# Patient Record
Sex: Female | Born: 1950 | Race: White | Hispanic: No | Marital: Married | Smoking: Never smoker
Health system: Southern US, Community
[De-identification: ages and names within clinical notes are randomized; demographics above are authoritative.]

## PROBLEM LIST (undated history)

## (undated) DIAGNOSIS — I2510 Atherosclerotic heart disease of native coronary artery without angina pectoris: Secondary | ICD-10-CM

## (undated) DIAGNOSIS — M1990 Unspecified osteoarthritis, unspecified site: Secondary | ICD-10-CM

## (undated) DIAGNOSIS — I10 Essential (primary) hypertension: Secondary | ICD-10-CM

## (undated) DIAGNOSIS — E7800 Pure hypercholesterolemia, unspecified: Secondary | ICD-10-CM

## (undated) DIAGNOSIS — R51 Headache: Secondary | ICD-10-CM

## (undated) DIAGNOSIS — I219 Acute myocardial infarction, unspecified: Secondary | ICD-10-CM

## (undated) DIAGNOSIS — M545 Low back pain, unspecified: Secondary | ICD-10-CM

## (undated) DIAGNOSIS — K219 Gastro-esophageal reflux disease without esophagitis: Secondary | ICD-10-CM

## (undated) DIAGNOSIS — G8929 Other chronic pain: Secondary | ICD-10-CM

## (undated) DIAGNOSIS — R519 Headache, unspecified: Secondary | ICD-10-CM

## (undated) DIAGNOSIS — Z87442 Personal history of urinary calculi: Secondary | ICD-10-CM

## (undated) DIAGNOSIS — L309 Dermatitis, unspecified: Secondary | ICD-10-CM

## (undated) DIAGNOSIS — C44311 Basal cell carcinoma of skin of nose: Secondary | ICD-10-CM

## (undated) HISTORY — PX: BASAL CELL CARCINOMA EXCISION: SHX1214

## (undated) HISTORY — PX: COLONOSCOPY: SHX174

## (undated) HISTORY — PX: DILATION AND CURETTAGE OF UTERUS: SHX78

## (undated) HISTORY — PX: WISDOM TOOTH EXTRACTION: SHX21

## (undated) HISTORY — PX: VAGINAL HYSTERECTOMY: SUR661

---

## 1989-12-05 HISTORY — PX: BREAST DUCTAL SYSTEM EXCISION: SHX5242

## 1994-12-05 HISTORY — PX: CARPAL TUNNEL RELEASE: SHX101

## 2000-09-28 ENCOUNTER — Encounter: Admission: RE | Admit: 2000-09-28 | Discharge: 2000-09-28 | Payer: Self-pay | Admitting: Obstetrics and Gynecology

## 2000-09-28 ENCOUNTER — Encounter: Payer: Self-pay | Admitting: Obstetrics and Gynecology

## 2000-12-05 HISTORY — PX: CYST EXCISION: SHX5701

## 2001-10-08 ENCOUNTER — Encounter: Admission: RE | Admit: 2001-10-08 | Discharge: 2001-10-08 | Payer: Self-pay | Admitting: Obstetrics and Gynecology

## 2001-10-08 ENCOUNTER — Encounter: Payer: Self-pay | Admitting: Obstetrics and Gynecology

## 2001-12-26 ENCOUNTER — Encounter (INDEPENDENT_AMBULATORY_CARE_PROVIDER_SITE_OTHER): Payer: Self-pay | Admitting: Specialist

## 2001-12-26 ENCOUNTER — Ambulatory Visit (HOSPITAL_BASED_OUTPATIENT_CLINIC_OR_DEPARTMENT_OTHER): Admission: RE | Admit: 2001-12-26 | Discharge: 2001-12-26 | Payer: Self-pay | Admitting: Orthopedic Surgery

## 2002-07-09 ENCOUNTER — Encounter (INDEPENDENT_AMBULATORY_CARE_PROVIDER_SITE_OTHER): Payer: Self-pay | Admitting: *Deleted

## 2002-07-09 ENCOUNTER — Observation Stay (HOSPITAL_COMMUNITY): Admission: RE | Admit: 2002-07-09 | Discharge: 2002-07-10 | Payer: Self-pay | Admitting: Obstetrics and Gynecology

## 2003-04-02 ENCOUNTER — Encounter: Payer: Self-pay | Admitting: Obstetrics and Gynecology

## 2003-04-02 ENCOUNTER — Encounter: Admission: RE | Admit: 2003-04-02 | Discharge: 2003-04-02 | Payer: Self-pay | Admitting: Obstetrics and Gynecology

## 2003-10-08 ENCOUNTER — Ambulatory Visit (HOSPITAL_COMMUNITY): Admission: RE | Admit: 2003-10-08 | Discharge: 2003-10-08 | Payer: Self-pay | Admitting: Gastroenterology

## 2003-10-08 ENCOUNTER — Encounter (INDEPENDENT_AMBULATORY_CARE_PROVIDER_SITE_OTHER): Payer: Self-pay | Admitting: Specialist

## 2004-04-07 ENCOUNTER — Ambulatory Visit (HOSPITAL_COMMUNITY): Admission: RE | Admit: 2004-04-07 | Discharge: 2004-04-07 | Payer: Self-pay | Admitting: Obstetrics and Gynecology

## 2004-12-28 ENCOUNTER — Ambulatory Visit (HOSPITAL_COMMUNITY): Admission: RE | Admit: 2004-12-28 | Discharge: 2004-12-28 | Payer: Self-pay | Admitting: Gastroenterology

## 2004-12-28 ENCOUNTER — Encounter (INDEPENDENT_AMBULATORY_CARE_PROVIDER_SITE_OTHER): Payer: Self-pay | Admitting: *Deleted

## 2005-04-25 ENCOUNTER — Ambulatory Visit (HOSPITAL_COMMUNITY): Admission: RE | Admit: 2005-04-25 | Discharge: 2005-04-25 | Payer: Self-pay | Admitting: Obstetrics and Gynecology

## 2005-10-07 ENCOUNTER — Encounter: Admission: RE | Admit: 2005-10-07 | Discharge: 2005-10-07 | Payer: Self-pay | Admitting: Internal Medicine

## 2005-10-21 ENCOUNTER — Encounter: Admission: RE | Admit: 2005-10-21 | Discharge: 2005-10-21 | Payer: Self-pay | Admitting: Internal Medicine

## 2006-04-04 HISTORY — PX: CYSTOSCOPY W/ STONE MANIPULATION: SHX1427

## 2006-11-14 ENCOUNTER — Ambulatory Visit (HOSPITAL_COMMUNITY): Admission: RE | Admit: 2006-11-14 | Discharge: 2006-11-14 | Payer: Self-pay | Admitting: Obstetrics and Gynecology

## 2007-03-10 ENCOUNTER — Emergency Department (HOSPITAL_COMMUNITY): Admission: EM | Admit: 2007-03-10 | Discharge: 2007-03-10 | Payer: Self-pay | Admitting: Emergency Medicine

## 2007-04-13 ENCOUNTER — Ambulatory Visit (HOSPITAL_BASED_OUTPATIENT_CLINIC_OR_DEPARTMENT_OTHER): Admission: RE | Admit: 2007-04-13 | Discharge: 2007-04-13 | Payer: Self-pay | Admitting: Urology

## 2007-09-02 ENCOUNTER — Emergency Department (HOSPITAL_COMMUNITY): Admission: EM | Admit: 2007-09-02 | Discharge: 2007-09-02 | Payer: Self-pay | Admitting: Family Medicine

## 2008-01-09 ENCOUNTER — Ambulatory Visit (HOSPITAL_COMMUNITY): Admission: RE | Admit: 2008-01-09 | Discharge: 2008-01-09 | Payer: Self-pay | Admitting: Obstetrics and Gynecology

## 2010-12-25 ENCOUNTER — Encounter: Payer: Self-pay | Admitting: Internal Medicine

## 2011-04-22 NOTE — H&P (Signed)
NAME:  [name] [name], [name] [name]                         ACCOUNT NO.:  [id number]   MEDICAL RECORD NO.:  [id number]                   PATIENT TYPE:  INP   LOCATION:  0462                                 FACILITY:  [facility]   PHYSICIAN:  Katherine Roan, M.D.               DATE OF BIRTH:  11-Oct-1951   DATE OF ADMISSION:  07/09/2002  DATE OF DISCHARGE:  07/10/2002                                HISTORY & PHYSICAL   PREOPERATIVE DIAGNOSIS:  Continued heavy painful periods.   HISTORY OF PRESENT ILLNESS:  The patient is a 60 year old gravida 2, para 2  female who has had trouble with heavy painful periods despite oral  contraceptives and has been advised to go off her birth control pills  because of hypertensive issues.  In addition to that, she is on Darvon and  Lipitor.   PAST SURGICAL HISTORY:  1. Two normal deliveries.  2. Cyst removed from a finger.  3. Carpal tunnel.  4. Back several years ago she had a diagnostic laparoscopy.   ALLERGIES:  SULFA.   REVIEW OF SYSTEMS:  She wears glasses but notes no dizziness, no decrease in  visual or auditory acuity.  No headaches.  HEART:  She denies chest pain,  shortness of breath.  No history of mitral valve prolapse or heart murmur.  She has been followed for hypertension.  LUNGS:  No chronic cough.  No  asthma.  No hemoptysis.  GENITOURINARY:  She denies stress incontinence.  No  history of UTIs.  No frequency.  GASTROINTESTINAL:  No bowel habit change.  No anorexia.  No melena.  MUSCLES, BONES, AND JOINTS:  She was treated for  carpal tunnel on the right.  Is doing very well.   SOCIAL HISTORY:  She works for [employer].  Denies  smoking or drinking or alcohol intake.   FAMILY HISTORY:  Her mother is living and well at 63.  Her father died from  heart disease at age 91.  She has two brothers who are living and well.  Her  maternal grandfather has lung cancer, and a maternal aunt had malignancy in  the bone.   PHYSICAL  EXAMINATION:  GENERAL:  Well-developed, nourished female who is  alert, oriented to time, place and recent events.  Appears to be her stated  age of 80.  VITAL SIGNS:  Weight is 189.  Blood pressure 140/80.  HEENT:  Examination of the ears, nose, and throat unremarkable.  Oropharynx  is not injected.  NECK:  Supple.  Carotid pulses are equal, without bruits.  Thyroid is in the  midline.  LUNGS:  Clear to P&A.  BREASTS:  No masses or tenderness.  ABDOMEN:  Soft and flat.  Liver, spleen, and kidneys are not palpated.  Bowel sounds are normal.  No bruits are heard.  PELVIC:  Normal vulva and vagina.  Cervix is clean.  Uterus is anterior.  Adnexa  negative, without masses.  Pap smear are benign.  RECTOVAGINAL:  Confirms.  EXTREMITIES:  Show good range of motion.  Equal pulses and reflexes.   IMPRESSION:  Persistent menorrhagia and dysmenorrhea.   PLAN:  Vaginal hysterectomy, possible abdominal.  Risks, benefits have been  discussed with the patient at length.                                               Katherine Roan, M.D.    SDM/MEDQ  D:  07/08/2002  T:  07/12/2002  Job:  [id number]

## 2011-04-22 NOTE — Op Note (Signed)
[name] [name], [name] [name]               ACCOUNT NO.:  [id number]   MEDICAL RECORD NO.:  [id number]          PATIENT TYPE:  AMB   LOCATION:  NESC                         FACILITY:  [facility]   PHYSICIAN:  Mark C. Vernie Ammons, M.D.  DATE OF BIRTH:  [birth year]   DATE OF PROCEDURE:  04/13/2007  DATE OF DISCHARGE:                               OPERATIVE REPORT   PREOPERATIVE DIAGNOSIS:  Left ureteral calculus.   POSTOPERATIVE DIAGNOSIS:  Left ureteral calculus.   PROCEDURE:  Cystoscopy, left retrograde pyelogram with interpretation,  left ureteroscopy and stone extraction with double-J stent placement.   SURGEON:  Mark C. Vernie Ammons, M.D.   ANESTHESIA:  General.   SPECIMENS:  Stone given to the patient.   DRAINS:  4.8 French 24 cm contour stent with string affixed.   BLOOD LOSS:  None.   COMPLICATIONS:  None.   INDICATIONS:  The patient is a 60 year old white female with a left  distal ureteral stone that has failed to progress.  She is brought to  the OR for ureteroscopic extraction.  We discussed the risks,  complications and alternatives.   DESCRIPTION OF OPERATION:  After informed consent, the patient was taken  to the major OR, placed on the table and identified.  It was confirmed  the procedure was to be performed on the left side and she was then  administered general anesthesia and moved to the dorsal lithotomy  position.  Her genitalia was sterilely prepped and draped and a 22-  French cystoscope with 12 degrees lens was inserted in the bladder.  The  bladder was fully inspected and noted to be free of any tumor, stones,  or inflammatory lesions.  The left orifice was then identified.   A 6-French open ended ureteral catheter was then passed in the left  orifice and retrograde pyelogram was performed in standard fashion using  full strength contrast injected through the open ended catheter.  This  was performed under direct fluoroscopy and revealed a filling defect in  the distal  ureter consistent with a stone. Proximal to this, the ureter  appeared normal without lesion or filling defect.  The intrarenal  collecting system was also noted to be normal with no mass effect or  filling defect.   A 6-French rigid ureteroscope was then passed into the bladder under  direct visualization and into the left ureteral orifice.  The stone was  visualized, grasped with nitinol basket, and extracted without  difficulty.  The cystoscope was reinserted and a 0.038 inch floppy tip  guidewire was then passed up the left ureter under direct fluoroscopic  control into the renal pelvis and the stent was passed over the  guidewire.  The guidewire was removed with a good curl being noted in  the renal pelvis and in the bladder.  The bladder was drained, the  cystoscope removed, and the string on the distal aspect of the stent  affixed to the patient's inner thigh.  She was awakened and taken to the  recovery room in stable satisfactory condition.  She tolerated the  procedure well with no intraoperative complications.  She will be given a prescription for Vicoprofen #30 and Vesicare 10 mg  #5.  She will follow-up in my office in four days for stent removal.      Mark C. Vernie Ammons, M.D.  Electronically Signed     MCO/MEDQ  D:  04/13/2007  T:  04/13/2007  Job:  [id number]

## 2011-04-22 NOTE — Discharge Summary (Signed)
   NAMEDORCAS, MELITO                           ACCOUNT NO.:  [id number]   MEDICAL RECORD NO.:  [id number]                    PATIENT TYPE:   LOCATION:                                       FACILITY:   PHYSICIAN:  Katherine Roan, M.D.               DATE OF BIRTH:   DATE OF ADMISSION:  07/10/2002  DATE OF DISCHARGE:  07/12/2002                                 DISCHARGE SUMMARY   ADMISSION DIAGNOSIS:  Severe dysmenorrhea.   POSTOPERATIVE DIAGNOSES:  1. Severe dysmenorrhea.  2. Adenomyosis.   HISTORY OF PRESENT ILLNESS:  The patient is a 60 year old female who has  trouble with heavy painful periods.  She had been fairly well controlled on  oral contraceptives.  However, because of her blood pressure, she was  advised to have these removed.   She was also on Darvon and Lipitor.  She had a history of diagnostic  laparoscopy.   PHYSICAL EXAMINATION:  General physical examination was done on admission  and found to be within normal limits.   LABORATORY DATA:  Admission hemoglobin was 13.  Creatinine and BUN were  normal.   HOSPITAL COURSE:  The patient was admitted to the hospital and underwent an  uneventful vaginal hysterectomy.  The following day was discharged to home  and office care.  She was asked to call for fever, bleeding, or any other  problems she encountered.   CONDITION ON DISCHARGE:  Improved.                                                Katherine Roan, M.D.    SDM/MEDQ  D:  08/11/2002  T:  08/11/2002  Job:  [id number]

## 2011-04-22 NOTE — Op Note (Signed)
[name], [name]               ACCOUNT NO.:  [id number]   MEDICAL RECORD NO.:  [id number]          PATIENT TYPE:  AMB   LOCATION:  ENDO                         FACILITY:  [facility]   PHYSICIAN:  Petra Kuba, M.D.    DATE OF BIRTH:  07/24/1951   DATE OF PROCEDURE:  12/28/2004  DATE OF DISCHARGE:                                 OPERATIVE REPORT   PROCEDURE PERFORMED:  Colonoscopy with polypectomy.   ENDOSCOPIST:  Petra Kuba, M.D.   INDICATIONS FOR PROCEDURE:  Patient with history of significant polyp, due  for repeat screening.  Consent was signed after the risks, benefits, methods  and options were thoroughly discussed with the patient on multiple times in  the past in the office.   MEDICINES USED:  Demerol 100 mg, Versed 10 mg.   DESCRIPTION OF PROCEDURE:  Rectal inspection was pertinent for external  hemorrhoids, small.  Digital exam was negative.  A video pediatric  adjustable colonoscope was inserted and with minimal difficulty due to some  looping with abdominal pressure, was able to be advanced around the colon to  the cecum.  On insertion, no obvious abnormalities were seen.  The cecum was  identified by the appendiceal orifice and the ileocecal valve.  The prep was  adequate.  There was some liquid stool that required washing and suctioning.  On slow withdrawal through the colon, the cecum, ascending, transverse and  descending were normal.  As the scope was withdrawn around the left side of  the colon, in the distal sigmoid and rectum, three tiny probable  hyperplastic appearing polyps were seen and were all hot biopsied times one  and put in the first container.  In the distal sigmoid there was some  erythema, probably her previous polypectomy site and a few cold biopsies of  that were obtained and put in a second container.  Once back in the rectum,  anorectal pull-through and retroflexion confirmed some small hemorrhoids.  Scope was straightened and readvanced a  short ways up the left side of the  colon, air was suctioned, scope removed.  The patient tolerated the  procedure well.  There was no immediate obvious complication.   ENDOSCOPIC DIAGNOSIS:  1.  Internal and external small hemorrhoids.  2.  Three tiny rectal and distal sigmoid polyps hot biopsied.  3.  Distal sigmoid erythema, probably her old polypectomy site, status post      cold biopsy.  4.  Otherwise within normal limits to the cecum.   PLAN:  Await pathology to determine future colonic screening.  Otherwise  happy to see back p.r.n. and return care to Dr. Felipa Eth for the customary  health care maintenance to include yearly rectals and guaiacs.      MEM/MEDQ  D:  12/28/2004  T:  12/28/2004  Job:  [id number]   cc:   Larina Earthly, M.D.  [address]  [place]  [place] [address]  Fax: [phone]

## 2011-04-22 NOTE — Op Note (Signed)
NAME:  [name] [name], [name] [name]                         ACCOUNT NO.:  [id number]   MEDICAL RECORD NO.:  [id number]                   PATIENT TYPE:  AMB   LOCATION:  ENDO                                 FACILITY:  [facility]   PHYSICIAN:  Petra Kuba, M.D.                 DATE OF BIRTH:  05/27/1951   DATE OF PROCEDURE:  10/08/2003  DATE OF DISCHARGE:                                 OPERATIVE REPORT   PROCEDURE:  Colonoscopy with polypectomy.   INDICATIONS FOR PROCEDURE:  Guaiac positivity, family history of colon  polyp. Consent was signed after risks, benefits, methods, and options were  thoroughly discussed in the office.   MEDICINES USED:  Demerol 100, Versed 10.   DESCRIPTION OF PROCEDURE:  Rectal inspection was pertinent for small  external hemorrhoids. Digital exam was negative. The pediatric video  adjustable colonoscope was inserted. There was a little blood in the distal  colon and in the mid sigmoid a large pedunculated polyp was seen. We were  able to advance the scope around this and with abdominal pressure advance to  the cecum. No blood was seen above the polyp. The cecum was identified by  the appendiceal orifice and the ileocecal valve. In fact, the scope was  inserted a short ways into the terminal ileum which was normal. Photo  documentation was obtained.  No blood was seen in the TI, the scope was  slowly withdrawn. The prep was adequate. There was some liquid stool that  required washing and suctioning. The cecum, ascending and transverse were  all normal. In the mid descending, a tiny polyp was seen and was hot  biopsied x1. The scope was then withdrawn back to the mid sigmoid where the  large pedunculated polyp was seen, snared, electrocautery applied and the  polyp was removed. We went ahead and resnared the polyp and it was withdrawn  through the rectum. The scope was reinserted back up the sigmoid into the  polypectomy site and possibly there was some polypoid tissue  left on the  stalk and the stalk and residual polyp was resnared, electrocautery applied  and this part of the stalk and polyp was removed, suctioned onto the head of  the scope and this was recovered as well.  The scope was reinserted. Again  there was a little erythema on the stalk, I do not think this was residual  polyp but we went ahead and took two hot biopsies of this area and put it in  a separate container to make sure there was no residual polypoid tissue. The  scope was then slowly withdrawn. No additional findings were seen as we  slowly withdrew back to the rectum. Anal rectal pull through and  retroflexion confirmed some small hemorrhoids. The scope was reinserted pass  the polypectomy site a short ways up the left side of the colon, air was  suctioned, scope removed. The patient  tolerated the procedure well. There  was no obvious or immediate complications.   ENDOSCOPIC DIAGNOSIS:  1. Internal and external hemorrhoids.  2. Large pedunculated hemorrhagic polyp status post two snares and hot     biopsy of the stalk to rule out any residual polyp.  3. Tiny descending polyp hot biopsied.  4. Otherwise within normal limits to the terminal ileum.    PLAN:  Await pathology to determine future colonic screening. Will go ahead  and repeat guaiacs in one month and decide further workup and plans at that  juncture and see her back probably in two months after guaiacs to recheck  symptoms and make sure no further workup plans are needed.                                               Petra Kuba, M.D.    MEM/MEDQ  D:  10/08/2003  T:  10/08/2003  Job:  [id number]   cc:   S. Kyra Manges, M.D.  [phone]  [place]  [place] [address]  Fax: [phone]

## 2011-04-22 NOTE — Op Note (Signed)
   TNAMESHER, SHAMPINE                        ACCOUNT NO.:  [id number]   MEDICAL RECORD NO.:  [id number]                   PATIENT TYPE:  INP   LOCATION:  NA                                   FACILITY:  [facility]   PHYSICIAN:  Katherine Roan, M.D.               DATE OF BIRTH:  1951/07/16   DATE OF PROCEDURE:  07/09/2002  DATE OF DISCHARGE:                                 OPERATIVE REPORT   OPERATION PERFORMED:  Vaginal hysterectomy.  Pelvic exam under anesthesia.   PREOPERATIVE DIAGNOSIS:  Persistent menorrhagia and dysmenorrhea.   POSTOPERATIVE DIAGNOSIS:  Persistent menorrhagia and dysmenorrhea.   DESCRIPTION OF PROCEDURE:  The patient was placed in lithotomy position with  candy-cane stirrups.  Examination under anesthesia revealed an anterior  normal-size uterus.  There were no pelvic masses.  The patient was then  prepped and draped in the usual fashion, cervix grasped with a tenaculum.  It was quite well-supported.  Cul-de-sac posteriorly was entered with sharp  dissection.  The uterosacral and cardinals were then skeletonized and  ligated with 0 chromic.  The anterior vagina was then incised, and the  uterine vessels were clamped.  Following this, the vesicouterine peritoneum  was identified and entered and the upper broad ligament, including the round  ligament on each side, were clamped and ligated with 0 chromic suture.  Hemostasis was secure.  The specimen was retroverted, and the uteroovarian  anastomosis and tube were clamped and ligated with 0 chromic tie and suture.  There was a bleeder in the left side of the broad ligament which was clamped  with a Heaney clamp and ligated.  Hemostasis was secure from that  standpoint.  We then plicated the uterosacral ligaments in the midline for  vault support and closed the vagina with a continuation of the peritoneal  suture.  Peritoneal suture was closed with 2-0 PDS pursestring type.  Following this, the vagina was opposed as  well.  The posterior vagina and  uterosacral ligaments all were closed in the vertical plane with figure-of-  eight sutures of 0 Vicryl.  Hemostasis was quite secure.  Bladder was  drained of about 150 cc of slightly concentrated but clear urine.  No  unusual blood loss occurred at the time of the procedure.  Estimated blood  loss was about 100-200 cc.  The patient tolerated the procedure well.                                               Katherine Roan, M.D.    SDM/MEDQ  D:  07/09/2002  T:  07/12/2002  Job:  [id number]

## 2011-04-22 NOTE — Op Note (Signed)
[place]  Patient:    [name] [name], [name] [name] Visit Number: [id number] MRN: [id number]          Service Type: DSU Location: [facility] Attending Physician:  Ronne Binning Dictated by:   Nicki Reaper, M.D. Proc. Date: 12/26/01 Admit Date:  12/26/2001                             Operative Report  PREOPERATIVE DIAGNOSIS:  Mucoid cyst, left index finger.  POSTOPERATIVE DIAGNOSIS:  Mucoid cyst, left index finger.  OPERATION:  Excision mucoid cyst, left index finger; debridement distal interphalangeal joint.  SURGEON:   Nicki Reaper, M.D.  ANESTHESIA:  IV regional forearm-based.  DATE OF OPERATION:  December 26, 2001  ANESTHESIOLOGIST:  Maren Beach, M.D.  HISTORY:  The patient is a 60 year old female with a history of a mass on the dorsal aspect of her index finger with degenerative changes in the distal interphalangeal joint.  PROCEDURE:  The patient was brought to the operating room where a forearm-based IV regional anesthetic was carried out without difficulty.  She was prepped and draped using Betadine scrub and solution with the left arm free.  A curvilinear incision was made over the mass, carried down through subcutaneous tissue, bleeders were electrocauterized.  The cyst was immediately encountered; this was multiloculated.  With blunt and sharp dissection this was dissected free.  An exostosis was present on the proximal portion of the distal phalanx and a small one on the distal portion of the middle phalanx.  This was removed with the rongeur.  A cyst was present within the bone of the distal phalanx and this was removed.  The ulnar side was then inspected.  No significant exostosis were present.  The wound was irrigated. The skin was closed with interrupted 5-0 nylon sutures.  A sterile compressive dressing and splint were applied to the finger.  The patient tolerated the procedure well and was taken to the recovery room for  observation in satisfactory condition.  DISPOSITION:  She is discharged home to return to [facility] [place] in one week on Vicodin and Keflex. Dictated by:   Nicki Reaper, M.D. Attending Physician:  Ronne Binning DD:  12/26/01 TD:  12/26/01 Job: [id number]

## 2012-01-18 ENCOUNTER — Other Ambulatory Visit: Payer: Self-pay | Admitting: Obstetrics & Gynecology

## 2012-01-18 DIAGNOSIS — R928 Other abnormal and inconclusive findings on diagnostic imaging of breast: Secondary | ICD-10-CM

## 2012-01-26 ENCOUNTER — Ambulatory Visit
Admission: RE | Admit: 2012-01-26 | Discharge: 2012-01-26 | Disposition: A | Discharge status: Home / Self Care | Payer: BC Managed Care – PPO | Source: Ambulatory Visit | Attending: Obstetrics & Gynecology | Admitting: Obstetrics & Gynecology

## 2012-01-26 DIAGNOSIS — R928 Other abnormal and inconclusive findings on diagnostic imaging of breast: Secondary | ICD-10-CM

## 2013-02-08 ENCOUNTER — Emergency Department (INDEPENDENT_AMBULATORY_CARE_PROVIDER_SITE_OTHER): Payer: BC Managed Care – PPO

## 2013-02-08 ENCOUNTER — Emergency Department (HOSPITAL_COMMUNITY)
Admission: EM | Admit: 2013-02-08 | Discharge: 2013-02-08 | Disposition: A | Discharge status: Home / Self Care | Payer: BC Managed Care – PPO | Source: Home / Self Care

## 2013-02-08 ENCOUNTER — Encounter (HOSPITAL_COMMUNITY): Payer: Self-pay

## 2013-02-08 DIAGNOSIS — J069 Acute upper respiratory infection, unspecified: Secondary | ICD-10-CM

## 2013-02-08 HISTORY — DX: Essential (primary) hypertension: I10

## 2013-02-08 HISTORY — DX: Pure hypercholesterolemia, unspecified: E78.00

## 2013-02-08 MED ORDER — AZITHROMYCIN 250 MG PO TABS: 250.0000 mg | ORAL_TABLET | Freq: Every day | ORAL | Status: DC

## 2013-02-08 NOTE — ED Provider Notes (Signed)
History     CSN: [id number]  Arrival date & time 02/08/13  1141   First MD Initiated Contact with Patient 02/08/13 1225      Chief Complaint  Patient presents with  . Nasal Congestion    (Consider location/radiation/quality/duration/timing/severity/associated sxs/prior treatment) HPI Comments: -year-old female is accompanied by her husband with complaints of copious PND, sinus congestion and tightness in her upper chest. She thought she may have felt a fever last p.m. but not taken. She is equivocal when responding to whether she has shortness of breath or not. She denies earache or sore throat. For allergies she takes Palgia (OTC antihistamine) and Astelin nasal spray twice a day. She is complaining of fatigue and malaise and feeling poorly in general.   Past Medical History  Diagnosis Date  . High cholesterol   . Hypertension   . Arthritis     Past Surgical History  Procedure Laterality Date  . Abdominal hysterectomy      History reviewed. No pertinent family history.  History  Substance Use Topics  . Smoking status: Not on file  . Smokeless tobacco: Not on file  . Alcohol Use: Not on file    OB History   Grav Para Term Preterm Abortions TAB SAB Ect Mult Living                  Review of Systems  Constitutional: Positive for activity change and fatigue. Negative for fever, chills and appetite change.  HENT: Positive for congestion, rhinorrhea, voice change and postnasal drip. Negative for ear pain, sore throat, facial swelling, neck pain and neck stiffness.   Eyes: Negative.   Respiratory: Negative.   Cardiovascular: Negative.   Gastrointestinal: Negative.   Genitourinary: Negative.   Skin: Negative for pallor and rash.  Neurological: Negative.     Allergies  Altace; Benicar; and Sulfa antibiotics  Home Medications   Current Outpatient Rx  Name  Route  Sig  Dispense  Refill  . aspirin 81 MG tablet   Oral   Take 81 mg by mouth daily.         Marland Kitchen  azelastine (ASTELIN) 137 MCG/SPRAY nasal spray   Nasal   Place 1 spray into the nose 2 (two) times daily. Use in each nostril as directed         . estradiol (ESTRACE) 2 MG tablet   Oral   Take 2 mg by mouth daily.         . fenofibrate 160 MG tablet   Oral   Take 160 mg by mouth daily.         . fish oil-omega-3 fatty acids 1000 MG capsule   Oral   Take 2 g by mouth daily.         Marland Kitchen glucosamine-chondroitin 500-400 MG tablet   Oral   Take 1 tablet by mouth 3 (three) times daily.         . meloxicam (MOBIC) 15 MG tablet   Oral   Take 15 mg by mouth daily.         Marland Kitchen omeprazole (PRILOSEC) 20 MG capsule   Oral   Take 20 mg by mouth daily.         . rosuvastatin (CRESTOR) 20 MG tablet   Oral   Take 20 mg by mouth daily.         . valsartan (DIOVAN) 160 MG tablet   Oral   Take 160 mg by mouth daily.         Marland Kitchen  azithromycin (ZITHROMAX) 250 MG tablet   Oral   Take 1 tablet (250 mg total) by mouth daily. 2 tabs po on day one, then one tablet po once daily on days 2-5.   6 tablet   0     BP 156/81  Pulse 74  Temp(Src) 99.4 F (37.4 C) (Oral)  Resp 18  SpO2 93%  Physical Exam  Nursing note and vitals reviewed. Constitutional: She is oriented to person, place, and time. She appears well-developed and well-nourished. No distress.  HENT:  Right Ear: External ear normal.  Left Ear: External ear normal.  Mouth/Throat: No oropharyngeal exudate.  OP with minor erythema and copious PND.  Eyes: Conjunctivae and EOM are normal.  Neck: Normal range of motion. Neck supple.  Cardiovascular: Normal rate, regular rhythm and normal heart sounds.   Pulmonary/Chest: Effort normal and breath sounds normal. No respiratory distress. She has no wheezes. She has no rales.  Musculoskeletal: Normal range of motion. She exhibits no edema.  Lymphadenopathy:    She has no cervical adenopathy.  Neurological: She is alert and oriented to person, place, and time.  Skin: Skin  is warm and dry. No rash noted.  Psychiatric: She has a normal mood and affect.    ED Course  Procedures (including critical care time)  Labs Reviewed - No data to display Dg Chest 2 View  02/08/2013  *RADIOLOGY REPORT*  Clinical Data: Cough, dyspnea, low oxygen saturation, history hypertension  CHEST - 2 VIEW  Comparison: None  Findings: Borderline enlargement of cardiac silhouette. Mediastinal contours and pulmonary vascularity normal. Lungs clear. No pleural effusion or pneumothorax. Minimal peribronchial thickening centrally. Bones unremarkable.  IMPRESSION: Borderline enlargement of cardiac silhouette. Minimal bronchitic changes.   Original Report Authenticated By: Ulyses Southward, M.D.      1. URI (upper respiratory infection)   2. PND (post-nasal drip)   3. Bronchitis       MDM  CXR  She was advised to take Chlor-Trimeton for drainage and use nasal saline. She states that neither one of them has ever helped her before and declines to use them. She st most other antihistamines do not help Mucinex or Robitussin, Sudafed PE 10mg  prn congestion. .  Z pack Follow with your doctor next week if needed          Hayden Rasmussen, NP 02/08/13 1353

## 2013-02-08 NOTE — ED Notes (Signed)
Husband ill w URI last week, now better; pt now experiencing same syx for past few days

## 2013-02-13 NOTE — ED Provider Notes (Signed)
Medical screening examination/treatment/procedure(s) were performed by resident physician or non-physician practitioner and as supervising physician I was immediately available for consultation/collaboration.   KINDL,JAMES DOUGLAS MD.   James D Kindl, MD 02/13/13 2027 

## 2014-03-04 ENCOUNTER — Other Ambulatory Visit: Payer: Self-pay | Admitting: Urology

## 2014-03-05 ENCOUNTER — Encounter (HOSPITAL_COMMUNITY): Payer: Self-pay | Admitting: *Deleted

## 2014-03-05 HISTORY — PX: EXTRACORPOREAL SHOCK WAVE LITHOTRIPSY: SHX1557

## 2014-03-05 NOTE — Progress Notes (Signed)
Spoke to patient via phone,history obtained,updated.  Bring blue folder,insurance cards,picture ID,designated driver Reinforced no aspirin(instructions to hold aspirin per your doctor), ibuprofen products 72 hours prior to procedure.  No vitamins or herbal medicines 7 days prior to procedure.   Follow laxative instructions provided by urologist (office) and in blue folder.  Wear easy on/off clothing and no jewelry except wedding rings and ear rings. Leave all other valuables at home. Verbalizes understanding of instructions  No alcohol 24 hours prior to procedure.call md office if you have a cold,sorethroat or fever. Shower or bathe before your treatment. You may have clear liquids up to 4 hours prior to treatment,NO SOLIDS 6 hours prior to treatment.. Pt verbalized understanding

## 2014-03-07 ENCOUNTER — Encounter (HOSPITAL_COMMUNITY): Payer: Self-pay | Admitting: Pharmacy Technician

## 2014-03-10 ENCOUNTER — Ambulatory Visit (HOSPITAL_COMMUNITY): Payer: BC Managed Care – PPO

## 2014-03-10 ENCOUNTER — Encounter (HOSPITAL_COMMUNITY): Payer: Self-pay | Admitting: *Deleted

## 2014-03-10 ENCOUNTER — Encounter (HOSPITAL_COMMUNITY)
Admission: RE | Disposition: A | Discharge status: Home / Self Care | Payer: Self-pay | Source: Ambulatory Visit | Attending: Urology

## 2014-03-10 ENCOUNTER — Ambulatory Visit (HOSPITAL_COMMUNITY)
Admission: RE | Admit: 2014-03-10 | Discharge: 2014-03-10 | Disposition: A | Discharge status: Home / Self Care | Payer: BC Managed Care – PPO | Source: Ambulatory Visit | Attending: Urology | Admitting: Urology

## 2014-03-10 DIAGNOSIS — Z7982 Long term (current) use of aspirin: Secondary | ICD-10-CM | POA: Insufficient documentation

## 2014-03-10 DIAGNOSIS — Z85828 Personal history of other malignant neoplasm of skin: Secondary | ICD-10-CM | POA: Insufficient documentation

## 2014-03-10 DIAGNOSIS — N1330 Unspecified hydronephrosis: Secondary | ICD-10-CM | POA: Insufficient documentation

## 2014-03-10 DIAGNOSIS — N201 Calculus of ureter: Secondary | ICD-10-CM | POA: Insufficient documentation

## 2014-03-10 DIAGNOSIS — E7800 Pure hypercholesterolemia, unspecified: Secondary | ICD-10-CM | POA: Insufficient documentation

## 2014-03-10 DIAGNOSIS — R310 Gross hematuria: Secondary | ICD-10-CM | POA: Insufficient documentation

## 2014-03-10 DIAGNOSIS — Z79899 Other long term (current) drug therapy: Secondary | ICD-10-CM | POA: Insufficient documentation

## 2014-03-10 DIAGNOSIS — N200 Calculus of kidney: Secondary | ICD-10-CM | POA: Insufficient documentation

## 2014-03-10 DIAGNOSIS — I10 Essential (primary) hypertension: Secondary | ICD-10-CM | POA: Insufficient documentation

## 2014-03-10 DIAGNOSIS — Z90710 Acquired absence of both cervix and uterus: Secondary | ICD-10-CM | POA: Insufficient documentation

## 2014-03-10 SURGERY — LITHOTRIPSY, ESWL
Anesthesia: LOCAL | Laterality: Left

## 2014-03-10 MED ORDER — DIAZEPAM 5 MG PO TABS
10.0000 mg | ORAL_TABLET | ORAL | Status: AC
  Administered 2014-03-10: 10 mg via ORAL
  Filled 2014-03-10: qty 2

## 2014-03-10 MED ORDER — CIPROFLOXACIN HCL 500 MG PO TABS
500.0000 mg | ORAL_TABLET | ORAL | Status: AC
Start: 2014-03-10 — End: 2014-03-10
  Administered 2014-03-10: 500 mg via ORAL
  Filled 2014-03-10: qty 1

## 2014-03-10 MED ORDER — SODIUM CHLORIDE 0.9 % IV SOLN
INTRAVENOUS | Status: DC
  Administered 2014-03-10: 15:00:00 via INTRAVENOUS

## 2014-03-10 MED ORDER — OXYCODONE HCL 10 MG PO TABS: 10.0000 mg | ORAL_TABLET | ORAL | Status: DC | PRN

## 2014-03-10 MED ORDER — DIPHENHYDRAMINE HCL 25 MG PO CAPS
25.0000 mg | ORAL_CAPSULE | ORAL | Status: AC
  Administered 2014-03-10: 25 mg via ORAL
  Filled 2014-03-10: qty 1

## 2014-03-10 MED ORDER — TAMSULOSIN HCL 0.4 MG PO CAPS: 0.4000 mg | ORAL_CAPSULE | ORAL | Status: DC

## 2014-03-10 NOTE — H&P (Signed)
[name] [name] is a 63 year old female who returns for completion of her gross hematuria evaluation.   History of Present Illness Left nephrolithiasis: She was initially diagnosed with 2 nonobstructing left lower pole calculi in 1/06. She passed a small stone spontaneously in 4/08.   Left ureteroscopy and stone extraction 5/08.  Stone analysis: Calcium oxalate    Gross hematuria: She reports that for 4-5 months she has intermittent discomfort in the area of the urethra. This has been associated with some discomfort in the lower abdomen more to the right side than the left. It is not associated with any flank pain. She has however noted intermittent gross hematuria. When she was in to see her primary care physician blood was noted in her urine and was placed on an antibiotic. This resulted in resolution of her urethral discomfort but she continued to have intermittent gross hematuria as recently as last week.     Interval history: She is doing well with no new complaints and specifically denies any further gross hematuria. She is not having any flank pain.   Past Medical History Problems  1. History of Arthritis (V13.4) 2. History of hypercholesterolemia (V12.29) 3. History of hypertension (V12.59) 4. History of Skin Cancer (V10.83)  Surgical History Problems  1. History of Hysterectomy 2. History of Neuroplasty Decompression Median Nerve At Carpal Tunnel 3. History of Wrist Carpectomy  Current Meds 1. Aspirin 81 MG Oral Tablet;  Therapy: (Recorded:16Mar2015) to Recorded 2. Azelastine HCl SOLN;  Therapy: (Recorded:16Mar2015) to Recorded 3. Carbinoxamine Maleate TABS;  Therapy: (Recorded:16Mar2015) to Recorded 4. Crestor 20 MG Oral Tablet;  Therapy: (Recorded:16Mar2015) to Recorded 5. Cymbalta 30 MG Oral Capsule Delayed Release Particles;  Therapy: (Recorded:16Mar2015) to Recorded 6. Diovan 160 MG CAPS;  Therapy: (Recorded:16Mar2015) to Recorded 7. Estradiol 2 MG Oral  Tablet;  Therapy: (Recorded:16Mar2015) to Recorded 8. Fenofibrate 160 MG Oral Tablet;  Therapy: (Recorded:16Mar2015) to Recorded 9. Fish Oil 1000 MG Oral Capsule;  Therapy: (Recorded:16Mar2015) to Recorded 10. Meloxicam 15 MG Oral Tablet;   Therapy: (Recorded:16Mar2015) to Recorded 11. Multi For Her CAPS;   Therapy: (Recorded:16Mar2015) to Recorded 12. Omeprazole 20 MG Oral Capsule Delayed Release;   Therapy: (Recorded:16Mar2015) to Recorded 13. TraMADol HCl TABS;   Therapy: (Recorded:16Mar2015) to Recorded  Allergies Medication  1. Altace CAPS 2. Benicar TABS 3. Sulfa Drugs  Family History Problems  1. Family history of Deceased : Father 2. Family history of cardiac disorder (V17.49) : Father 3. Family history of Heart Disease : Father  Social History Problems  1. Denied: History of Alcohol use 2. Denied: History of Caffeine use 3. Marital History - Currently Married 4. Never smoker 5. Occupation:   Statistician 6. Two children   Review of Systems Genitourinary, constitutional, skin, eye, otolaryngeal, hematologic/lymphatic, cardiovascular, pulmonary, endocrine, musculoskeletal, gastrointestinal, neurological and psychiatric system(s) were reviewed and pertinent findings if present are noted.  Constitutional: feeling tired (fatigue).  ENT: sinus problems.  Musculoskeletal: back pain and joint pain.    Vitals Vital Signs surgery1 BSA Calculated: 1.91 Blood Pressure: 159 / 78 Heart Rate: 65  Physical Exam Constitutional: Well nourished and well developed . No acute distress.  ENT:. The ears and nose are normal in appearance.  Neck: The appearance of the neck is normal and no neck mass is present.  Pulmonary: No respiratory distress and normal respiratory rhythm and effort.  Cardiovascular: Heart rate and rhythm are normal . No peripheral edema.  Abdomen: The abdomen is soft and nontender. No masses are palpated. No CVA  tenderness. No hernias are palpable. No  hepatosplenomegaly noted.  Lymphatics: The femoral and inguinal nodes are not enlarged or tender.  Skin: Normal skin turgor, no visible rash and no visible skin lesions.  Neuro/Psych:. Mood and affect are appropriate.   AU CT-HEMATURIA PROTOCOL [id number] 12:00AM Kathie Rhodes  Test Name Result Flag Reference AU CT-HEMATURIA PROTOCOL (Report)   ** RADIOLOGY REPORT BY [facility] **   CLINICAL DATA: Gross hematuria. History of renal stones.  EXAM: CT ABDOMEN AND PELVIS WITHOUT AND WITH CONTRAST  TECHNIQUE: Multidetector CT imaging of the abdomen and pelvis was performed without contrast material in one or both body regions, followed by contrast material(s) and further sections in one or both body regions.  CONTRAST: 125 cc Isovue 300.  COMPARISON: CT UROGRAM/STONE PROTOCOL W/O dated 04/03/2007  FINDINGS: Lung bases show no acute findings. Heart size within normal limits. No pericardial or pleural effusion.  Liver, gallbladder and adrenal glands are unremarkable. Mild right hydronephrosis secondary to a 5 mm right ureteral pelvic junction stone. Stones are seen in the left kidney. Low-attenuation lesions measure up to 9 mm in the right kidney and are most likely cysts. Spleen, pancreas, stomach and bowel are unremarkable. No pathologically enlarged lymph nodes. No free fluid. Hysterectomy. Ovaries are visualized. Tiny left inguinal hernia contains fat. Laxity or herniation of the ventral abdominal wall, at the level of the umbilicus, containing fat. No worrisome lytic or sclerotic lesions. Minimal grade 1 anterolisthesis of L4 on L5.  IMPRESSION: 1. Mild right hydronephrosis secondary to a 5 mm right ureteral pelvic junction stone. 2. Left renal stones.   Electronically Signed  By: Lorin Picket M.D.  On: 02/26/2014 10:25  BUN & CREATININE [id number] 04:05PM Kathie Rhodes SPECIMEN TYPE: BLOOD  Test Name Result Flag Reference CREATININE 0.98  mg/dL  0.50-1.40   Procedure  Procedure: Cystoscopy  Chaperone Present: .  Indication: Hematuria.  Informed Consent: Risks, benefits, and potential adverse events were discussed and informed consent was obtained from the patient.  Prep: The patient was prepped with hibiclens.  Procedure Note:  Urethral meatus:. No abnormalities.  Anterior urethra: No abnormalities.  Bladder: Visulization was clear. The ureteral orifices were in the normal anatomic position bilaterally and had clear efflux of urine. A systematic survey of the bladder demonstrated no bladder tumors or stones. The mucosa was smooth without abnormalities. The patient tolerated the procedure well.  Complications: None.    Assessment  I went over the results of her evaluation which has revealed normal renal function with a creatinine of 0.98. Her CT scan has revealed left renal calculi as well as a stone in the left renal pelvis near the UPJ and a proximal ureteral stone on the right hand side. The stones have Hounsfield units of ~600. Neither stone is causing obstruction. She is not having any pain in the flank region.  Cystoscopically I found no abnormality in the bladder. It appears her gross dysuria is secondary to her left renal pelvic stone.    We discussed the management of urinary stones. These options include observation, ureteroscopy, shockwave lithotripsy, and PCNL. We discussed which options are relevant to these particular stones. We discussed the natural history of stones as well as the complications of untreated stones and the impact on quality of life without treatment as well as with each of the above listed treatments. We also discussed the efficacy of each treatment in its ability to clear the stone burden. With any of these management options I discussed the signs  and symptoms of infection and the need for emergent treatment should these be experienced. For each option we discussed the ability of each procedure  to clear the patient of their stone burden.    For observation I described the risks which include but are not limited to silent renal damage, life-threatening infection, need for emergent surgery, failure to pass stone, and pain.    For ureteroscopy I described the risks which include heart attack, stroke, pulmonary embolus, death, bleeding, infection, damage to contiguous structures, positioning injury, ureteral stricture, ureteral avulsion, ureteral injury, need for ureteral stent, inability to perform ureteroscopy, need for an interval procedure, inability to clear stone burden, stent discomfort and pain.    For shockwave lithotripsy I described the risks which include arrhythmia, kidney contusion, kidney hemorrhage, need for transfusion, long-term risk of diabetes or hypertension, back discomfort, flank ecchymosis, flank abrasion, inability to break up stone, inability to pass stone fragments, Steinstrasse, infection associated with obstructing stones, need for different surgical procedure, need for repeat shockwave lithotripsy, and death.    I think she would be best served with a lithotripsy of her left renal stone first and I discussed that with her today. I will have her stop her aspirin. Once that stone is treated if she does not pass a right ureteral stone that will need to be treated with either lithotripsy or ureteroscopy.   Plan  1. She is going to stop her 81 mg aspirin.  2. She is scheduled for lithotripsy of her left renal pelvic calculus.

## 2014-03-10 NOTE — Discharge Instructions (Signed)
Lithotripsy, Care After °Refer to this sheet in the next few weeks. These instructions provide you with information on caring for yourself after your procedure. Your health care provider may also give you more specific instructions. Your treatment has been planned according to current medical practices, but problems sometimes occur. Call your health care provider if you have any problems or questions after your procedure. °WHAT TO EXPECT AFTER THE PROCEDURE  °· Your urine may have a red tinge for a few days after treatment. Blood loss is usually minimal. °· You may have soreness in the back or flank area. This usually goes away after a few days. The procedure can cause blotches or bruises on the back where the pressure wave enters the skin. These marks usually cause only minimal discomfort and should disappear in a short time. °· Stone fragments should begin to pass within 24 hours of treatment. However, a delayed passage is not unusual. °· You may have pain, discomfort, and feel sick to your stomach (nauseated) when the crushed fragments of stone are passed down the tube from the kidney to the bladder. Stone fragments can pass soon after the procedure and may last for up to 4 8 weeks. °· A small number of patients may have severe pain when stone fragments are not able to pass, which leads to an obstruction. °· If your stone is greater than 1 inch (2.5 cm) in diameter or if you have multiple stones that have a combined diameter greater than 1 inch (2.5 cm), you may require more than one treatment. °· If you had a stent placed prior to your procedure, you may experience some discomfort, especially during urination. You may experience the pain or discomfort in your flank or back, or you may experience a sharp pain or discomfort at the base of your penis or in your lower abdomen. The discomfort usually lasts only a few minutes after urinating. °HOME CARE INSTRUCTIONS  °· Rest at home until you feel your energy  improving. °· Only take over-the-counter or prescription medicines for pain, discomfort, or fever as directed by your health care provider. Depending on the type of lithotripsy, you may need to take antibiotics and anti-inflammatory medicines for a few days. °· Drink enough water and fluids to keep your urine clear or pale yellow. This helps "flush" your kidneys. It helps pass any remaining pieces of stone and prevents stones from coming back. °· Most people can resume daily activities within 1 2 days after standard lithotripsy. It can take longer to recover from laser and percutaneous lithotripsy. °· If the stones are in your urinary system, you may be asked to strain your urine at home to look for stones. Any stones that are found can be sent to a medical lab for examination. °· Visit your health care provider for a follow-up appointment in a few weeks. Your doctor may remove your stent if you have one. Your health care provider will also check to see whether stone particles still remain. °SEEK MEDICAL CARE IF:  °· Your pain is not relieved by medicine. °· You have a lasting nauseous feeling. °· You feel there is too much blood in the urine. °· You develop persistent problems with frequent or painful urination that does not at least partially improve after 2 days following the procedure. °· You have a congested cough. °· You feel lightheaded. °· You develop a rash or any other signs that might suggest an allergic problem. °· You develop any reaction or side   effects to your medicine(s). °SEEK IMMEDIATE MEDICAL CARE IF:  °· You experience severe back or flank pain or both. °· You see nothing but blood when you urinate. °· You cannot pass any urine at all. °· You have a fever or shaking chills. °· You develop shortness of breath, difficulty breathing, or chest pain. °· You develop vomiting that will not stop after 6 8 hours. °· You have a fainting episode. °Document Released: 12/11/2007 Document Revised: 09/11/2013  Document Reviewed: 06/06/2013 °[employer]® Patient Information ©2014 [employer]. ° °

## 2014-03-10 NOTE — Op Note (Signed)
See [facility] OP note scanned into chart. 

## 2014-08-12 ENCOUNTER — Other Ambulatory Visit: Payer: Self-pay | Admitting: Obstetrics & Gynecology

## 2014-08-13 LAB — CYTOLOGY - PAP

## 2014-11-04 ENCOUNTER — Other Ambulatory Visit: Payer: Self-pay | Admitting: Dermatology

## 2017-02-23 ENCOUNTER — Encounter (HOSPITAL_COMMUNITY): Payer: Self-pay

## 2017-02-23 ENCOUNTER — Inpatient Hospital Stay (HOSPITAL_COMMUNITY)
Admission: EM | Admit: 2017-02-23 | Discharge: 2017-02-25 | DRG: 247 | Disposition: A | Discharge status: Home / Self Care | Payer: BLUE CROSS/BLUE SHIELD | Attending: Cardiovascular Disease | Admitting: Cardiovascular Disease

## 2017-02-23 ENCOUNTER — Emergency Department (HOSPITAL_COMMUNITY): Payer: BLUE CROSS/BLUE SHIELD

## 2017-02-23 DIAGNOSIS — Z791 Long term (current) use of non-steroidal anti-inflammatories (NSAID): Secondary | ICD-10-CM

## 2017-02-23 DIAGNOSIS — I219 Acute myocardial infarction, unspecified: Secondary | ICD-10-CM

## 2017-02-23 DIAGNOSIS — E782 Mixed hyperlipidemia: Secondary | ICD-10-CM | POA: Diagnosis present

## 2017-02-23 DIAGNOSIS — I214 Non-ST elevation (NSTEMI) myocardial infarction: Secondary | ICD-10-CM | POA: Diagnosis present

## 2017-02-23 DIAGNOSIS — M1990 Unspecified osteoarthritis, unspecified site: Secondary | ICD-10-CM | POA: Diagnosis present

## 2017-02-23 DIAGNOSIS — Z79899 Other long term (current) drug therapy: Secondary | ICD-10-CM | POA: Diagnosis not present

## 2017-02-23 DIAGNOSIS — Z79890 Hormone replacement therapy (postmenopausal): Secondary | ICD-10-CM

## 2017-02-23 DIAGNOSIS — I10 Essential (primary) hypertension: Secondary | ICD-10-CM | POA: Diagnosis present

## 2017-02-23 DIAGNOSIS — Z7982 Long term (current) use of aspirin: Secondary | ICD-10-CM

## 2017-02-23 DIAGNOSIS — I2510 Atherosclerotic heart disease of native coronary artery without angina pectoris: Secondary | ICD-10-CM | POA: Diagnosis present

## 2017-02-23 DIAGNOSIS — Z955 Presence of coronary angioplasty implant and graft: Secondary | ICD-10-CM

## 2017-02-23 DIAGNOSIS — R079 Chest pain, unspecified: Secondary | ICD-10-CM | POA: Diagnosis present

## 2017-02-23 DIAGNOSIS — Z8249 Family history of ischemic heart disease and other diseases of the circulatory system: Secondary | ICD-10-CM | POA: Diagnosis not present

## 2017-02-23 DIAGNOSIS — R319 Hematuria, unspecified: Secondary | ICD-10-CM

## 2017-02-23 HISTORY — DX: Headache, unspecified: R51.9

## 2017-02-23 HISTORY — DX: Personal history of urinary calculi: Z87.442

## 2017-02-23 HISTORY — DX: Headache: R51

## 2017-02-23 HISTORY — DX: Low back pain: M54.5

## 2017-02-23 HISTORY — DX: Unspecified osteoarthritis, unspecified site: M19.90

## 2017-02-23 HISTORY — DX: Other chronic pain: G89.29

## 2017-02-23 HISTORY — DX: Acute myocardial infarction, unspecified: I21.9

## 2017-02-23 HISTORY — DX: Atherosclerotic heart disease of native coronary artery without angina pectoris: I25.10

## 2017-02-23 HISTORY — DX: Basal cell carcinoma of skin of nose: C44.311

## 2017-02-23 HISTORY — DX: Low back pain, unspecified: M54.50

## 2017-02-23 LAB — URINALYSIS, ROUTINE W REFLEX MICROSCOPIC
BILIRUBIN URINE: NEGATIVE
Glucose, UA: NEGATIVE mg/dL
Ketones, ur: NEGATIVE mg/dL
LEUKOCYTES UA: NEGATIVE
Nitrite: NEGATIVE
PH: 6 (ref 5.0–8.0)
Protein, ur: 30 mg/dL — AB
Specific Gravity, Urine: 1.018 (ref 1.005–1.030)

## 2017-02-23 LAB — CBC
HCT: 41 % (ref 36.0–46.0)
Hemoglobin: 13 g/dL (ref 12.0–15.0)
MCH: 29.5 pg (ref 26.0–34.0)
MCHC: 31.7 g/dL (ref 30.0–36.0)
MCV: 93.2 fL (ref 78.0–100.0)
PLATELETS: 224 10*3/uL (ref 150–400)
RBC: 4.4 MIL/uL (ref 3.87–5.11)
RDW: 13.5 % (ref 11.5–15.5)
WBC: 5.1 10*3/uL (ref 4.0–10.5)

## 2017-02-23 LAB — COMPREHENSIVE METABOLIC PANEL
ALT: 28 U/L (ref 14–54)
ANION GAP: 10 (ref 5–15)
AST: 38 U/L (ref 15–41)
Albumin: 3.7 g/dL (ref 3.5–5.0)
Alkaline Phosphatase: 44 U/L (ref 38–126)
BUN: 18 mg/dL (ref 6–20)
CALCIUM: 9.3 mg/dL (ref 8.9–10.3)
CO2: 26 mmol/L (ref 22–32)
CREATININE: 1.04 mg/dL — AB (ref 0.44–1.00)
Chloride: 104 mmol/L (ref 101–111)
GFR calc Af Amer: >60 mL/min (ref >60)
GFR calc non Af Amer: 56 mL/min — ABNORMAL LOW (ref >60)
Glucose, Bld: 101 mg/dL — ABNORMAL HIGH (ref 65–99)
Potassium: 3.4 mmol/L — ABNORMAL LOW (ref 3.5–5.1)
Sodium: 140 mmol/L (ref 135–145)
TOTAL PROTEIN: 6.4 g/dL — AB (ref 6.5–8.1)
Total Bilirubin: 0.6 mg/dL (ref 0.3–1.2)

## 2017-02-23 LAB — TROPONIN I
TROPONIN I: 1.01 ng/mL — AB (ref <0.03)
Troponin I: 0.14 ng/mL (ref <0.03)

## 2017-02-23 LAB — PROTIME-INR
INR: 1.05
Prothrombin Time: 13.7 seconds (ref 11.4–15.2)

## 2017-02-23 MED ORDER — MORPHINE SULFATE (PF) 4 MG/ML IV SOLN
4.0000 mg | Freq: Once | INTRAVENOUS | Status: AC
  Administered 2017-02-23: 4 mg via INTRAVENOUS
  Filled 2017-02-23: qty 1

## 2017-02-23 MED ORDER — POTASSIUM CHLORIDE CRYS ER 20 MEQ PO TBCR
40.0000 meq | EXTENDED_RELEASE_TABLET | Freq: Once | ORAL | Status: AC
  Administered 2017-02-23: 40 meq via ORAL
  Filled 2017-02-23: qty 2

## 2017-02-23 MED ORDER — PANTOPRAZOLE SODIUM 40 MG PO TBEC
40.0000 mg | DELAYED_RELEASE_TABLET | Freq: Every day | ORAL | Status: DC
  Administered 2017-02-25: 10:00:00 40 mg via ORAL
  Filled 2017-02-23: qty 1

## 2017-02-23 MED ORDER — FENOFIBRATE 160 MG PO TABS
160.0000 mg | ORAL_TABLET | Freq: Every day | ORAL | Status: DC
  Administered 2017-02-25: 10:00:00 160 mg via ORAL
  Filled 2017-02-23: qty 1

## 2017-02-23 MED ORDER — NITROGLYCERIN 2 % TD OINT
1.0000 [in_us] | TOPICAL_OINTMENT | Freq: Once | TRANSDERMAL | Status: AC
  Administered 2017-02-23: 1 [in_us] via TOPICAL
  Filled 2017-02-23: qty 1

## 2017-02-23 MED ORDER — SODIUM CHLORIDE 0.9% FLUSH: 3.0000 mL | INTRAVENOUS | Status: DC | PRN

## 2017-02-23 MED ORDER — ACETAMINOPHEN 325 MG PO TABS: 650.0000 mg | ORAL_TABLET | ORAL | Status: DC | PRN

## 2017-02-23 MED ORDER — SODIUM CHLORIDE 0.9 % WEIGHT BASED INFUSION
3.0000 mL/kg/h | INTRAVENOUS | Status: DC
Start: 2017-02-24 — End: 2017-02-24

## 2017-02-23 MED ORDER — SODIUM CHLORIDE 0.9% FLUSH: 3.0000 mL | Freq: Two times a day (BID) | INTRAVENOUS | Status: DC

## 2017-02-23 MED ORDER — SODIUM CHLORIDE 0.9 % WEIGHT BASED INFUSION: 1.0000 mL/kg/h | INTRAVENOUS | Status: DC

## 2017-02-23 MED ORDER — DULOXETINE HCL 30 MG PO CPEP
30.0000 mg | ORAL_CAPSULE | Freq: Every day | ORAL | Status: DC
  Administered 2017-02-25: 10:00:00 30 mg via ORAL
  Filled 2017-02-23: qty 1

## 2017-02-23 MED ORDER — NITROGLYCERIN 0.4 MG SL SUBL: 0.4000 mg | SUBLINGUAL_TABLET | SUBLINGUAL | Status: DC | PRN

## 2017-02-23 MED ORDER — ONDANSETRON HCL 4 MG/2ML IJ SOLN: 4.0000 mg | Freq: Four times a day (QID) | INTRAMUSCULAR | Status: DC | PRN

## 2017-02-23 MED ORDER — ASPIRIN 81 MG PO CHEW
81.0000 mg | CHEWABLE_TABLET | ORAL | Status: AC
  Administered 2017-02-24: 81 mg via ORAL
  Filled 2017-02-23: qty 1

## 2017-02-23 MED ORDER — HEPARIN (PORCINE) IN NACL 100-0.45 UNIT/ML-% IJ SOLN
1100.0000 [IU]/h | INTRAMUSCULAR | Status: DC
  Administered 2017-02-23: 900 [IU]/h via INTRAVENOUS
  Filled 2017-02-23: qty 250

## 2017-02-23 MED ORDER — ROSUVASTATIN CALCIUM 20 MG PO TABS
20.0000 mg | ORAL_TABLET | Freq: Every morning | ORAL | Status: DC
  Administered 2017-02-25: 10:00:00 20 mg via ORAL
  Filled 2017-02-23: qty 1

## 2017-02-23 MED ORDER — CARVEDILOL 3.125 MG PO TABS
3.1250 mg | ORAL_TABLET | Freq: Two times a day (BID) | ORAL | Status: DC
  Administered 2017-02-24 – 2017-02-25 (×2): 3.125 mg via ORAL
  Filled 2017-02-23 (×2): qty 1

## 2017-02-23 MED ORDER — SODIUM CHLORIDE 0.9 % IV SOLN: 250.0000 mL | INTRAVENOUS | Status: DC | PRN

## 2017-02-23 MED ORDER — HEPARIN BOLUS VIA INFUSION
4000.0000 [IU] | Freq: Once | INTRAVENOUS | Status: AC
  Administered 2017-02-23: 4000 [IU] via INTRAVENOUS
  Filled 2017-02-23: qty 4000

## 2017-02-23 MED ORDER — ASPIRIN EC 81 MG PO TBEC
81.0000 mg | DELAYED_RELEASE_TABLET | Freq: Every day | ORAL | Status: DC
  Administered 2017-02-25: 10:00:00 81 mg via ORAL
  Filled 2017-02-23: qty 1

## 2017-02-23 NOTE — Progress Notes (Signed)
Chief Complaint:  Unstable angina  HPI:  This is a 66 y.o. female with a past medical history significant for hypertension and hypercholesterolemia and a strong family history of premature coronary artery disease and death from cardiac illness presenting with roughly 48 hours of stuttering chest pain. The symptoms have occurred off and on both at rest and with activity, but are clearly worsened by even light physical activity. She has pressure that radiates across the entire anterior chest. Nitroglycerin provided prompt relief. At this point she only has barely noticeable chest heaviness 1/10.   There was accompanying dyspnea that was clearly worse with physical activity and worse when the chest tightness was more intense. She denies palpitations, dizziness, syncope, focal neurological events, leg pain or tenderness or swelling, cough or hemoptysis, pleurisy, headache, new focal neurological complaints.  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 The symptoms have never occurred in the past.  Her past medical history significant for kidney stones that have required lithotripsy. She has undergone several minor surgical procedures as well as a total abdominal hysterectomy. She takes estrogen supplements. She also takes daily nonsteroidal anti-inflammatory drugs for a variety of arthritis type aches and pains.   She already takes a statin and daily aspirin and also takes fenofibrate.  PMHx:  Past Medical History:  Diagnosis Date  . Arthritis   . High cholesterol   . Hypertension   . Kidney stones     Past Surgical History:  Procedure Laterality Date  . ABDOMINAL HYSTERECTOMY    . arthritic cyst removed Left 2002   index finger  . CARPAL TUNNEL RELEASE  1996  . CYSTOSCOPY Left 04/2006   kidney stone on the left obtained  . inflammed milk duct Right 1991   removal    FAMHx:  Her father had sudden death related to coronary disease in his late  84s.  SOCHx:   reports that she has never smoked. She has never used smokeless tobacco. She reports that she does not drink alcohol. Her drug history is not on file.  ALLERGIES:  Allergies  Allergen Reactions  . Altace [Ramipril] Shortness Of Breath and Nausea Only    Chest pains, indigestion  . Benicar [Olmesartan] Shortness Of Breath and Nausea Only    Chest pains, indigestion  . Sulfa Antibiotics Rash    ROS: Pertinent items noted in HPI and remainder of comprehensive ROS otherwise negative.  HOME MEDS: No current facility-administered medications on file prior to encounter.    Current Outpatient Prescriptions on File Prior to Encounter  Medication Sig Dispense Refill  . aspirin 81 MG tablet Take 81 mg by mouth daily.    Marland Kitchen azelastine (ASTELIN) 137 MCG/SPRAY nasal spray Place 1 spray into the nose daily. Use in each nostril as directed     . DULoxetine (CYMBALTA) 30 MG capsule Take 30 mg by mouth daily.     Marland Kitchen estradiol (ESTRACE) 2 MG tablet Take 2 mg by mouth every morning.     . fenofibrate 160 MG tablet Take 160 mg by mouth daily.    . fish oil-omega-3 fatty acids 1000 MG capsule Take 1 g by mouth daily.     . meloxicam (MOBIC) 15 MG tablet Take 15 mg by mouth daily.    . Multiple Vitamin (MULTIVITAMIN WITH MINERALS) TABS tablet Take 1 tablet by mouth daily.    Marland Kitchen omeprazole (PRILOSEC) 20 MG capsule Take 20 mg by mouth daily.    Marland Kitchen  rosuvastatin (CRESTOR) 20 MG tablet Take 20 mg by mouth every morning.     . traMADol (ULTRAM) 50 MG tablet Take 50 mg by mouth every 12 (twelve) hours as needed for moderate pain.       LABS/IMAGING: Results for orders placed or performed during the hospital encounter of 02/23/17 (from the past 48 hour(s))  Urinalysis, Routine w reflex microscopic     Status: Abnormal   Collection Time: 02/23/17  5:40 PM  Result Value Ref Range   Color, Urine YELLOW YELLOW   APPearance HAZY (A) CLEAR   Specific Gravity, Urine 1.018 1.005 - 1.030   pH 6.0 5.0  - 8.0   Glucose, UA NEGATIVE NEGATIVE mg/dL   Hgb urine dipstick MODERATE (A) NEGATIVE   Bilirubin Urine NEGATIVE NEGATIVE   Ketones, ur NEGATIVE NEGATIVE mg/dL   Protein, ur 30 (A) NEGATIVE mg/dL   Nitrite NEGATIVE NEGATIVE   Leukocytes, UA NEGATIVE NEGATIVE   RBC / HPF TOO NUMEROUS TO COUNT 0 - 5 RBC/hpf   WBC, UA 0-5 0 - 5 WBC/hpf   Bacteria, UA RARE (A) NONE SEEN   Squamous Epithelial / LPF 0-5 (A) NONE SEEN   Mucous PRESENT   Comprehensive metabolic panel     Status: Abnormal   Collection Time: 02/23/17  6:00 PM  Result Value Ref Range   Sodium 140 135 - 145 mmol/L   Potassium 3.4 (L) 3.5 - 5.1 mmol/L   Chloride 104 101 - 111 mmol/L   CO2 26 22 - 32 mmol/L   Glucose, Bld 101 (H) 65 - 99 mg/dL   BUN 18 6 - 20 mg/dL   Creatinine, Ser 2.25 (H) 0.44 - 1.00 mg/dL   Calcium 9.3 8.9 - 33.4 mg/dL   Total Protein 6.4 (L) 6.5 - 8.1 g/dL   Albumin 3.7 3.5 - 5.0 g/dL   AST 38 15 - 41 U/L   ALT 28 14 - 54 U/L   Alkaline Phosphatase 44 38 - 126 U/L   Total Bilirubin 0.6 0.3 - 1.2 mg/dL   GFR calc non Af Amer 56 (L) >60 mL/min   GFR calc Af Amer >60 >60 mL/min    Comment: (NOTE) The eGFR has been calculated using the CKD EPI equation. This calculation has not been validated in all clinical situations. eGFR's persistently <60 mL/min signify possible Chronic Kidney Disease.    Anion gap 10 5 - 15  CBC     Status: None   Collection Time: 02/23/17  6:00 PM  Result Value Ref Range   WBC 5.1 4.0 - 10.5 K/uL   RBC 4.40 3.87 - 5.11 MIL/uL   Hemoglobin 13.0 12.0 - 15.0 g/dL   HCT 24.0 82.0 - 10.0 %   MCV 93.2 78.0 - 100.0 fL   MCH 29.5 26.0 - 34.0 pg   MCHC 31.7 30.0 - 36.0 g/dL   RDW 46.2 98.1 - 29.8 %   Platelets 224 150 - 400 K/uL  Troponin I  (0, 3, 6)     Status: Abnormal   Collection Time: 02/23/17  6:00 PM  Result Value Ref Range   Troponin I 0.14 (HH) <0.03 ng/mL    Comment: CRITICAL RESULT CALLED TO, READ BACK BY AND VERIFIED WITHEarmon Phoenix 1930 02/23/2017 WBOND     Dg Chest 2 View  Result Date: 02/23/2017 CLINICAL DATA:  Chest pain intermittently beginning yesterday. EXAM: CHEST  2 VIEW COMPARISON:  02/08/2013 FINDINGS: Lungs are adequately inflated without focal airspace consolidation or effusion. Cardiomediastinal silhouette  is within normal. There are mild degenerate changes of the spine. IMPRESSION: No active cardiopulmonary disease. Electronically Signed   By: Marin Olp M.D.   On: 02/23/2017 18:33    ECG Normal sinus rhythm. The tracing is of very poor technical quality, nevertheless there is evidence of ST segment depression and T-wave inversion in leads V3-V6 as well as the inferior leads.  VITALS: Blood pressure 125/79, pulse 65, temperature 97.9 F (36.6 C), temperature source Oral, resp. rate 13, height '5\' 4"'$  (1.626 m), weight 86.2 kg (190 lb), SpO2 100 %.  EXAM:  General: Alert, oriented x3, no distress Head: no evidence of trauma, PERRL, EOMI, no exophtalmos or lid lag, no myxedema, no xanthelasma; normal ears, nose and oropharynx Neck: Normal jugular venous pulsations and no hepatojugular reflux; brisk carotid pulses without delay and no carotid bruits Chest: clear to auscultation, no signs of consolidation by percussion or palpation, normal fremitus, symmetrical and full respiratory excursions Cardiovascular: normal position and quality of the apical impulse, regular rhythm, normal first heart sound and normal second heart sounds, no rubs or gallops, no murmur Abdomen: no tenderness or distention, no masses by palpation, no abnormal pulsatility or arterial bruits, normal bowel sounds, no hepatosplenomegaly Extremities: no clubbing, cyanosis or edema; 2+ radial, ulnar and brachial pulses bilaterally; 2+ right femoral, posterior tibial and dorsalis pedis pulses; 2+ left femoral, posterior tibial and dorsalis pedis pulses; no subclavian or femoral bruits Neurological: grossly nonfocal   IMPRESSION: Unstable angina/small non-ST  segment elevation myocardial infarction in a relatively young, but postmenopausal woman with hypertension and hyperlipidemia. High-risk features are present with ST segment depression and mildly abnormal cardiac enzymes.  She appears to have severe mixed hyperlipidemia, but a lipid profile is currently not available for review. Plan to continue highly active statin. She will probably have to discontinue her NSAIDs and preferably will wean off all estrogen supplements.  PLAN: Plan coronary angiography in a.m., emergency after and revascularization if symptoms worsen overnight and frank ST segment elevation develops. This procedure has been fully reviewed with the patient and written informed consent has been obtained. Start beta blockers. Hold ARB and hydrochlorothiazide. Replete potassium. Recheck enzymes every 6 hours. Repeat an ECG especially since the initial tracing is of poor technical quality. Echocardiogram tomorrow. Review lipid profile. Target LDL-C<70.  Mrs. Cressy daughter-in-law Alyse Low works on [place]. in [facility], [facility], [facility].  Sanda Klein, MD, [facility] [phone] office [phone] pager  02/23/2017, 8:07 PM

## 2017-02-23 NOTE — ED Triage Notes (Signed)
Pt presents to the ed from home with ems for chest pain that started yesterday on and off and has been consistent for the last hour, she describes it as a pressure that goes across her chest. Denies any other symptoms with it.  She received 324 of aspirin and 2 nitro en route with ems with some relief

## 2017-02-23 NOTE — ED Provider Notes (Signed)
[facility] DEPT Provider Note   CSN: [id number] Arrival date & time: 02/23/17  1738     History   Chief Complaint Chief Complaint  Patient presents with  . Chest Pain    HPI [name] [name] is a 66 y.o. female.  Pt presents to the ED today with CP.  She said that it started yesterday and has been on and off.  For the past hour, it has been constant.  The pt called EMS who gave him ASA and nitro.  2 nitro brought her pain down to a 1.  No prior cardiac hx, but multiple risk factors.  She has never had a stress test.      Past Medical History:  Diagnosis Date  . Arthritis   . High cholesterol   . Hypertension   . Kidney stones     There are no active problems to display for this patient.   Past Surgical History:  Procedure Laterality Date  . ABDOMINAL HYSTERECTOMY    . arthritic cyst removed Left 2002   index finger  . CARPAL TUNNEL RELEASE  1996  . CYSTOSCOPY Left 04/2006   kidney stone on the left obtained  . inflammed milk duct Right 1991   removal    OB History    No data available       Home Medications    Prior to Admission medications   Medication Sig Start Date End Date Taking? Authorizing Provider  aspirin 81 MG tablet Take 81 mg by mouth daily.   Yes Historical Provider, MD  azelastine (ASTELIN) 137 MCG/SPRAY nasal spray Place 1 spray into the nose daily. Use in each nostril as directed    Yes Historical Provider, MD  DULoxetine (CYMBALTA) 30 MG capsule Take 30 mg by mouth daily.    Yes Historical Provider, MD  estradiol (ESTRACE) 2 MG tablet Take 2 mg by mouth every morning.    Yes Historical Provider, MD  fenofibrate 160 MG tablet Take 160 mg by mouth daily.   Yes Historical Provider, MD  fish oil-omega-3 fatty acids 1000 MG capsule Take 1 g by mouth daily.    Yes Historical Provider, MD  Glucosamine-Chondroit-Vit C-Mn (GLUCOSAMINE 1500 COMPLEX) CAPS Take 1 capsule by mouth daily.   Yes Historical Provider, MD    irbesartan-hydrochlorothiazide (AVALIDE) 300-12.5 MG tablet Take 1 tablet by mouth daily.   Yes Historical Provider, MD  levocetirizine (XYZAL) 5 MG tablet Take 5 mg by mouth every evening.   Yes Historical Provider, MD  meloxicam (MOBIC) 15 MG tablet Take 15 mg by mouth daily.   Yes Historical Provider, MD  Multiple Vitamin (MULTIVITAMIN WITH MINERALS) TABS tablet Take 1 tablet by mouth daily.   Yes Historical Provider, MD  omeprazole (PRILOSEC) 20 MG capsule Take 20 mg by mouth daily.   Yes Historical Provider, MD  rosuvastatin (CRESTOR) 20 MG tablet Take 20 mg by mouth every morning.    Yes Historical Provider, MD  traMADol (ULTRAM) 50 MG tablet Take 50 mg by mouth every 12 (twelve) hours as needed for moderate pain.    Yes Historical Provider, MD    Family History No family history on file.  Social History Social History  Substance Use Topics  . Smoking status: Never Smoker  . Smokeless tobacco: Never Used  . Alcohol use No     Allergies   Altace [ramipril]; Benicar [olmesartan]; and Sulfa antibiotics   Review of Systems Review of Systems  Cardiovascular: Positive for chest pain.  All other systems  reviewed and are negative.    Physical Exam Updated Vital Signs BP 125/79   Pulse 65   Temp 97.9 F (36.6 C) (Oral)   Resp 13   Ht 5\' 4"  (1.626 m)   Wt 190 lb (86.2 kg)   SpO2 100%   BMI 32.61 kg/m   Physical Exam  Constitutional: She is oriented to person, place, and time. She appears well-developed and well-nourished.  HENT:  Head: Normocephalic and atraumatic.  Right Ear: External ear normal.  Left Ear: External ear normal.  Nose: Nose normal.  Mouth/Throat: Oropharynx is clear and moist.  Eyes: Conjunctivae and EOM are normal. Pupils are equal, round, and reactive to light.  Neck: Normal range of motion. Neck supple.  Cardiovascular: Normal rate, regular rhythm, normal heart sounds and intact distal pulses.   Pulmonary/Chest: Effort normal and breath sounds  normal.  Abdominal: Soft. Bowel sounds are normal.  Musculoskeletal: Normal range of motion.  Neurological: She is alert and oriented to person, place, and time.  Skin: Skin is warm and dry. Capillary refill takes less than 2 seconds.  Psychiatric: She has a normal mood and affect. Her behavior is normal. Judgment and thought content normal.  Nursing note and vitals reviewed.    ED Treatments / Results  Labs (all labs ordered are listed, but only abnormal results are displayed) Labs Reviewed  COMPREHENSIVE METABOLIC PANEL - Abnormal; Notable for the following:       Result Value   Potassium 3.4 (*)    Glucose, Bld 101 (*)    Creatinine, Ser 1.04 (*)    Total Protein 6.4 (*)    GFR calc non Af Amer 56 (*)    All other components within normal limits  TROPONIN I - Abnormal; Notable for the following:    Troponin I 0.14 (*)    All other components within normal limits  URINALYSIS, ROUTINE W REFLEX MICROSCOPIC - Abnormal; Notable for the following:    APPearance HAZY (*)    Hgb urine dipstick MODERATE (*)    Protein, ur 30 (*)    Bacteria, UA RARE (*)    Squamous Epithelial / LPF 0-5 (*)    All other components within normal limits  CBC  TROPONIN I  TROPONIN I    EKG  EKG Interpretation  Date/Time:  Thursday February 23 2017 17:40:02 EDT Ventricular Rate:  66 PR Interval:    QRS Duration: 96 QT Interval:  431 QTC Calculation: 449 R Axis:   -38 Text Interpretation:  Junctional rhythm Left axis deviation Probable anterior infarct, age indeterminate Abnormal T, consider ischemia, inferior leads No old tracing to compare Confirmed by [facility] MD, Kadden Osterhout [phone]) on 02/23/2017 5:48:57 PM       Radiology Dg Chest 2 View  Result Date: 02/23/2017 CLINICAL DATA:  Chest pain intermittently beginning yesterday. EXAM: CHEST  2 VIEW COMPARISON:  02/08/2013 FINDINGS: Lungs are adequately inflated without focal airspace consolidation or effusion. Cardiomediastinal silhouette is within  normal. There are mild degenerate changes of the spine. IMPRESSION: No active cardiopulmonary disease. Electronically Signed   By: Marin Olp M.D.   On: 02/23/2017 18:33    Procedures Procedures (including critical care time)  Medications Ordered in ED Medications  nitroGLYCERIN (NITROGLYN) 2 % ointment 1 inch (1 inch Topical Given 02/23/17 1832)     Initial Impression / Assessment and Plan / ED Course  I have reviewed the triage vital signs and the nursing notes.  Pertinent labs & imaging results that were available during  my care of the patient were reviewed by me and considered in my medical decision making (see chart for details).    Pt d/w Dr. Sallyanne Kuster who will admit.  He plans for a cath in the morning.    Final Clinical Impressions(s) / ED Diagnoses   Final diagnoses:  NSTEMI (non-ST elevated myocardial infarction) ([place])  Hematuria, unspecified type    New Prescriptions New Prescriptions   No medications on file     Isla Pence, MD 02/23/17 2032

## 2017-02-23 NOTE — Progress Notes (Signed)
ANTICOAGULATION CONSULT NOTE - Initial Consult  Pharmacy Consult for Heparin  Indication: chest pain/ACS  Allergies  Allergen Reactions  . Altace [Ramipril] Shortness Of Breath and Nausea Only    Chest pains, indigestion  . Benicar [Olmesartan] Shortness Of Breath and Nausea Only    Chest pains, indigestion  . Sulfa Antibiotics Rash    Patient Measurements: Height: 5\' 4"  (162.6 cm) Weight: 190 lb (86.2 kg) IBW/kg (Calculated) : 54.7 Heparin Dosing Weight: 74 kg  Vital Signs: Temp: 97.9 F (36.6 C) (03/22 1741) Temp Source: Oral (03/22 1741) BP: 125/79 (03/22 1845) Pulse Rate: 65 (03/22 1845)  Labs:  Recent Labs  02/23/17 1800  HGB 13.0  HCT 41.0  PLT 224  CREATININE 1.04*  TROPONINI 0.14*    Estimated Creatinine Clearance: 58.1 mL/min (A) (by C-G formula based on SCr of 1.04 mg/dL (H)).   Medical History: Past Medical History:  Diagnosis Date  . Arthritis   . High cholesterol   . Hypertension   . Kidney stones     Medications:   (Not in a hospital admission) Scheduled:  Infusions:  PRN:  Anti-infectives    None      Assessment: 66 year old female admitted 3/22 with unstable angina/NSTEMI. No anticoagulation PTA, initiating heparin inpatient.   Baseline CBC is within normal limits and no notes of bleeding.   Goal of Therapy:  Heparin level 0.3-0.7 units/ml Monitor platelets by anticoagulation protocol: Yes   Plan:  Give 4000 units bolus x 1 Start heparin infusion at 900 units/hr units/hr Check anti-Xa level in 6 hours and daily while on heparin Continue to monitor H&H and platelets  Coronary angiography in AM per cards note  Demetrius Charity, PharmD Acute Care Pharmacy Resident  Pager: [phone] 02/23/2017

## 2017-02-24 ENCOUNTER — Encounter (HOSPITAL_COMMUNITY)
Admission: EM | Disposition: A | Discharge status: Home / Self Care | Payer: Self-pay | Source: Home / Self Care | Attending: Cardiovascular Disease

## 2017-02-24 ENCOUNTER — Encounter (HOSPITAL_COMMUNITY): Payer: Self-pay | Admitting: General Practice

## 2017-02-24 ENCOUNTER — Inpatient Hospital Stay (HOSPITAL_COMMUNITY): Payer: BLUE CROSS/BLUE SHIELD

## 2017-02-24 DIAGNOSIS — I2510 Atherosclerotic heart disease of native coronary artery without angina pectoris: Secondary | ICD-10-CM

## 2017-02-24 HISTORY — PX: CORONARY ANGIOPLASTY WITH STENT PLACEMENT: SHX49

## 2017-02-24 HISTORY — PX: LEFT HEART CATH AND CORONARY ANGIOGRAPHY: CATH118249

## 2017-02-24 HISTORY — PX: CORONARY STENT INTERVENTION: CATH118234

## 2017-02-24 LAB — HIV ANTIBODY (ROUTINE TESTING W REFLEX): HIV Screen 4th Generation wRfx: NONREACTIVE

## 2017-02-24 LAB — LIPID PANEL
CHOL/HDL RATIO: 4.8 ratio
Cholesterol: 172 mg/dL (ref 0–200)
HDL: 36 mg/dL — ABNORMAL LOW (ref >40)
LDL CALC: 66 mg/dL (ref 0–99)
Triglycerides: 350 mg/dL — ABNORMAL HIGH (ref <150)
VLDL: 70 mg/dL — AB (ref 0–40)

## 2017-02-24 LAB — BASIC METABOLIC PANEL
ANION GAP: 10 (ref 5–15)
BUN: 18 mg/dL (ref 6–20)
CHLORIDE: 103 mmol/L (ref 101–111)
CO2: 26 mmol/L (ref 22–32)
Calcium: 8.8 mg/dL — ABNORMAL LOW (ref 8.9–10.3)
Creatinine, Ser: 1.02 mg/dL — ABNORMAL HIGH (ref 0.44–1.00)
GFR calc Af Amer: >60 mL/min (ref >60)
GFR calc non Af Amer: 57 mL/min — ABNORMAL LOW (ref >60)
GLUCOSE: 94 mg/dL (ref 65–99)
POTASSIUM: 3.6 mmol/L (ref 3.5–5.1)
Sodium: 139 mmol/L (ref 135–145)

## 2017-02-24 LAB — HEPARIN LEVEL (UNFRACTIONATED): Heparin Unfractionated: 0.17 IU/mL — ABNORMAL LOW (ref 0.30–0.70)

## 2017-02-24 LAB — TROPONIN I
TROPONIN I: 32.5 ng/mL — AB (ref <0.03)
Troponin I: 5.08 ng/mL (ref <0.03)
Troponin I: 42.29 ng/mL (ref <0.03)

## 2017-02-24 LAB — POCT ACTIVATED CLOTTING TIME: Activated Clotting Time: 461 seconds

## 2017-02-24 SURGERY — LEFT HEART CATH AND CORONARY ANGIOGRAPHY
Anesthesia: LOCAL

## 2017-02-24 MED ORDER — IOPAMIDOL (ISOVUE-370) INJECTION 76%
INTRAVENOUS | Status: DC | PRN
  Administered 2017-02-24: 285 mL via INTRA_ARTERIAL

## 2017-02-24 MED ORDER — IOPAMIDOL (ISOVUE-370) INJECTION 76%
INTRAVENOUS | Status: AC
  Filled 2017-02-24: qty 100

## 2017-02-24 MED ORDER — VERAPAMIL HCL 2.5 MG/ML IV SOLN
INTRAVENOUS | Status: AC
  Filled 2017-02-24: qty 2

## 2017-02-24 MED ORDER — NITROGLYCERIN 1 MG/10 ML FOR IR/CATH LAB
INTRA_ARTERIAL | Status: AC
  Filled 2017-02-24: qty 10

## 2017-02-24 MED ORDER — HEPARIN (PORCINE) IN NACL 2-0.9 UNIT/ML-% IJ SOLN
INTRAMUSCULAR | Status: AC
  Filled 2017-02-24: qty 1000

## 2017-02-24 MED ORDER — HEPARIN (PORCINE) IN NACL 2-0.9 UNIT/ML-% IJ SOLN
INTRAMUSCULAR | Status: DC | PRN
  Administered 2017-02-24: 1000 mL

## 2017-02-24 MED ORDER — NITROGLYCERIN IN D5W 200-5 MCG/ML-% IV SOLN
INTRAVENOUS | Status: AC
  Filled 2017-02-24: qty 250

## 2017-02-24 MED ORDER — HEPARIN SODIUM (PORCINE) 1000 UNIT/ML IJ SOLN
INTRAMUSCULAR | Status: AC
  Filled 2017-02-24: qty 1

## 2017-02-24 MED ORDER — SODIUM CHLORIDE 0.9 % IV SOLN: 250.0000 mL | INTRAVENOUS | Status: DC | PRN

## 2017-02-24 MED ORDER — MIDAZOLAM HCL 2 MG/2ML IJ SOLN
INTRAMUSCULAR | Status: AC
  Filled 2017-02-24: qty 2

## 2017-02-24 MED ORDER — TICAGRELOR 90 MG PO TABS
90.0000 mg | ORAL_TABLET | Freq: Two times a day (BID) | ORAL | Status: DC
  Administered 2017-02-24 – 2017-02-25 (×2): 90 mg via ORAL
  Filled 2017-02-24 (×2): qty 1

## 2017-02-24 MED ORDER — BIVALIRUDIN BOLUS VIA INFUSION - CUPID
INTRAVENOUS | Status: DC | PRN
  Administered 2017-02-24: 66.45 mg via INTRAVENOUS

## 2017-02-24 MED ORDER — ACETAMINOPHEN 325 MG PO TABS: 650.0000 mg | ORAL_TABLET | ORAL | Status: DC | PRN

## 2017-02-24 MED ORDER — BIVALIRUDIN 250 MG IV SOLR
INTRAVENOUS | Status: AC
  Filled 2017-02-24: qty 250

## 2017-02-24 MED ORDER — LIDOCAINE HCL (PF) 1 % IJ SOLN
INTRAMUSCULAR | Status: DC | PRN
  Administered 2017-02-24: 2 mL

## 2017-02-24 MED ORDER — IOPAMIDOL (ISOVUE-370) INJECTION 76%
INTRAVENOUS | Status: AC
  Filled 2017-02-24: qty 50

## 2017-02-24 MED ORDER — MIDAZOLAM HCL 2 MG/2ML IJ SOLN
INTRAMUSCULAR | Status: DC | PRN
  Administered 2017-02-24: 1 mg via INTRAVENOUS
  Administered 2017-02-24: 2 mg via INTRAVENOUS
  Administered 2017-02-24: 1 mg via INTRAVENOUS

## 2017-02-24 MED ORDER — SODIUM CHLORIDE 0.9% FLUSH: 3.0000 mL | INTRAVENOUS | Status: DC | PRN

## 2017-02-24 MED ORDER — NITROGLYCERIN 1 MG/10 ML FOR IR/CATH LAB
INTRA_ARTERIAL | Status: DC | PRN
  Administered 2017-02-24 (×3): 200 ug via INTRACORONARY

## 2017-02-24 MED ORDER — TICAGRELOR 90 MG PO TABS
ORAL_TABLET | ORAL | Status: DC | PRN
  Administered 2017-02-24: 180 mg via ORAL

## 2017-02-24 MED ORDER — HEPARIN BOLUS VIA INFUSION
2000.0000 [IU] | Freq: Once | INTRAVENOUS | Status: AC
  Administered 2017-02-24: 2000 [IU] via INTRAVENOUS
  Filled 2017-02-24: qty 2000

## 2017-02-24 MED ORDER — HEPARIN (PORCINE) IN NACL 2-0.9 UNIT/ML-% IJ SOLN
INTRAMUSCULAR | Status: DC | PRN
  Administered 2017-02-24: 8 mL via INTRA_ARTERIAL

## 2017-02-24 MED ORDER — HEPARIN SODIUM (PORCINE) 1000 UNIT/ML IJ SOLN
INTRAMUSCULAR | Status: DC | PRN
  Administered 2017-02-24: 4500 [IU] via INTRAVENOUS

## 2017-02-24 MED ORDER — ONDANSETRON HCL 4 MG/2ML IJ SOLN: 4.0000 mg | Freq: Four times a day (QID) | INTRAMUSCULAR | Status: DC | PRN

## 2017-02-24 MED ORDER — HYDRALAZINE HCL 20 MG/ML IJ SOLN: 5.0000 mg | INTRAMUSCULAR | Status: AC | PRN

## 2017-02-24 MED ORDER — FENTANYL CITRATE (PF) 100 MCG/2ML IJ SOLN
INTRAMUSCULAR | Status: DC | PRN
  Administered 2017-02-24 (×3): 25 ug via INTRAVENOUS

## 2017-02-24 MED ORDER — TICAGRELOR 90 MG PO TABS
ORAL_TABLET | ORAL | Status: AC
  Filled 2017-02-24: qty 2

## 2017-02-24 MED ORDER — DIAZEPAM 5 MG PO TABS: 5.0000 mg | ORAL_TABLET | Freq: Four times a day (QID) | ORAL | Status: DC | PRN

## 2017-02-24 MED ORDER — NITROGLYCERIN IN D5W 200-5 MCG/ML-% IV SOLN
2.0000 ug/min | INTRAVENOUS | Status: DC
  Administered 2017-02-24: 10 ug/min via INTRAVENOUS

## 2017-02-24 MED ORDER — LIDOCAINE HCL (PF) 1 % IJ SOLN
INTRAMUSCULAR | Status: AC
  Filled 2017-02-24: qty 30

## 2017-02-24 MED ORDER — LABETALOL HCL 5 MG/ML IV SOLN: 10.0000 mg | INTRAVENOUS | Status: AC | PRN

## 2017-02-24 MED ORDER — FENTANYL CITRATE (PF) 100 MCG/2ML IJ SOLN
INTRAMUSCULAR | Status: AC
  Filled 2017-02-24: qty 2

## 2017-02-24 MED ORDER — SODIUM CHLORIDE 0.9% FLUSH
3.0000 mL | Freq: Two times a day (BID) | INTRAVENOUS | Status: DC
  Administered 2017-02-24: 3 mL via INTRAVENOUS

## 2017-02-24 MED ORDER — SODIUM CHLORIDE 0.9 % IV SOLN
INTRAVENOUS | Status: DC | PRN
  Administered 2017-02-24: 1.75 mg/kg/h via INTRAVENOUS

## 2017-02-24 MED ORDER — ASPIRIN 81 MG PO CHEW: 81.0000 mg | CHEWABLE_TABLET | Freq: Every day | ORAL | Status: DC

## 2017-02-24 MED ORDER — ANGIOPLASTY BOOK
Freq: Once | Status: AC
  Administered 2017-02-24: 16:00:00
  Filled 2017-02-24: qty 1

## 2017-02-24 MED ORDER — HEART ATTACK BOUNCING BOOK
Freq: Once | Status: AC
  Administered 2017-02-24: 16:00:00
  Filled 2017-02-24: qty 1

## 2017-02-24 MED ORDER — SODIUM CHLORIDE 0.9 % IV SOLN
INTRAVENOUS | Status: DC
  Administered 2017-02-24: 16:00:00 via INTRAVENOUS

## 2017-02-24 SURGICAL SUPPLY — 20 items
BALLN EUPHORA RX 2.0X12 (BALLOONS) ×2
BALLN ~~LOC~~ EUPHORA RX 2.75X15 (BALLOONS) ×2
BALLOON EUPHORA RX 2.0X12 (BALLOONS) IMPLANT
BALLOON ~~LOC~~ EUPHORA RX 2.75X15 (BALLOONS) IMPLANT
CATH INFINITI 5FR ANG PIGTAIL (CATHETERS) ×1 IMPLANT
CATH OPTITORQUE TIG 4.0 5F (CATHETERS) ×1 IMPLANT
COVER PRB 48X5XTLSCP FOLD TPE (BAG) IMPLANT
COVER PROBE 5X48 (BAG) ×2
DEVICE RAD COMP TR BAND LRG (VASCULAR PRODUCTS) ×1 IMPLANT
GLIDESHEATH SLEND SS 6F .021 (SHEATH) ×1 IMPLANT
GUIDEWIRE INQWIRE 1.5J.035X260 (WIRE) IMPLANT
INQWIRE 1.5J .035X260CM (WIRE) ×2
KIT ENCORE 26 ADVANTAGE (KITS) ×1 IMPLANT
KIT HEART LEFT (KITS) ×2 IMPLANT
PACK CARDIAC CATHETERIZATION (CUSTOM PROCEDURE TRAY) ×2 IMPLANT
STENT RESOLUTE ONYX 2.5X26 (Permanent Stent) ×1 IMPLANT
SYR MEDRAD MARK V 150ML (SYRINGE) ×2 IMPLANT
TRANSDUCER W/STOPCOCK (MISCELLANEOUS) ×2 IMPLANT
TUBING CIL FLEX 10 FLL-RA (TUBING) ×2 IMPLANT
WIRE COUGAR XT STRL 190CM (WIRE) ×1 IMPLANT

## 2017-02-24 NOTE — Progress Notes (Signed)
CRITICAL VALUE ALERT  Critical value received:  Troponin 42.29   Date of notification:  02/24/17   Time of notification:  1388  Critical value read back:Yes.    Nurse who received alert:  N.Latrise Bowland  MD notified (1st page):  Rosaria Ferries PA  Time of first page:  1627  MD notified (2nd page):  Time of second page:  Responding MD:  Rosaria Ferries PA   Time MD responded:  [phone]

## 2017-02-24 NOTE — Progress Notes (Signed)
CRITICAL VALUE ALERT  Critical value received:  Troponin 1.01  Date of notification:  02/23/17  Time of notification:  2200  Critical value read back:Yes.    Nurse who received alert:  Garey Ham, RN  MD notified (1st page):  Dr. Raiford Simmonds  Time of first page:  2215  MD notified (2nd page):  Time of second page:  Responding MD:  Dr. Raiford Simmonds  Time MD responded:  2230

## 2017-02-24 NOTE — Care Management Note (Signed)
Case Management Note  Patient Details  Name: [name] [name] MRN: [id number] Date of Birth: [birth year]  Subjective/Objective:    From home with spouse, pta indep, s/p coronary stent intervention, will be on brilinta, her pharmacy [facility] [address] has this in stock. NCM gave patient the 30 day savings card.                  Action/Plan:   Expected Discharge Date:                  Expected Discharge Plan:  Home/Self Care  In-House Referral:     Discharge planning Services  CM Consult  Post Acute Care Choice:    Choice offered to:     DME Arranged:    DME Agency:     HH Arranged:    HH Agency:     Status of Service:  Completed, signed off  If discussed at [employer] of Stay Meetings, dates discussed:    Additional Comments:  Zenon Mayo, RN 02/24/2017, 3:03 PM

## 2017-02-24 NOTE — Progress Notes (Signed)
RN called because troponin elevated to 42.29.  Pt with no chest pain. ECG reviewed. ST/T wave changes c/w post-MI changes but will get MD to review.  Continue current care. Call for symptoms.  Lenoard Aden 02/24/2017 5:28 PM Beeper [phone]

## 2017-02-24 NOTE — Interval H&P Note (Signed)
Cath Lab Visit (complete for each Cath Lab visit)  Clinical Evaluation Leading to the Procedure:   ACS: Yes.    Non-ACS:    Anginal Classification: CCS IV  Anti-ischemic medical therapy: Maximal Therapy (2 or more classes of medications)  Non-Invasive Test Results: No non-invasive testing performed  Prior CABG: No previous CABG      History and Physical Interval Note:  02/24/2017 9:12 AM  [name] [name]  has presented today for surgery, with the diagnosis of NSTEMI  The various methods of treatment have been discussed with the patient and family. After consideration of risks, benefits and other options for treatment, the patient has consented to  Procedure(s): Left Heart Cath and Coronary Angiography (N/A) as a surgical intervention .  The patient's history has been reviewed, patient examined, no change in status, stable for surgery.  I have reviewed the patient's chart and labs.  Questions were answered to the patient's satisfaction.     Shelva Majestic

## 2017-02-24 NOTE — Progress Notes (Signed)
Upon assessment this am, pt having chest pressure but no pain. I notified Dr. Stanford Breed and orders for IV nitro Gtt received and I hung it. Pt bumped up on cath lab schedule and I transported pt to cath lab on monitor. No ectopy and chest pressure "decreased" per pt at arrival to cath lab. Family at bedside

## 2017-02-24 NOTE — Progress Notes (Signed)
ANTICOAGULATION CONSULT NOTE - Follow Up Consult  Pharmacy Consult for heparin Indication: NSTEMI/USAP  Labs:  Recent Labs  02/23/17 1800 02/23/17 2106 02/24/17 0324  HGB 13.0  --   --   HCT 41.0  --   --   PLT 224  --   --   LABPROT  --  13.7  --   INR  --  1.05  --   HEPARINUNFRC  --   --  0.17*  CREATININE 1.04*  --   --   TROPONINI 0.14* 1.01*  --     Assessment: 66yo female subtherapeutic on heparin with initial dosing for NSTEMI.  Goal of Therapy:  Heparin level 0.3-0.7 units/ml   Plan:  Will rebolus with heparin 2000 units and increase gtt by 3 units/kg/hr to 1100 units/hr and check level in 6hr.  [name] [name], PharmD, BCPS  02/24/2017,4:21 AM

## 2017-02-24 NOTE — H&P (Signed)
Unstable angina  HPI:  This is a 66 y.o. female with a past medical history significant for hypertension and hypercholesterolemia and a strong family history of premature coronary artery disease and death from cardiac illness presenting with roughly 48 hours of stuttering chest pain. The symptoms have occurred off and on both at rest and with activity, but are clearly worsened by even light physical activity. She has pressure that radiates across the entire anterior chest. Nitroglycerin provided prompt relief. At this point she only has barely noticeable chest heaviness 1/10.   There was accompanying dyspnea that was clearly worse with physical activity and worse when the chest tightness was more intense. She denies palpitations, dizziness, syncope, focal neurological events, leg pain or tenderness or swelling, cough or hemoptysis, pleurisy, headache, new focal neurological complaints.  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 The symptoms have never occurred in the past.  Her past medical history significant for kidney stones that have required lithotripsy. She has undergone several minor surgical procedures as well as a total abdominal hysterectomy. She takes estrogen supplements. She also takes daily nonsteroidal anti-inflammatory drugs for a variety of arthritis type aches and pains.   She already takes a statin and daily aspirin and also takes fenofibrate.  PMHx:      Past Medical History:  Diagnosis Date  . Arthritis   . High cholesterol   . Hypertension   . Kidney stones          Past Surgical History:  Procedure Laterality Date  . ABDOMINAL HYSTERECTOMY    . arthritic cyst removed Left 2002   index finger  . CARPAL TUNNEL RELEASE  1996  . CYSTOSCOPY Left 04/2006   kidney stone on the left obtained  . inflammed milk duct Right 1991   removal    FAMHx:  Her father had sudden death related to coronary disease in  his late 76s.  SOCHx:   reports that she has never smoked. She has never used smokeless tobacco. She reports that she does not drink alcohol. Her drug history is not on file.  ALLERGIES:       Allergies  Allergen Reactions  . Altace [Ramipril] Shortness Of Breath and Nausea Only    Chest pains, indigestion  . Benicar [Olmesartan] Shortness Of Breath and Nausea Only    Chest pains, indigestion  . Sulfa Antibiotics Rash    ROS: Pertinent items noted in HPI and remainder of comprehensive ROS otherwise negative.  HOME MEDS: No current facility-administered medications on file prior to encounter.          Current Outpatient Prescriptions on File Prior to Encounter  Medication Sig Dispense Refill  . aspirin 81 MG tablet Take 81 mg by mouth daily.    Marland Kitchen azelastine (ASTELIN) 137 MCG/SPRAY nasal spray Place 1 spray into the nose daily. Use in each nostril as directed     . DULoxetine (CYMBALTA) 30 MG capsule Take 30 mg by mouth daily.     Marland Kitchen estradiol (ESTRACE) 2 MG tablet Take 2 mg by mouth every morning.     . fenofibrate 160 MG tablet Take 160 mg by mouth daily.    . fish oil-omega-3 fatty acids 1000 MG capsule Take 1 g by mouth daily.     . meloxicam (MOBIC) 15 MG tablet Take 15 mg by mouth daily.    . Multiple Vitamin (MULTIVITAMIN WITH MINERALS) TABS tablet Take 1 tablet by mouth daily.    Marland Kitchen  omeprazole (PRILOSEC) 20 MG capsule Take 20 mg by mouth daily.    . rosuvastatin (CRESTOR) 20 MG tablet Take 20 mg by mouth every morning.     . traMADol (ULTRAM) 50 MG tablet Take 50 mg by mouth every 12 (twelve) hours as needed for moderate pain.       LABS/IMAGING: Lab Results Last 48 Hours        Results for orders placed or performed during the hospital encounter of 02/23/17 (from the past 48 hour(s))  Urinalysis, Routine w reflex microscopic     Status: Abnormal   Collection Time: 02/23/17  5:40 PM  Result Value Ref Range   Color, Urine YELLOW  YELLOW   APPearance HAZY (A) CLEAR   Specific Gravity, Urine 1.018 1.005 - 1.030   pH 6.0 5.0 - 8.0   Glucose, UA NEGATIVE NEGATIVE mg/dL   Hgb urine dipstick MODERATE (A) NEGATIVE   Bilirubin Urine NEGATIVE NEGATIVE   Ketones, ur NEGATIVE NEGATIVE mg/dL   Protein, ur 30 (A) NEGATIVE mg/dL   Nitrite NEGATIVE NEGATIVE   Leukocytes, UA NEGATIVE NEGATIVE   RBC / HPF TOO NUMEROUS TO COUNT 0 - 5 RBC/hpf   WBC, UA 0-5 0 - 5 WBC/hpf   Bacteria, UA RARE (A) NONE SEEN   Squamous Epithelial / LPF 0-5 (A) NONE SEEN   Mucous PRESENT   Comprehensive metabolic panel     Status: Abnormal   Collection Time: 02/23/17  6:00 PM  Result Value Ref Range   Sodium 140 135 - 145 mmol/L   Potassium 3.4 (L) 3.5 - 5.1 mmol/L   Chloride 104 101 - 111 mmol/L   CO2 26 22 - 32 mmol/L   Glucose, Bld 101 (H) 65 - 99 mg/dL   BUN 18 6 - 20 mg/dL   Creatinine, Ser 1.04 (H) 0.44 - 1.00 mg/dL   Calcium 9.3 8.9 - 10.3 mg/dL   Total Protein 6.4 (L) 6.5 - 8.1 g/dL   Albumin 3.7 3.5 - 5.0 g/dL   AST 38 15 - 41 U/L   ALT 28 14 - 54 U/L   Alkaline Phosphatase 44 38 - 126 U/L   Total Bilirubin 0.6 0.3 - 1.2 mg/dL   GFR calc non Af Amer 56 (L) >60 mL/min   GFR calc Af Amer >60 >60 mL/min    Comment: (NOTE) The eGFR has been calculated using the CKD EPI equation. This calculation has not been validated in all clinical situations. eGFR's persistently <60 mL/min signify possible Chronic Kidney Disease.    Anion gap 10 5 - 15  CBC     Status: None   Collection Time: 02/23/17  6:00 PM  Result Value Ref Range   WBC 5.1 4.0 - 10.5 K/uL   RBC 4.40 3.87 - 5.11 MIL/uL   Hemoglobin 13.0 12.0 - 15.0 g/dL   HCT 41.0 36.0 - 46.0 %   MCV 93.2 78.0 - 100.0 fL   MCH 29.5 26.0 - 34.0 pg   MCHC 31.7 30.0 - 36.0 g/dL   RDW 13.5 11.5 - 15.5 %   Platelets 224 150 - 400 K/uL  Troponin I  (0, 3, 6)     Status: Abnormal   Collection Time: 02/23/17  6:00 PM  Result Value Ref Range    Troponin I 0.14 (HH) <0.03 ng/mL    Comment: CRITICAL RESULT CALLED TO, READ BACK BY AND VERIFIED WITH: C NUCKLES,RN 1930 02/23/2017 WBOND       Imaging Results (Last 48 hours)  Dg  Chest 2 View  Result Date: 02/23/2017 CLINICAL DATA:  Chest pain intermittently beginning yesterday. EXAM: CHEST  2 VIEW COMPARISON:  02/08/2013 FINDINGS: Lungs are adequately inflated without focal airspace consolidation or effusion. Cardiomediastinal silhouette is within normal. There are mild degenerate changes of the spine. IMPRESSION: No active cardiopulmonary disease. Electronically Signed   By: Marin Olp M.D.   On: 02/23/2017 18:33     ECG Normal sinus rhythm. The tracing is of very poor technical quality, nevertheless there is evidence of ST segment depression and T-wave inversion in leads V3-V6 as well as the inferior leads.  VITALS: Blood pressure 125/79, pulse 65, temperature 97.9 F (36.6 C), temperature source Oral, resp. rate 13, height '5\' 4"'$  (1.626 m), weight 86.2 kg (190 lb), SpO2 100 %.  EXAM:  General: Alert, oriented x3, no distress Head: no evidence of trauma, PERRL, EOMI, no exophtalmos or lid lag, no myxedema, no xanthelasma; normal ears, nose and oropharynx Neck: Normal jugular venous pulsations and no hepatojugular reflux; brisk carotid pulses without delay and no carotid bruits Chest: clear to auscultation, no signs of consolidation by percussion or palpation, normal fremitus, symmetrical and full respiratory excursions Cardiovascular: normal position and quality of the apical impulse, regular rhythm, normal first heart sound and normal second heart sounds, no rubs or gallops, no murmur Abdomen: no tenderness or distention, no masses by palpation, no abnormal pulsatility or arterial bruits, normal bowel sounds, no hepatosplenomegaly Extremities: no clubbing, cyanosis or edema; 2+ radial, ulnar and brachial pulses bilaterally; 2+ right femoral, posterior tibial and dorsalis  pedis pulses; 2+ left femoral, posterior tibial and dorsalis pedis pulses; no subclavian or femoral bruits Neurological: grossly nonfocal   IMPRESSION: Unstable angina/small non-ST segment elevation myocardial infarction in a relatively young, but postmenopausal woman with hypertension and hyperlipidemia. High-risk features are present with ST segment depression and mildly abnormal cardiac enzymes.  She appears to have severe mixed hyperlipidemia, but a lipid profile is currently not available for review. Plan to continue highly active statin. She will probably have to discontinue her NSAIDs and preferably will wean off all estrogen supplements.  PLAN: Plan coronary angiography in a.m., emergency after and revascularization if symptoms worsen overnight and frank ST segment elevation develops. This procedure has been fully reviewed with the patient and written informed consent has been obtained. Start beta blockers. Hold ARB and hydrochlorothiazide. Replete potassium. Recheck enzymes every 6 hours. Repeat an ECG especially since the initial tracing is of poor technical quality. Echocardiogram tomorrow. Review lipid profile. Target LDL-C<70.  Mrs. [name] [name] daughter-in-law Alyse Low works on [place]. in [facility].  Sanda Klein, MD, [facility] [phone] office [phone] pager  02/23/2017, 8:07 PM

## 2017-02-24 NOTE — Progress Notes (Signed)
Progress Note  Patient Name: [name] [name] Date of Encounter: 02/24/2017  Primary Cardiologist: Dr Sallyanne Kuster  Subjective   Still with mild chest pressure; no dyspnea  Inpatient Medications    Scheduled Meds: . aspirin EC  81 mg Oral Daily  . carvedilol  3.125 mg Oral BID WC  . DULoxetine  30 mg Oral Daily  . fenofibrate  160 mg Oral Daily  . pantoprazole  40 mg Oral Daily  . rosuvastatin  20 mg Oral q morning - 10a  . sodium chloride flush  3 mL Intravenous Q12H   Continuous Infusions: . sodium chloride    . heparin 1,100 Units/hr (02/24/17 0501)   PRN Meds: sodium chloride, acetaminophen, nitroGLYCERIN, ondansetron (ZOFRAN) IV, sodium chloride flush   Vital Signs    Vitals:   02/23/17 2223 02/23/17 2252 02/23/17 2318 02/24/17 0506  BP: (!) 117/58 100/89 101/88 124/70  Pulse: 74 63 68 74  Resp: 15 17 18 20   Temp: 98.3 F (36.8 C)   97.8 F (36.6 C)  TempSrc: Oral   Oral  SpO2: 98% 98% 100% 97%  Weight:    195 lb 4.8 oz (88.6 kg)  Height:        Intake/Output Summary (Last 24 hours) at 02/24/17 0749 Last data filed at 02/23/17 2359  Gross per 24 hour  Intake           264.45 ml  Output              200 ml  Net            64.45 ml   Filed Weights   02/23/17 1742 02/24/17 0506  Weight: 190 lb (86.2 kg) 195 lb 4.8 oz (88.6 kg)    Telemetry    Sinus- Personally Reviewed   Physical Exam   GEN: No acute distress.   Neck: Supple Cardiac: RRR, no murmurs, rubs, or gallops.  Respiratory: CTA GI: Soft, nontender, non-distended  MS: No edema Neuro:  Nonfocal  Psych: Normal affect   Labs    Chemistry Recent Labs Lab 02/23/17 1800 02/24/17 0324  NA 140 139  K 3.4* 3.6  CL 104 103  CO2 26 26  GLUCOSE 101* 94  BUN 18 18  CREATININE 1.04* 1.02*  CALCIUM 9.3 8.8*  PROT 6.4*  --   ALBUMIN 3.7  --   AST 38  --   ALT 28  --   ALKPHOS 44  --   BILITOT 0.6  --   GFRNONAA 56* 57*  GFRAA >60 >60  ANIONGAP 10 10     Hematology Recent  Labs Lab 02/23/17 1800  WBC 5.1  RBC 4.40  HGB 13.0  HCT 41.0  MCV 93.2  MCH 29.5  MCHC 31.7  RDW 13.5  PLT 224    Cardiac Enzymes Recent Labs Lab 02/23/17 1800 02/23/17 2106 02/24/17 0324  TROPONINI 0.14* 1.01* 5.08*    Radiology    Dg Chest 2 View  Result Date: 02/23/2017 CLINICAL DATA:  Chest pain intermittently beginning yesterday. EXAM: CHEST  2 VIEW COMPARISON:  02/08/2013 FINDINGS: Lungs are adequately inflated without focal airspace consolidation or effusion. Cardiomediastinal silhouette is within normal. There are mild degenerate changes of the spine. IMPRESSION: No active cardiopulmonary disease. Electronically Signed   By: Marin Olp M.D.   On: 02/23/2017 18:33    Patient Profile     66 y.o. female admitted with NSTEMI  Assessment & Plan    1 non-ST elevation myocardial infarction-patient continues with  mild chest pressure as morning. Her enzymes are positive. We will plan to proceed with cardiac catheterization now. The risks and benefits including myocardial infarction, CVA and death discussed and she agrees to proceed. Continue aspirin, beta blocker, heparin and statin. Await echocardiogram.  2 hyperlipidemia-continue statin. She may benefit from lipid clinic as an outpatient.  3 hypertension-blood pressure controlled. Continue present medications.  Signed, Kirk Ruths, MD  02/24/2017, 7:49 AM

## 2017-02-24 NOTE — Progress Notes (Signed)
Right TR Band removed without any difficulty and clean dry drsg applied Pt's site was level 0 on arrival to unit and remains level 0 after band removed. CSM wnl.  No hematoma or bleeding. Palpable 2t radial and ulnar pulses. Pt instructed on post tr band instructions and verbalized understanding of such. Pt tol well.

## 2017-02-25 ENCOUNTER — Inpatient Hospital Stay (HOSPITAL_COMMUNITY): Payer: BLUE CROSS/BLUE SHIELD

## 2017-02-25 DIAGNOSIS — Z8249 Family history of ischemic heart disease and other diseases of the circulatory system: Secondary | ICD-10-CM

## 2017-02-25 LAB — CBC
HCT: 40.4 % (ref 36.0–46.0)
Hemoglobin: 12.8 g/dL (ref 12.0–15.0)
MCH: 29.6 pg (ref 26.0–34.0)
MCHC: 31.7 g/dL (ref 30.0–36.0)
MCV: 93.5 fL (ref 78.0–100.0)
PLATELETS: 204 10*3/uL (ref 150–400)
RBC: 4.32 MIL/uL (ref 3.87–5.11)
RDW: 13.6 % (ref 11.5–15.5)
WBC: 5.9 10*3/uL (ref 4.0–10.5)

## 2017-02-25 LAB — BASIC METABOLIC PANEL
ANION GAP: 6 (ref 5–15)
BUN: 12 mg/dL (ref 6–20)
CALCIUM: 9 mg/dL (ref 8.9–10.3)
CO2: 23 mmol/L (ref 22–32)
Chloride: 109 mmol/L (ref 101–111)
Creatinine, Ser: 0.84 mg/dL (ref 0.44–1.00)
GFR calc Af Amer: >60 mL/min (ref >60)
Glucose, Bld: 108 mg/dL — ABNORMAL HIGH (ref 65–99)
POTASSIUM: 3.8 mmol/L (ref 3.5–5.1)
Sodium: 138 mmol/L (ref 135–145)

## 2017-02-25 LAB — TROPONIN I
TROPONIN I: 17.97 ng/mL — AB (ref <0.03)
TROPONIN I: 17.65 ng/mL — AB (ref <0.03)

## 2017-02-25 LAB — HEMOGLOBIN A1C
Hgb A1c MFr Bld: 5.5 % (ref 4.8–5.6)
Mean Plasma Glucose: 111 mg/dL

## 2017-02-25 MED ORDER — PANTOPRAZOLE SODIUM 40 MG PO TBEC: 40.0000 mg | DELAYED_RELEASE_TABLET | Freq: Every day | ORAL | 6 refills | Status: DC

## 2017-02-25 MED ORDER — NITROGLYCERIN 0.4 MG SL SUBL: 0.4000 mg | SUBLINGUAL_TABLET | SUBLINGUAL | 12 refills | Status: DC | PRN

## 2017-02-25 MED ORDER — TICAGRELOR 90 MG PO TABS: 90.0000 mg | ORAL_TABLET | Freq: Two times a day (BID) | ORAL | 11 refills | Status: DC

## 2017-02-25 NOTE — Progress Notes (Signed)
CARDIAC REHAB PHASE I   PRE:  Rate/Rhythm: 78 SR    BP: sitting 132/60    SaO2:   MODE:  Ambulation: 400 ft   POST:  Rate/Rhythm: 95 SR    BP: sitting 152/66     SaO2:   Pt SOB with walking and talking at 200 ft. Had her rest. Again SOB on return trip. Still able to talk but somewhat breathless. Resolved with rest. To recliner. Ed completed with pt and husband. Voiced understanding. Understands importance of Brilinta. Will refer to [place], ACSM 02/25/2017 8:44 AM

## 2017-02-25 NOTE — Discharge Summary (Addendum)
Discharge Summary    Patient ID: GAE BIHL,  MRN: [id number], DOB/AGE: 64-Mar-1952 64 y.o.  Admit date: 02/23/2017 Discharge date: 02/25/2017  Primary Care Provider: Prince Solian R Primary Cardiologist: Dr. Sallyanne Kuster  Discharge Diagnoses    Active Problems:   NSTEMI (non-ST elevated myocardial infarction) ([place])  Allergies Allergies  Allergen Reactions  . Altace [Ramipril] Shortness Of Breath and Nausea Only    Chest pains, indigestion  . Benicar [Olmesartan] Shortness Of Breath and Nausea Only    Chest pains, indigestion  . Sulfa Antibiotics Rash   Diagnostic Studies/Procedures    Cardiac catheterization 02/24/2017  Ramus lesion, 60 %stenosed.  A STENT RESOLUTE ONYX 2.5X26 drug eluting stent was successfully placed.  Ost Ramus lesion, 100 %stenosed.  Post intervention, there is a 0% residual stenosis.   Acute coronary syndrome/NSTEMI secondary to total very proximal occlusion of the ramus intermediate vessel.  Normal LAD, left circumflex, and large dominant RCA.  Mild LV dysfunction with an EF of 50% and mild mid anterolateral hypocontractility.  Successful  percutaneous coronary intervention to the ramus intermediate vessel with ultimate insertion of a 2.526 mm Resolute Onyx DES stent postdilated to 2.71 mm with the 100% occlusion being reduced to 0% and resumption of brisk TIMI-3 flow.  RECOMMENDATION: The patient should continue on DAPT a minimum of 1 year.  Post MI ACE-I/ARB,  blocker, and high potency statin therapy will be initiated.  Diagnostic Diagram       Post-Intervention Diagram        _____________   History of Present Illness     This is a 66 y.o.femalewith a past medical history significant forhypertension and hypercholesterolemia and a strong family history of premature coronary artery disease and death from cardiac illness presenting with roughly 48 hours of stuttering chest pain. The symptoms occurred off and on both at  rest and with activity, but clearly worsened by even light physical activity. She was admitted on 02/23/17 for ACS.   Hospital Course     Consultants: None  Patient ruled in for non-STEMI with max troponin 42.29. EKG had ST segment depression and T-wave inversion in leads V3-V6 as well as inferior leads. She was taken to the cath lab on 3/23 and found to have 100% stenosed Ramus. Successful  percutaneous coronary intervention to the ramus intermediate vessel was accomplished with ultimate insertion of a 2.526 mm Resolute Onyx DES stent.  Plan is to discontinue NSAIDs and preferably wean off estrogen supplements. She needs DAPT therapy with aspirin and Brilinta for 1 year, beta blocker, ACE-I/ARB and high potency statin. Target LDL <70.  Lipid panel (02/24/17) showed LDL 66, triglycerides 350 _____________ Physical Exam  Constitutional: She is oriented to person, place, and time. She appears well-developed and well-nourished.  HENT:  Head: Normocephalic and atraumatic.  Eyes: No scleral icterus.  Neck: Normal range of motion. No JVD present.  Cardiovascular: Normal rate, regular rhythm and normal heart sounds.   Pulmonary/Chest: Effort normal and breath sounds normal. She has no wheezes. She has no rales.  Abdominal: Soft. Bowel sounds are normal.  Musculoskeletal: Normal range of motion. She exhibits no edema.  Neurological: She is alert and oriented to person, place, and time.  Skin: Skin is warm and dry.  Psychiatric: She has a normal mood and affect.   Right wrist without hematoma.  Discharge Vitals Blood pressure 132/60, pulse 70, temperature 98 F (36.7 C), temperature source Oral, resp. rate 18, height 5\' 4"  (1.626 m), weight 198 lb 13.7  oz (90.2 kg), SpO2 97 %.  Filed Weights   02/23/17 1742 02/24/17 0506 02/25/17 0235  Weight: 190 lb (86.2 kg) 195 lb 4.8 oz (88.6 kg) 198 lb 13.7 oz (90.2 kg)    Labs & Radiologic Studies    CBC  Recent Labs  02/23/17 1800  02/25/17 0725  WBC 5.1 5.9  HGB 13.0 12.8  HCT 41.0 40.4  MCV 93.2 93.5  PLT 224 245   Basic Metabolic Panel  Recent Labs  02/24/17 0324 02/25/17 0725  NA 139 138  K 3.6 3.8  CL 103 109  CO2 26 23  GLUCOSE 94 108*  BUN 18 12  CREATININE 1.02* 0.84  CALCIUM 8.8* 9.0   Liver Function Tests  Recent Labs  02/23/17 1800  AST 38  ALT 28  ALKPHOS 44  BILITOT 0.6  PROT 6.4*  ALBUMIN 3.7   No results for input(s): LIPASE, AMYLASE in the last 72 hours. Cardiac Enzymes  Recent Labs  02/24/17 1936 02/25/17 0219 02/25/17 0725  TROPONINI 32.50* 17.97* 17.65*   BNP Invalid input(s): POCBNP D-Dimer No results for input(s): DDIMER in the last 72 hours. Hemoglobin A1C  Recent Labs  02/24/17 0325  HGBA1C 5.5   Fasting Lipid Panel  Recent Labs  02/24/17 0324  CHOL 172  HDL 36*  LDLCALC 66  TRIG 350*  CHOLHDL 4.8   Thyroid Function Tests No results for input(s): TSH, T4TOTAL, T3FREE, THYROIDAB in the last 72 hours.  Invalid input(s): FREET3 _____________  Dg Chest 2 View  Result Date: 02/23/2017 CLINICAL DATA:  Chest pain intermittently beginning yesterday. EXAM: CHEST  2 VIEW COMPARISON:  02/08/2013 FINDINGS: Lungs are adequately inflated without focal airspace consolidation or effusion. Cardiomediastinal silhouette is within normal. There are mild degenerate changes of the spine. IMPRESSION: No active cardiopulmonary disease. Electronically Signed   By: Marin Olp M.D.   On: 02/23/2017 18:33   Disposition   Pt is being discharged home today in good condition.  Follow-up Plans & Appointments    Follow-up Information    Sanda Klein, MD Follow up.   Specialty:  Cardiology Why:  [facility] office will call you to arrange hospital cardiology follow up.  Contact information: [address] [place] [address] [phone]          Discharge Instructions    Amb Referral to Cardiac Rehabilitation    Complete by:  As  directed    Diagnosis:   Coronary Stents NSTEMI PTCA     Diet - low sodium heart healthy    Complete by:  As directed    Discharge instructions    Complete by:  As directed    NO HEAVY LIFTING (>10lbs) X 2 WEEKS. NO SEXUAL ACTIVITY X 2 WEEKS. NO DRIVING X 1 WEEK. NO SOAKING BATHS, HOT TUBS, POOLS, ETC., X 7 DAYS.  Some studies suggest Prilosec/Omeprazole interacts with Plavix. We changed your Prilosec/Omeprazole to Protonix for less chance of interaction.   Increase activity slowly    Complete by:  As directed       Discharge Medications   Current Discharge Medication List    START taking these medications   Details  nitroGLYCERIN (NITROSTAT) 0.4 MG SL tablet Place 1 tablet (0.4 mg total) under the tongue every 5 (five) minutes x 3 doses as needed for chest pain. Qty: 25 tablet, Refills: 12    pantoprazole (PROTONIX) 40 MG tablet Take 1 tablet (40 mg total) by mouth daily. Qty: 30 tablet, Refills: 6  ticagrelor (BRILINTA) 90 MG TABS tablet Take 1 tablet (90 mg total) by mouth 2 (two) times daily. Qty: 180 tablet, Refills: 11      CONTINUE these medications which have NOT CHANGED   Details  aspirin 81 MG tablet Take 81 mg by mouth daily.    azelastine (ASTELIN) 137 MCG/SPRAY nasal spray Place 1 spray into the nose daily. Use in each nostril as directed     DULoxetine (CYMBALTA) 30 MG capsule Take 30 mg by mouth daily.     estradiol (ESTRACE) 2 MG tablet Take 2 mg by mouth every morning.     fenofibrate 160 MG tablet Take 160 mg by mouth daily.    fish oil-omega-3 fatty acids 1000 MG capsule Take 1 g by mouth daily.     Glucosamine-Chondroit-Vit C-Mn (GLUCOSAMINE 1500 COMPLEX) CAPS Take 1 capsule by mouth daily.    irbesartan-hydrochlorothiazide (AVALIDE) 300-12.5 MG tablet Take 1 tablet by mouth daily.    levocetirizine (XYZAL) 5 MG tablet Take 5 mg by mouth every evening.    Multiple Vitamin (MULTIVITAMIN WITH MINERALS) TABS tablet Take 1 tablet by mouth  daily.    rosuvastatin (CRESTOR) 20 MG tablet Take 20 mg by mouth every morning.     traMADol (ULTRAM) 50 MG tablet Take 50 mg by mouth every 12 (twelve) hours as needed for moderate pain.       STOP taking these medications     meloxicam (MOBIC) 15 MG tablet      omeprazole (PRILOSEC) 20 MG capsule          Aspirin prescribed at discharge?  Yes High Intensity Statin Prescribed? (Lipitor 40-80mg  or Crestor 20-40mg ): Yes Beta Blocker Prescribed? Yes For EF <40%, was ACEI/ARB Prescribed? Yes ADP Receptor Inhibitor Prescribed? (i.e. Plavix etc.-Includes Medically Managed Patients): Yes For EF <40%, Aldosterone Inhibitor Prescribed? No: N/A Was EF assessed during THIS hospitalization? No: Will assess as outpatient Was Cardiac Rehab II ordered? (Included Medically managed Patients): Yes   Outstanding Labs/Studies   Patient needs echocardiogram  Duration of Discharge Encounter   Greater than 30 minutes including physician time.  Signed, Ena Dawley NP 02/25/2017, 10:05 AM   The patient was seen, examined and discussed with Daune Perch, NP-C and I agree with the above.   66 y.o.femalewith a past medical history significant forhypertension and hypercholesterolemia and a strong family history of premature CAD and SCD admitted with NSTEMI, max troponin 42-->17, cath showed total very proximal occlusion of the ramus intermediate vessel. Normal LAD, left circumflex, and large dominant RCA. Mild LV dysfunction with an EF of 50% and mild mid anterolateral hypocontractility. S/P Successful  PCI/ RI vessel with ultimate insertion of a 2.526 mm Resolute Onyx DES stent postdilated to 2.71 mm with the 100% occlusion being reduced to 0% and resumption of brisk TIMI-3 flow. Continue DAPT a minimum of 1 year, on ASA and Brilinta, ARB, not on BB as she was bradycardic, we will recheck at the clinic follow up. On rosuvastatin, fenofibrate and fish oil. Radial insertion site looks good  with good distal pulse, she is chest pain free while walking, just feels tired. Discharge home today, follow up with Dr Sallyanne Kuster within 1 week.  Ena Dawley, MD 02/25/2017

## 2017-02-25 NOTE — Progress Notes (Signed)
Pt and husband given all discharge instructions and questions were answered and pt verbalized understanding. Pt is aware of prescriptions that were called in to her pharmacy at [facility] on  [address]  and has hard prescription for brilinta. Pt was discharged home via wheelchair with husband and was chest pain free at that time. All belongings with pt.

## 2017-02-27 ENCOUNTER — Telehealth (HOSPITAL_COMMUNITY): Payer: Self-pay | Admitting: Internal Medicine

## 2017-02-27 ENCOUNTER — Encounter (HOSPITAL_COMMUNITY): Payer: Self-pay | Admitting: Cardiovascular Disease

## 2017-02-27 NOTE — Telephone Encounter (Signed)
Verified BCBS insurance benefits through [employer]. No copay, Coinsurance 30%, Deductible $800, pt has met $4.70, responsibility $795.30. Out of Pocket $2450.00, pt has met $226.52, pt's responsibility [phone]. Reference [id number].Marland Kitchen... KJ

## 2017-02-28 ENCOUNTER — Telehealth: Payer: Self-pay | Admitting: Cardiovascular Disease

## 2017-02-28 NOTE — Telephone Encounter (Signed)
New message      Pt was discharged from hospital last sat.  She is calling to find out when she can drive.  She has an appt scheduled end of April but could not wait until then to ask.  Also, are there any discharge instructions for her?  Please call

## 2017-02-28 NOTE — Telephone Encounter (Signed)
Patient contacted regarding discharge from [facility] on 02/23/2017 - 02/25/2017 (2 days).  Patient understands to follow up with provider [name] Deforest, PA-C on 44-18 at 0830am at [facility] office. Patient understands discharge instructions? YES Patient understands medications and regiment? YES Patient understands to bring all medications to this visit? YES, or to bring up-to-date medication list to review at appt.  Pt states that she was told to not drive until her scheduled appt, but appt is not until 03-31-17. Pt verified with pt that she had R radial stick for L heart cath and stent placement (as per TK procedure note-verified) ok to drive to appt scheduled 03-08-17@830  am.

## 2017-03-08 ENCOUNTER — Encounter: Payer: Self-pay | Admitting: Physician Assistant

## 2017-03-08 ENCOUNTER — Ambulatory Visit (INDEPENDENT_AMBULATORY_CARE_PROVIDER_SITE_OTHER): Payer: BLUE CROSS/BLUE SHIELD | Admitting: Physician Assistant

## 2017-03-08 VITALS — BP 116/70 | HR 79 | Ht 64.0 in | Wt 194.0 lb

## 2017-03-08 DIAGNOSIS — I10 Essential (primary) hypertension: Secondary | ICD-10-CM

## 2017-03-08 DIAGNOSIS — I2510 Atherosclerotic heart disease of native coronary artery without angina pectoris: Secondary | ICD-10-CM | POA: Diagnosis not present

## 2017-03-08 DIAGNOSIS — I214 Non-ST elevation (NSTEMI) myocardial infarction: Secondary | ICD-10-CM | POA: Diagnosis not present

## 2017-03-08 DIAGNOSIS — E781 Pure hyperglyceridemia: Secondary | ICD-10-CM

## 2017-03-08 MED ORDER — METOPROLOL TARTRATE 25 MG PO TABS: 12.5000 mg | ORAL_TABLET | Freq: Two times a day (BID) | ORAL | 3 refills | Status: DC

## 2017-03-08 MED ORDER — IRBESARTAN-HYDROCHLOROTHIAZIDE 150-12.5 MG PO TABS: 1.0000 | ORAL_TABLET | Freq: Every day | ORAL | 3 refills | Status: DC

## 2017-03-08 NOTE — Patient Instructions (Signed)
Medication Instructions:  START METOPROLOL TARTRATE 12.5 MG TWICE DAILY DECREASE IRBESARTAN-HCTZ 150-12.5MG  DAILY  If you need a refill on your cardiac medications before your next appointment, please call your pharmacy.  Special Instructions: REFER TO LIPID CLINIC    Thank you for choosing [facility] [place] [facility]!!    HAO MENG, PA-C [place], LPN

## 2017-03-08 NOTE — Progress Notes (Signed)
Thanks, Hao MCr 
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
## 2017-03-10 ENCOUNTER — Telehealth: Payer: Self-pay | Admitting: Cardiovascular Disease

## 2017-03-10 NOTE — Telephone Encounter (Signed)
NOTES FAXED TO NL °

## 2017-03-14 ENCOUNTER — Encounter: Payer: Self-pay | Admitting: Pharmacist Clinician (PhC)/ Clinical Pharmacy Specialist

## 2017-03-14 ENCOUNTER — Ambulatory Visit (INDEPENDENT_AMBULATORY_CARE_PROVIDER_SITE_OTHER): Payer: BLUE CROSS/BLUE SHIELD | Admitting: Pharmacist Clinician (PhC)/ Clinical Pharmacy Specialist

## 2017-03-14 ENCOUNTER — Telehealth: Payer: Self-pay | Admitting: Cardiovascular Disease

## 2017-03-14 DIAGNOSIS — E782 Mixed hyperlipidemia: Secondary | ICD-10-CM | POA: Diagnosis not present

## 2017-03-14 MED ORDER — ICOSAPENT ETHYL 1 G PO CAPS: 2.0000 g | ORAL_CAPSULE | Freq: Two times a day (BID) | ORAL | 6 refills | Status: DC

## 2017-03-14 NOTE — Telephone Encounter (Signed)
Received records from [facility] for appointment on 03/30/17 with Dr Sallyanne Kuster.  Records put with Dr Croitoru's schedule for 03/30/17. lp
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
## 2017-03-14 NOTE — Patient Instructions (Addendum)
Start Vascepa (fish oil) 2 capsules twice daily Repeat labs in 10 weeks   Food Choices to Lower Your Triglycerides Triglycerides are a type of fat in your blood. High levels of triglycerides can increase the risk of heart disease and stroke. If your triglyceride levels are high, the foods you eat and your eating habits are very important. Choosing the right foods can help lower your triglycerides. What general guidelines do I need to follow?  Lose weight if you are overweight.  Limit or avoid alcohol.  Fill one half of your plate with vegetables and green salads.  Limit fruit to two servings a day. Choose fruit instead of juice.  Make one fourth of your plate whole grains. Look for the word "whole" as the first word in the ingredient list.  Fill one fourth of your plate with lean protein foods.  Enjoy fatty fish (such as salmon, mackerel, sardines, and tuna) three times a week.  Choose healthy fats.  Limit foods high in starch and sugar.  Eat more home-cooked food and less restaurant, buffet, and fast food.  Limit fried foods.  Cook foods using methods other than frying.  Limit saturated fats.  Check ingredient lists to avoid foods with partially hydrogenated oils (trans fats) in them. What foods can I eat? Grains  Whole grains, such as whole wheat or whole grain breads, crackers, cereals, and pasta. Unsweetened oatmeal, bulgur, barley, quinoa, or brown rice. Corn or whole wheat flour tortillas. Vegetables  Fresh or frozen vegetables (raw, steamed, roasted, or grilled). Green salads. Fruits  All fresh, canned (in natural juice), or frozen fruits. Meat and Other Protein Products  Ground beef (85% or leaner), grass-fed beef, or beef trimmed of fat. Skinless chicken or [place]. Ground chicken or [place]. Pork trimmed of fat. All fish and seafood. Eggs. Dried beans, peas, or lentils. Unsalted nuts or seeds. Unsalted canned or dry beans. Dairy  Low-fat dairy products, such as  skim or 1% milk, 2% or reduced-fat cheeses, low-fat ricotta or cottage cheese, or plain low-fat yogurt. Fats and Oils  Tub margarines without trans fats. Light or reduced-fat mayonnaise and salad dressings. Avocado. Safflower, olive, or canola oils. Natural peanut or almond butter. The items listed above may not be a complete list of recommended foods or beverages. Contact your dietitian for more options.  What foods are not recommended? Grains  White bread. White pasta. White rice. Cornbread. Bagels, pastries, and croissants. Crackers that contain trans fat. Vegetables  White potatoes. Corn. Creamed or fried vegetables. Vegetables in a cheese sauce. Fruits  Dried fruits. Canned fruit in light or heavy syrup. Fruit juice. Meat and Other Protein Products  Fatty cuts of meat. Ribs, chicken wings, bacon, sausage, bologna, salami, chitterlings, fatback, hot dogs, bratwurst, and packaged luncheon meats. Dairy  Whole or 2% milk, cream, half-and-half, and cream cheese. Whole-fat or sweetened yogurt. Full-fat cheeses. Nondairy creamers and whipped toppings. Processed cheese, cheese spreads, or cheese curds. Sweets and Desserts  Corn syrup, sugars, honey, and molasses. Candy. Jam and jelly. Syrup. Sweetened cereals. Cookies, pies, cakes, donuts, muffins, and ice cream. Fats and Oils  Butter, stick margarine, lard, shortening, ghee, or bacon fat. Coconut, palm kernel, or palm oils. Beverages  Alcohol. Sweetened drinks (such as sodas, lemonade, and fruit drinks or punches). The items listed above may not be a complete list of foods and beverages to avoid. Contact your dietitian for more information.  This information is not intended to replace advice given to you by your health care  provider. Make sure you discuss any questions you have with your health care provider. Document Released: 09/08/2004 Document Revised: 04/28/2016 Document Reviewed: 09/25/2013 Elsevier Interactive Patient Education  2017  [employer].

## 2017-03-14 NOTE — Assessment & Plan Note (Signed)
Patient with hyperlipidemia, LDL at goal, but recent NSTEMI.  Of note patient was on daily meloxicam at time of event.  Has since been discontinued. Will start by switching her OTC fish oil supplement to Vascepa 2 gm bid.  Will repeat labs in 10-12 weeks, then we can consider adding ezetimibe 10 mg daily to further lower LDL.  LDL was 66 at time of event, on rosuvastatin 20 mg daily.  Because she has had problems with myalgias on other statin drugs, I would hesitate to increase this to 40 mg.  She was given information on healthy eating tips for lowering triglycerides.  We could consider one of the research trials for elevated triglycerides, unfortunately one is for patients with DM (she is not), the other for triglycerides > 500.

## 2017-03-15 ENCOUNTER — Telehealth (HOSPITAL_COMMUNITY): Payer: Self-pay | Admitting: Internal Medicine

## 2017-03-15 NOTE — Telephone Encounter (Signed)
I called and spoke with patient about scheduling and participating in the Cardiac Rehab program. Patient was offered an appointment and declined services. Patient stated she is going to decline scheduling and participating Cardiac Rehab. Referral canceled.

## 2017-03-21 ENCOUNTER — Telehealth: Payer: Self-pay | Admitting: Pharmacist Clinician (PhC)/ Clinical Pharmacy Specialist

## 2017-03-21 MED ORDER — OMEGA-3-ACID ETHYL ESTERS 1 G PO CAPS: 2.0000 g | ORAL_CAPSULE | Freq: Two times a day (BID) | ORAL | 6 refills | Status: DC

## 2017-03-21 NOTE — Telephone Encounter (Signed)
Vascepa not covered by patient insurance.  Will try for lovaza 2 gm bid. Patient to call if she has problems getting medication

## 2017-03-22 ENCOUNTER — Telehealth: Payer: Self-pay | Admitting: Physician Assistant

## 2017-03-22 NOTE — Telephone Encounter (Signed)
Called to followup on this patient, I started her on metoprolol after the hospitalization for NSTEMI. She was not started on it in the hospital because her HR was slow. Her HR was 79 when she saw me. Since starting BB, she is feeling fine, no dizziness. Will continue on current medication.  Hilbert Corrigan PA Pager: [phone]

## 2017-03-27 ENCOUNTER — Other Ambulatory Visit: Payer: Self-pay | Admitting: Physician Assistant

## 2017-03-30 ENCOUNTER — Ambulatory Visit (INDEPENDENT_AMBULATORY_CARE_PROVIDER_SITE_OTHER): Payer: BLUE CROSS/BLUE SHIELD | Admitting: Cardiovascular Disease

## 2017-03-30 VITALS — BP 136/72 | HR 55 | Ht 64.0 in | Wt 196.0 lb

## 2017-03-30 DIAGNOSIS — I519 Heart disease, unspecified: Secondary | ICD-10-CM

## 2017-03-30 DIAGNOSIS — N200 Calculus of kidney: Secondary | ICD-10-CM | POA: Diagnosis not present

## 2017-03-30 DIAGNOSIS — E785 Hyperlipidemia, unspecified: Secondary | ICD-10-CM | POA: Diagnosis not present

## 2017-03-30 DIAGNOSIS — Z79899 Other long term (current) drug therapy: Secondary | ICD-10-CM | POA: Diagnosis not present

## 2017-03-30 DIAGNOSIS — I214 Non-ST elevation (NSTEMI) myocardial infarction: Secondary | ICD-10-CM

## 2017-03-30 DIAGNOSIS — I10 Essential (primary) hypertension: Secondary | ICD-10-CM | POA: Diagnosis not present

## 2017-03-30 DIAGNOSIS — E669 Obesity, unspecified: Secondary | ICD-10-CM

## 2017-03-30 MED ORDER — EZETIMIBE 10 MG PO TABS: 10.0000 mg | ORAL_TABLET | Freq: Every day | ORAL | 3 refills | Status: DC
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
## 2017-03-30 NOTE — Patient Instructions (Signed)
Medication Instructions: Dr Sallyanne Kuster has recommended making the following medication changes: 1. START Zetia 10 mg - take 1 tablet by mouth daily  Labwork: Your physician recommends that you return for lab work in 3 months - FASTING.  Testing/Procedures: 1. Echocardiogram - Your physician has requested that you have an echocardiogram. Echocardiography is a painless test that uses sound waves to create images of your heart. It provides your doctor with information about the size and shape of your heart and how well your heart's chambers and valves are working. This procedure takes approximately one hour. There are no restrictions for this procedure. This will be performed at our [facility] location - [address].  Follow-up: Dr Sallyanne Kuster recommends that you schedule a follow-up appointment in SEPTEMBER 2018.  If you need a refill on your cardiac medications before your next appointment, please call your pharmacy.

## 2017-03-31 ENCOUNTER — Encounter: Payer: Self-pay | Admitting: Cardiovascular Disease

## 2017-03-31 DIAGNOSIS — E669 Obesity, unspecified: Secondary | ICD-10-CM | POA: Insufficient documentation

## 2017-03-31 DIAGNOSIS — I519 Heart disease, unspecified: Secondary | ICD-10-CM | POA: Insufficient documentation

## 2017-03-31 DIAGNOSIS — N200 Calculus of kidney: Secondary | ICD-10-CM | POA: Insufficient documentation

## 2017-03-31 DIAGNOSIS — I10 Essential (primary) hypertension: Secondary | ICD-10-CM | POA: Insufficient documentation

## 2017-04-17 ENCOUNTER — Other Ambulatory Visit: Payer: Self-pay

## 2017-04-17 ENCOUNTER — Ambulatory Visit (HOSPITAL_COMMUNITY): Payer: BLUE CROSS/BLUE SHIELD | Attending: Cardiology

## 2017-04-17 DIAGNOSIS — I361 Nonrheumatic tricuspid (valve) insufficiency: Secondary | ICD-10-CM | POA: Diagnosis not present

## 2017-04-17 DIAGNOSIS — I340 Nonrheumatic mitral (valve) insufficiency: Secondary | ICD-10-CM | POA: Insufficient documentation

## 2017-04-17 DIAGNOSIS — I214 Non-ST elevation (NSTEMI) myocardial infarction: Secondary | ICD-10-CM

## 2017-04-17 DIAGNOSIS — I351 Nonrheumatic aortic (valve) insufficiency: Secondary | ICD-10-CM | POA: Diagnosis not present

## 2017-04-21 ENCOUNTER — Telehealth: Payer: Self-pay

## 2017-04-21 NOTE — Telephone Encounter (Signed)
lmtcb

## 2017-04-21 NOTE — Telephone Encounter (Signed)
-----   Message from Sanda Klein, MD sent at 04/17/2017  3:42 PM EDT ----- Normal echo. The areas of weak contraction after her heart attack have resolved completely.

## 2017-04-25 ENCOUNTER — Other Ambulatory Visit: Payer: Self-pay | Admitting: Physician Assistant

## 2017-04-25 NOTE — Telephone Encounter (Signed)
Rx request sent to pharmacy.  

## 2017-05-15 NOTE — Telephone Encounter (Signed)
F/u message  Pt returning RN call. Please call back to discuss echo results.

## 2017-05-16 NOTE — Telephone Encounter (Signed)
Left message of results for pt  

## 2017-06-29 ENCOUNTER — Other Ambulatory Visit: Payer: Self-pay | Admitting: Physician Assistant

## 2017-06-29 NOTE — Telephone Encounter (Signed)
Please review for refill, Thanks !  

## 2017-08-16 ENCOUNTER — Other Ambulatory Visit: Payer: Self-pay | Admitting: Physician Assistant

## 2017-08-21 ENCOUNTER — Other Ambulatory Visit: Payer: Self-pay | Admitting: Cardiovascular Disease

## 2017-09-27 ENCOUNTER — Other Ambulatory Visit: Payer: Self-pay | Admitting: Urology

## 2017-09-27 ENCOUNTER — Telehealth: Payer: Self-pay

## 2017-09-27 NOTE — Telephone Encounter (Signed)
   [facility] Pre-operative Risk Assessment    Request for surgical clearance:  1. What type of surgery is being performed? Extracorporeal shockwave lithotrispy, left   2. When is this surgery scheduled? 10/05/17  3. Are there any medications that need to be held prior to surgery and how long? Brilinta 90 mg BID and ASA 81 mg QD for 5 days prior    4. Practice name and name of physician performing surgery? [facility], Dr Kathie Rhodes   5. What is your office phone and fax number? Phone: [phone], Fax: [phone]   6. Anesthesia type (None, local, MAC, general) ? Not listed   [name], [name] 09/27/2017, 5:18 PM  _________________________________________________________________

## 2017-09-29 NOTE — Telephone Encounter (Signed)
Patient had NSTEMI in 02/2017 and underwent DES to ramus intermedius. He will need at least 12 month of dual antiplatelet given the new fresh stent. He would be high risk for acute instent restenosis of come of both aspirin and brilinta. If patient absolutely have to have surgery, it can only be cleared by the primary cardiologist. Will forward to Dr. Sallyanne Kuster for review.

## 2017-09-29 NOTE — Telephone Encounter (Signed)
Follow up     [name] [name] from [facility] calling for follow up

## 2017-10-02 ENCOUNTER — Encounter (HOSPITAL_COMMUNITY): Payer: Self-pay | Admitting: *Deleted

## 2017-10-02 NOTE — Telephone Encounter (Signed)
Faxed to Cori's attention at [facility], Dr Elta Guadeloupe Ottelin's office.

## 2017-10-02 NOTE — Telephone Encounter (Signed)
[name] [name] from [facility] is calling to follow up on this. She is asking for a call back today.

## 2017-10-02 NOTE — Telephone Encounter (Signed)
After  6 months from stent placement it is OK to temporarily stop Brilinta. Stop 5 days before the lithotripsy and start back right after the procedure if there is no hematuria. MCr

## 2017-10-05 ENCOUNTER — Ambulatory Visit (HOSPITAL_COMMUNITY): Payer: BLUE CROSS/BLUE SHIELD

## 2017-10-05 ENCOUNTER — Ambulatory Visit (HOSPITAL_COMMUNITY)
Admission: RE | Admit: 2017-10-05 | Discharge: 2017-10-05 | Disposition: A | Discharge status: Home / Self Care | Payer: BLUE CROSS/BLUE SHIELD | Source: Ambulatory Visit | Attending: Urology | Admitting: Urology

## 2017-10-05 ENCOUNTER — Encounter (HOSPITAL_COMMUNITY)
Admission: RE | Disposition: A | Discharge status: Home / Self Care | Payer: Self-pay | Source: Ambulatory Visit | Attending: Urology

## 2017-10-05 ENCOUNTER — Encounter (HOSPITAL_COMMUNITY): Payer: Self-pay | Admitting: General Practice

## 2017-10-05 DIAGNOSIS — Z79899 Other long term (current) drug therapy: Secondary | ICD-10-CM | POA: Diagnosis not present

## 2017-10-05 DIAGNOSIS — E7800 Pure hypercholesterolemia, unspecified: Secondary | ICD-10-CM | POA: Diagnosis not present

## 2017-10-05 DIAGNOSIS — Z7982 Long term (current) use of aspirin: Secondary | ICD-10-CM | POA: Insufficient documentation

## 2017-10-05 DIAGNOSIS — N202 Calculus of kidney with calculus of ureter: Secondary | ICD-10-CM | POA: Diagnosis not present

## 2017-10-05 DIAGNOSIS — I10 Essential (primary) hypertension: Secondary | ICD-10-CM | POA: Insufficient documentation

## 2017-10-05 DIAGNOSIS — N200 Calculus of kidney: Secondary | ICD-10-CM

## 2017-10-05 HISTORY — PX: EXTRACORPOREAL SHOCK WAVE LITHOTRIPSY: SHX1557

## 2017-10-05 SURGERY — LITHOTRIPSY, ESWL
Anesthesia: LOCAL | Laterality: Left

## 2017-10-05 MED ORDER — DIPHENHYDRAMINE HCL 25 MG PO CAPS
25.0000 mg | ORAL_CAPSULE | ORAL | Status: AC
  Administered 2017-10-05: 25 mg via ORAL
  Filled 2017-10-05: qty 1

## 2017-10-05 MED ORDER — CIPROFLOXACIN HCL 500 MG PO TABS
500.0000 mg | ORAL_TABLET | ORAL | Status: AC
  Administered 2017-10-05: 500 mg via ORAL
  Filled 2017-10-05: qty 1

## 2017-10-05 MED ORDER — OXYCODONE HCL 10 MG PO TABS: 10.0000 mg | ORAL_TABLET | ORAL | 0 refills | Status: DC | PRN

## 2017-10-05 MED ORDER — SODIUM CHLORIDE 0.9 % IV SOLN
INTRAVENOUS | Status: DC
  Administered 2017-10-05: 08:00:00 via INTRAVENOUS

## 2017-10-05 MED ORDER — TAMSULOSIN HCL 0.4 MG PO CAPS: 0.4000 mg | ORAL_CAPSULE | ORAL | 0 refills | Status: DC

## 2017-10-05 MED ORDER — DIAZEPAM 5 MG PO TABS
10.0000 mg | ORAL_TABLET | ORAL | Status: AC
  Administered 2017-10-05: 10 mg via ORAL
  Filled 2017-10-05: qty 2

## 2017-10-05 NOTE — H&P (Signed)
HPI: [name] [name] is a 66 year-old female with a left UPJ stone.  The problem is on both sides. Her symptoms include flank pain. Patient denies having back pain, groin pain, nausea, vomiting, fever, chills, and blood in urine. This is not her first kidney stone. She is not currently having flank pain, back pain, groin pain, nausea, vomiting, fever or chills.   She has had ESWL and Ureteroscopy for treatment of her stones in the past. This condition would be considered of mild to moderate severity with no modifying factors or associated signs or symptoms other than as noted above.   03/21/17: She's been doing well since her last. She has not developed any flank pain, hematuria or passed any stones. She is asymptomatic today. She has no voiding symptoms. She reports that she has been placed on antihypertensive that contains hydrochlorothiazide and has been taking that for the past 6 months.   09/25/17: She tells me that she continues to have intermittent pain. It's more on the left but occasionally will have some pain on the right as well. She has not had any hematuria. She is currently on magnesium supplementation and also is taking HCTZ. She also has increased her fluid intake.     ALLERGIES: Altace CAPS Benicar TABS Sulfa Drugs tamsulosin    MEDICATIONS: Metoprolol Succinate  Aspirin Ec 81 mg tablet, delayed release Oral  Azelastine Hcl 205.5 mcg (0.15 %) aerosol, spray with pump Nasal  Brilinta  Crestor 20 mg tablet Oral  Cymbalta 30 mg capsule,delayed release Oral  Estradiol 2 mg tablet Oral  Fenofibrate 160 mg tablet Oral  Fish Oil 1000 MG Oral Capsule Oral  Irbesartan  Multi For Her 18 mg iron-600 mcg-40 mcg capsule Oral  Pantoprazole Sodium  Tramadol Hcl 50 mg tablet Oral  Xyzal     GU PSH: ESWL - 2015 Hysterectomy Unilat SO - 2007      PSH Notes: Lithotripsy, Wrist Carpectomy, Decompression Of Median Nerve At Carpal Tunnel, Hysterectomy   NON-GU PSH: Carpal Tunnel  Surgery.. - 2007 Remove Wrist Bone - 2015    GU PMH: Renal calculus (Worsening), Bilateral, She has bilateral renal calculi. The 2 stones in the lower pole of her left kidney are unchanged but she has developed a new 13 mm stone within the left kidney and a slightly smaller stone in the right kidney. They appear to be calcium phosphate. She has increased her fluid intake. Over the past 6 months she is also begun HCTZ. Since she is asymptomatic we are going to follow these but if she has increased in size or become symptomatic she would likely respond to ESWL. I'm going to have her back in 6 months for a KUB to assure stability. - 03/21/2017, Kidney stone on left side, - 2016      PMH Notes: Left nephrolithiasis: She was initially diagnosed with 2 nonobstructing left lower pole calculi in 1/06. She passed a small stone spontaneously in 4/08.  Left ureteroscopy and stone extraction 5/08.  CT scan 3/15 - 3 left renal stones (5, 6, and 92mm in size) and a 51mm right UPJ stone  Left UPJ ESWL 4/15  Stone analysis: Calcium oxalate  Metabolic stone evaluation: Her serum studies revealed no abnormality. Her 24-hour urine showed that her primary risk factor was low urine output of 560 cc/24 hours. She did also have a low urinary magnesium level as well.  Treatment: Increased fluid intake and magnesium supplementation.   Gross hematuria: CT/cysto 3/15 - neg. except for  the renal calculi.     NON-GU PMH: Encounter for general adult medical examination without abnormal findings, Encounter for preventive health examination - 2016 Personal history of other diseases of the circulatory system, History of hypertension - 2014 Personal history of other endocrine, nutritional and metabolic disease, History of hypercholesterolemia - 2014    FAMILY HISTORY: cardiac disorder - Runs In Family Deceased - Runs In Family Heart Disease - Father   SOCIAL HISTORY: Marital Status: Married Ethnicity: Not Hispanic Or Latino;  Race: White Current Smoking Status: Patient has never smoked.   Tobacco Use Assessment Completed: Used Tobacco in last 30 days? Social Drinker.  Drinks 1 caffeinated drink per day.     Notes: Never smoker, Caffeine use, Alcohol use, Two children, Marital History - Currently Married, Occupation:   REVIEW OF SYSTEMS:    GU Review Female:   Patient reports hard to postpone urination. Patient denies get up at night to urinate, trouble starting your stream, being pregnant, frequent urination, burning /pain with urination, stream starts and stops, have to strain to urinate, and leakage of urine.  Gastrointestinal (Upper):   Patient denies nausea, vomiting, and indigestion/ heartburn.  Gastrointestinal (Lower):   Patient denies diarrhea and constipation.  Constitutional:   Patient denies fever, night sweats, weight loss, and fatigue.  Skin:   Patient denies skin rash/ lesion and itching.  Eyes:   Patient denies blurred vision and double vision.  Ears/ Nose/ Throat:   Patient denies sore throat and sinus problems.  Hematologic/Lymphatic:   Patient reports easy bruising. Patient denies swollen glands.  Cardiovascular:   Patient denies leg swelling and chest pains.  Respiratory:   Patient denies cough and shortness of breath.  Endocrine:   Patient denies excessive thirst.  Musculoskeletal:   Patient reports joint pain. Patient denies back pain.  Neurological:   Patient denies headaches and dizziness.  Psychologic:   Patient denies depression and anxiety.   VITAL SIGNS:     Weight 190 lb / 86.18 kg  Height 64 in / 162.56 cm  BP 149/82 mmHg  Pulse 65 /min  BMI 32.6 kg/m   MULTI-SYSTEM PHYSICAL EXAMINATION:    Constitutional: Well-nourished. No physical deformities. Normally developed. Good grooming.  Neck: Neck symmetrical, not swollen. Normal tracheal position.  Respiratory: No labored breathing, no use of accessory muscles.   Cardiovascular: Normal temperature, normal extremity pulses, no  swelling, no varicosities.  Lymphatic: No enlargement of neck, axillae, groin.  Skin: No paleness, no jaundice, no cyanosis. No lesion, no ulcer, no rash.  Neurologic / Psychiatric: Oriented to time, oriented to place, oriented to person. No depression, no anxiety, no agitation.  Gastrointestinal: No mass, no tenderness, no rigidity, non obese abdomen.  Eyes: Normal conjunctivae. Normal eyelids.  Ears, Nose, Mouth, and Throat: Left ear no scars, no lesions, no masses. Right ear no scars, no lesions, no masses. Nose no scars, no lesions, no masses. Normal hearing. Normal lips.  Musculoskeletal: Normal gait and station of head and neck.    PAST DATA REVIEWED:  Source Of History:  Patient  Lab Test Review:   BUN/Creatinine, Calcium  Records Review:   Previous Patient Records, POC Tool  X-Ray Review: IVP: Reviewed Films. Previous KUB images were reviewed and compared with today's study.   Notes:                     She had a normal creatinine of 0.84 in 3/18 with a normal serum calcium at that time at 9.0.  PROCEDURES:         KUB - [id number]  A single view of the abdomen is obtained.               Urinalysis w/Scope Dipstick Dipstick Cont'd Micro  Color: Yellow Bilirubin: Neg WBC/hpf: 0 - 5/hpf  Appearance: Cloudy Ketones: Neg RBC/hpf: 40 - 60/hpf  Specific Gravity: 1.020 Blood: 3+ Bacteria: Rare (0-9/hpf)  pH: 7.0 Protein: Neg Cystals: NS (Not Seen)  Glucose: Neg Urobilinogen: 0.2 Casts: NS (Not Seen)    Nitrites: Neg Trichomonas: Not Present    Leukocyte Esterase: Trace Mucous: Not Present      Epithelial Cells: 0 - 5/hpf      Yeast: NS (Not Seen)      Sperm: Not Present    ASSESSMENT/PLAN:      ICD-10 Details  1 GU:   Renal calculus - N20.0 Bilateral, Worsening - She has a stone in the left kidney that is in the renal pelvis currently and actually at this time appears to be at the ureteropelvic junction. Because of that and the fact that she continues to have intermittent flank  pain I have recommended we treat this and she has undergone lithotripsy successfully in the past and would like to proceed in that fashion for her left sided stone. The stone in her right kidney has increased in size indicating metabolic stone activity and so in addition to treating her left-sided stone on going to perform another 24-hour urine but she is already on HCTZ and magnesium supplementation with increased fluid intake.              Notes:   We discussed the management of urinary stones. These options include observation, ureteroscopy, shockwave lithotripsy, and PCNL. We discussed which options are relevant to these particular stones. We discussed the natural history of stones as well as the complications of untreated stones and the impact on quality of life without treatment as well as with each of the above listed treatments. We also discussed the efficacy of each treatment in its ability to clear the stone burden. With any of these management options I discussed the signs and symptoms of infection and the need for emergent treatment should these be experienced. For each option we discussed the ability of each procedure to clear the patient of their stone burden.   For observation I described the risks which include but are not limited to silent renal damage, life-threatening infection, need for emergent surgery, failure to pass stone, and pain.   For ureteroscopy I described the risks which include heart attack, stroke, pulmonary embolus, death, bleeding, infection, damage to contiguous structures, positioning injury, ureteral stricture, ureteral avulsion, ureteral injury, need for ureteral stent, inability to perform ureteroscopy, need for an interval procedure, inability to clear stone burden, stent discomfort and pain.   For shockwave lithotripsy I described the risks which include arrhythmia, kidney contusion, kidney hemorrhage, need for transfusion, long-term risk of diabetes or  hypertension, back discomfort, flank ecchymosis, flank abrasion, inability to break up stone, inability to pass stone fragments, Steinstrasse, infection associated with obstructing stones, need for different surgical procedure and possible need for repeat shockwave lithotripsy.

## 2017-10-05 NOTE — Op Note (Signed)
See [facility] OP note scanned into chart. Also because of the size, density, location and other factors that cannot be anticipated I feel this will likely be a staged procedure. This fact supersedes any indication in the scanned [facility] operative note to the contrary.  

## 2017-10-05 NOTE — Discharge Instructions (Signed)
Lithotripsy, Care After °This sheet gives you information about how to care for yourself after your procedure. Your health care provider may also give you more specific instructions. If you have problems or questions, contact your health care provider. °What can I expect after the procedure? °After the procedure, it is common to have: °· Some blood in your urine. This should only last for a few days. °· Soreness in your back, sides, or upper abdomen for a few days. °· Blotches or bruises on your back where the pressure wave entered the skin. °· Pain, discomfort, or nausea when pieces (fragments) of the kidney stone move through the tube that carries urine from the kidney to the bladder (ureter). Stone fragments may pass soon after the procedure, but they may continue to pass for up to 4-8 weeks. °? If you have severe pain or nausea, contact your health care provider. This may be caused by a large stone that was not broken up, and this may mean that you need more treatment. °· Some pain or discomfort during urination. °· Some pain or discomfort in the lower abdomen or (in men) at the base of the penis. ° °Follow these instructions at home: °Medicines °· Take over-the-counter and prescription medicines only as told by your health care provider. °· If you were prescribed an antibiotic medicine, take it as told by your health care provider. Do not stop taking the antibiotic even if you start to feel better. °· Do not drive for 24 hours if you were given a medicine to help you relax (sedative). °· Do not drive or use heavy machinery while taking prescription pain medicine. °Eating and drinking °· Drink enough water and fluids to keep your urine clear or pale yellow. This helps any remaining pieces of the stone to pass. It can also help prevent new stones from forming. °· Eat plenty of fresh fruits and vegetables. °· Follow instructions from your health care provider about eating and drinking restrictions. You may be  instructed: °? To reduce how much salt (sodium) you eat or drink. Check ingredients and nutrition facts on packaged foods and beverages. °? To reduce how much meat you eat. °· Eat the recommended amount of calcium for your age and gender. Ask your health care provider how much calcium you should have. °General instructions °· Get plenty of rest. °· Most people can resume normal activities 1-2 days after the procedure. Ask your health care provider what activities are safe for you. °· If directed, strain all urine through the strainer that was provided by your health care provider. °? Keep all fragments for your health care provider to see. Any stones that are found may be sent to a medical lab for examination. The stone may be as small as a grain of salt. °· Keep all follow-up visits as told by your health care provider. This is important. °Contact a health care provider if: °· You have pain that is severe or does not get better with medicine. °· You have nausea that is severe or does not go away. °· You have blood in your urine longer than your health care provider told you to expect. °· You have more blood in your urine. °· You have pain during urination that does not go away. °· You urinate more frequently than usual and this does not go away. °· You develop a rash or any other possible signs of an allergic reaction. °Get help right away if: °· You have severe pain in   your back, sides, or upper abdomen. °· You have severe pain while urinating. °· Your urine is very dark red. °· You have blood in your stool (feces). °· You cannot pass any urine at all. °· You feel a strong urge to urinate after emptying your bladder. °· You have a fever or chills. °· You develop shortness of breath, difficulty breathing, or chest pain. °· You have severe nausea that leads to persistent vomiting. °· You faint. °Summary °· After this procedure, it is common to have some pain, discomfort, or nausea when pieces (fragments) of the  kidney stone move through the tube that carries urine from the kidney to the bladder (ureter). If this pain or nausea is severe, however, you should contact your health care provider. °· Most people can resume normal activities 1-2 days after the procedure. Ask your health care provider what activities are safe for you. °· Drink enough water and fluids to keep your urine clear or pale yellow. This helps any remaining pieces of the stone to pass, and it can help prevent new stones from forming. °· If directed, strain your urine and keep all fragments for your health care provider to see. Fragments or stones may be as small as a grain of salt. °· Get help right away if you have severe pain in your back, sides, or upper abdomen or have severe pain while urinating. °This information is not intended to replace advice given to you by your health care provider. Make sure you discuss any questions you have with your health care provider. °Document Released: 12/11/2007 Document Revised: 10/12/2016 Document Reviewed: 10/12/2016 °Elsevier Interactive Patient Education © 2017 [employer] ° °

## 2017-10-06 ENCOUNTER — Encounter (HOSPITAL_COMMUNITY): Payer: Self-pay | Admitting: Urology

## 2017-10-10 ENCOUNTER — Other Ambulatory Visit: Payer: Self-pay | Admitting: *Deleted

## 2017-10-10 MED ORDER — PANTOPRAZOLE SODIUM 40 MG PO TBEC: 40.0000 mg | DELAYED_RELEASE_TABLET | Freq: Every day | ORAL | 4 refills | Status: DC

## 2017-10-10 NOTE — Telephone Encounter (Signed)
Rx has been sent to the pharmacy electronically. ° °

## 2017-10-19 ENCOUNTER — Other Ambulatory Visit: Payer: Self-pay | Admitting: *Deleted

## 2017-10-19 MED ORDER — EZETIMIBE 10 MG PO TABS: 10.0000 mg | ORAL_TABLET | Freq: Every day | ORAL | 0 refills | Status: DC

## 2017-11-17 ENCOUNTER — Other Ambulatory Visit: Payer: Self-pay | Admitting: *Deleted

## 2017-11-17 MED ORDER — OMEGA-3-ACID ETHYL ESTERS 1 G PO CAPS: 2.0000 g | ORAL_CAPSULE | Freq: Two times a day (BID) | ORAL | 2 refills | Status: DC

## 2018-01-06 ENCOUNTER — Other Ambulatory Visit: Payer: Self-pay | Admitting: Cardiovascular Disease

## 2018-02-05 DIAGNOSIS — Z124 Encounter for screening for malignant neoplasm of cervix: Secondary | ICD-10-CM | POA: Diagnosis not present

## 2018-02-05 DIAGNOSIS — R6882 Decreased libido: Secondary | ICD-10-CM | POA: Diagnosis not present

## 2018-02-05 DIAGNOSIS — Z6834 Body mass index (BMI) 34.0-34.9, adult: Secondary | ICD-10-CM | POA: Diagnosis not present

## 2018-02-05 DIAGNOSIS — Z1231 Encounter for screening mammogram for malignant neoplasm of breast: Secondary | ICD-10-CM | POA: Diagnosis not present

## 2018-02-05 DIAGNOSIS — Z1272 Encounter for screening for malignant neoplasm of vagina: Secondary | ICD-10-CM | POA: Diagnosis not present

## 2018-02-05 DIAGNOSIS — N390 Urinary tract infection, site not specified: Secondary | ICD-10-CM | POA: Diagnosis not present

## 2018-02-12 ENCOUNTER — Encounter: Payer: Self-pay | Admitting: Cardiovascular Disease

## 2018-02-12 DIAGNOSIS — R310 Gross hematuria: Secondary | ICD-10-CM | POA: Diagnosis not present

## 2018-02-12 DIAGNOSIS — N200 Calculus of kidney: Secondary | ICD-10-CM | POA: Diagnosis not present

## 2018-02-26 ENCOUNTER — Encounter: Payer: Self-pay | Admitting: Cardiovascular Disease

## 2018-02-26 ENCOUNTER — Ambulatory Visit: Payer: PPO | Admitting: Cardiovascular Disease

## 2018-02-26 VITALS — BP 118/68 | HR 54 | Ht 64.0 in | Wt 198.8 lb

## 2018-02-26 DIAGNOSIS — I10 Essential (primary) hypertension: Secondary | ICD-10-CM | POA: Diagnosis not present

## 2018-02-26 DIAGNOSIS — E782 Mixed hyperlipidemia: Secondary | ICD-10-CM | POA: Diagnosis not present

## 2018-02-26 DIAGNOSIS — I2510 Atherosclerotic heart disease of native coronary artery without angina pectoris: Secondary | ICD-10-CM

## 2018-02-26 MED ORDER — METOPROLOL SUCCINATE ER 25 MG PO TB24: 25.0000 mg | ORAL_TABLET | Freq: Every day | ORAL | 3 refills | Status: DC

## 2018-02-26 NOTE — Patient Instructions (Addendum)
Dr Sallyanne Kuster has recommended making the following medication changes: 1. STOP Brilinta 2. STOP Metoprolol TARTRATE 3. START Metoprolol SUCCINATE 25 mg - take 1 tablet daily  OKAY TO HOLD Aspirin for 3 days prior to urology procedure  Your physician recommends that you return for lab work at your earliest [place].  Dr Sallyanne Kuster recommends that you schedule a follow-up appointment in 12 months. You will receive a reminder letter in the mail two months in advance. If you don't receive a letter, please call our office to schedule the follow-up appointment.  If you need a refill on your cardiac medications before your next appointment, please call your pharmacy.
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
## 2018-02-28 ENCOUNTER — Other Ambulatory Visit: Payer: Self-pay | Admitting: Urology

## 2018-03-05 DIAGNOSIS — E782 Mixed hyperlipidemia: Secondary | ICD-10-CM | POA: Diagnosis not present

## 2018-03-05 LAB — LIPID PANEL
Chol/HDL Ratio: 3.4 ratio (ref 0.0–4.4)
Cholesterol, Total: 148 mg/dL (ref 100–199)
HDL: 43 mg/dL (ref >39)
LDL CALC: 64 mg/dL (ref 0–99)
Triglycerides: 204 mg/dL — ABNORMAL HIGH (ref 0–149)
VLDL Cholesterol Cal: 41 mg/dL — ABNORMAL HIGH (ref 5–40)

## 2018-03-09 NOTE — Patient Instructions (Addendum)
Your procedure is scheduled on: Friday, March 16, 2018   Surgery Time:  10:30AM-12:30PM   Report to [facility]  Entrance    Report to admitting at 8:30 AM   Call this number if you have problems the morning of surgery [phone]   Do not eat food or drink liquids :After Midnight.   Do NOT smoke after Midnight   Take these medicines the morning of surgery with A SIP OF WATER: Duloxetine, Ezetimbe, Fenofibrate, Metoprolol, Pantoprazole, Rosuvastatin                               You may not have any metal on your body including hair pins, jewelry, and body piercings             Do not wear make-up, lotions, powders, perfumes/cologne, or deodorant             Do not wear nail polish.  Do not shave  48 hours prior to surgery.                Do not bring valuables to the hospital. [place].   Contacts, dentures or bridgework may not be worn into surgery.    Patients discharged the day of surgery will not be allowed to drive home.   Name and phone number of your driver:  Alvester Chou (husband) [phone]   Special Instructions: Bring a copy of your healthcare power of attorney and living will documents         the day of surgery if you haven't scanned them in before.              Please read over the following fact sheets you were given:  [facility] - Preparing for Surgery Before surgery, you can play an important role.  Because skin is not sterile, your skin needs to be as free of germs as possible.  You can reduce the number of germs on your skin by washing with CHG (chlorahexidine gluconate) soap before surgery.  CHG is an antiseptic cleaner which kills germs and bonds with the skin to continue killing germs even after washing. Please DO NOT use if you have an allergy to CHG or antibacterial soaps.  If your skin becomes reddened/irritated stop using the CHG and inform your nurse when you arrive at Short Stay. Do not  shave (including legs and underarms) for at least 48 hours prior to the first CHG shower.  You may shave your face/neck.  Please follow these instructions carefully:  1.  Shower with CHG Soap the night before surgery and the  morning of surgery.  2.  If you choose to wash your hair, wash your hair first as usual with your normal  shampoo.  3.  After you shampoo, rinse your hair and body thoroughly to remove the shampoo.                             4.  Use CHG as you would any other liquid soap.  You can apply chg directly to the skin and wash.  Gently with a scrungie or clean washcloth.  5.  Apply the CHG Soap to your body ONLY FROM THE NECK DOWN.   Do   not use on face/ open  Wound or open sores. Avoid contact with eyes, ears mouth and   genitals (private parts).                       Wash face,  Genitals (private parts) with your normal soap.             6.  Wash thoroughly, paying special attention to the area where your    surgery  will be performed.  7.  Thoroughly rinse your body with warm water from the neck down.  8.  DO NOT shower/wash with your normal soap after using and rinsing off the CHG Soap.                9.  Pat yourself dry with a clean towel.            10.  Wear clean pajamas.            11.  Place clean sheets on your bed the night of your first shower and do not  sleep with pets. Day of Surgery : Do not apply any lotions/deodorants the morning of surgery.  Please wear clean clothes to the hospital/surgery center.  FAILURE TO FOLLOW THESE INSTRUCTIONS MAY RESULT IN THE CANCELLATION OF YOUR SURGERY  PATIENT SIGNATURE_________________________________  NURSE SIGNATURE__________________________________  ________________________________________________________________________

## 2018-03-09 NOTE — Pre-Procedure Instructions (Signed)
The following are in epic: ECHO 04/17/2017 EKG 02/26/2018 Last office visit 02/27/2018

## 2018-03-10 ENCOUNTER — Other Ambulatory Visit: Payer: Self-pay | Admitting: Physician Assistant

## 2018-03-12 ENCOUNTER — Encounter (HOSPITAL_COMMUNITY): Payer: Self-pay

## 2018-03-12 ENCOUNTER — Other Ambulatory Visit: Payer: Self-pay

## 2018-03-12 ENCOUNTER — Encounter (HOSPITAL_COMMUNITY)
Admission: RE | Admit: 2018-03-12 | Discharge: 2018-03-12 | Disposition: A | Discharge status: Home / Self Care | Payer: PPO | Source: Ambulatory Visit | Attending: Urology | Admitting: Urology

## 2018-03-12 DIAGNOSIS — Z01812 Encounter for preprocedural laboratory examination: Secondary | ICD-10-CM | POA: Insufficient documentation

## 2018-03-12 DIAGNOSIS — Z6835 Body mass index (BMI) 35.0-35.9, adult: Secondary | ICD-10-CM | POA: Diagnosis not present

## 2018-03-12 DIAGNOSIS — N183 Chronic kidney disease, stage 3 (moderate): Secondary | ICD-10-CM | POA: Diagnosis not present

## 2018-03-12 DIAGNOSIS — N200 Calculus of kidney: Secondary | ICD-10-CM | POA: Diagnosis not present

## 2018-03-12 DIAGNOSIS — M5380 Other specified dorsopathies, site unspecified: Secondary | ICD-10-CM | POA: Diagnosis not present

## 2018-03-12 DIAGNOSIS — I213 ST elevation (STEMI) myocardial infarction of unspecified site: Secondary | ICD-10-CM | POA: Diagnosis not present

## 2018-03-12 DIAGNOSIS — E7849 Other hyperlipidemia: Secondary | ICD-10-CM | POA: Diagnosis not present

## 2018-03-12 DIAGNOSIS — I10 Essential (primary) hypertension: Secondary | ICD-10-CM | POA: Diagnosis not present

## 2018-03-12 HISTORY — DX: Dermatitis, unspecified: L30.9

## 2018-03-12 HISTORY — DX: Gastro-esophageal reflux disease without esophagitis: K21.9

## 2018-03-12 LAB — BASIC METABOLIC PANEL
ANION GAP: 9 (ref 5–15)
BUN: 23 mg/dL — ABNORMAL HIGH (ref 6–20)
CALCIUM: 9.5 mg/dL (ref 8.9–10.3)
CO2: 26 mmol/L (ref 22–32)
Chloride: 106 mmol/L (ref 101–111)
Creatinine, Ser: 1.09 mg/dL — ABNORMAL HIGH (ref 0.44–1.00)
GFR calc Af Amer: >60 mL/min (ref >60)
GFR calc non Af Amer: 52 mL/min — ABNORMAL LOW (ref >60)
GLUCOSE: 134 mg/dL — AB (ref 65–99)
Potassium: 3.6 mmol/L (ref 3.5–5.1)
Sodium: 141 mmol/L (ref 135–145)

## 2018-03-12 LAB — CBC
HEMATOCRIT: 41.2 % (ref 36.0–46.0)
HEMOGLOBIN: 13.1 g/dL (ref 12.0–15.0)
MCH: 30.6 pg (ref 26.0–34.0)
MCHC: 31.8 g/dL (ref 30.0–36.0)
MCV: 96.3 fL (ref 78.0–100.0)
Platelets: 258 10*3/uL (ref 150–400)
RBC: 4.28 MIL/uL (ref 3.87–5.11)
RDW: 13.8 % (ref 11.5–15.5)
WBC: 4.5 10*3/uL (ref 4.0–10.5)

## 2018-03-12 NOTE — Telephone Encounter (Signed)
Please review for refill. Thanks!  

## 2018-03-12 NOTE — Pre-Procedure Instructions (Signed)
BMP results 03/12/2018 faxed to Dr. Karsten Ro via epic.

## 2018-03-12 NOTE — Pre-Procedure Instructions (Signed)
Dr. Royetta Car reviewed last office visit note 02/27/2018 from Dr. Sallyanne Kuster accepted as cardiac clearance for procedure March 16, 2018.

## 2018-03-16 ENCOUNTER — Ambulatory Visit (HOSPITAL_COMMUNITY): Payer: PPO | Admitting: Anesthesiology

## 2018-03-16 ENCOUNTER — Ambulatory Visit (HOSPITAL_COMMUNITY): Payer: PPO

## 2018-03-16 ENCOUNTER — Encounter (HOSPITAL_COMMUNITY): Payer: Self-pay

## 2018-03-16 ENCOUNTER — Encounter (HOSPITAL_COMMUNITY)
Admission: RE | Disposition: A | Discharge status: Home / Self Care | Payer: Self-pay | Source: Ambulatory Visit | Attending: Urology

## 2018-03-16 ENCOUNTER — Ambulatory Visit (HOSPITAL_COMMUNITY)
Admission: RE | Admit: 2018-03-16 | Discharge: 2018-03-16 | Disposition: A | Discharge status: Home / Self Care | Payer: PPO | Source: Ambulatory Visit | Attending: Urology | Admitting: Urology

## 2018-03-16 DIAGNOSIS — I10 Essential (primary) hypertension: Secondary | ICD-10-CM | POA: Insufficient documentation

## 2018-03-16 DIAGNOSIS — I2510 Atherosclerotic heart disease of native coronary artery without angina pectoris: Secondary | ICD-10-CM | POA: Diagnosis not present

## 2018-03-16 DIAGNOSIS — Z79899 Other long term (current) drug therapy: Secondary | ICD-10-CM | POA: Insufficient documentation

## 2018-03-16 DIAGNOSIS — N200 Calculus of kidney: Secondary | ICD-10-CM

## 2018-03-16 DIAGNOSIS — Z7982 Long term (current) use of aspirin: Secondary | ICD-10-CM | POA: Insufficient documentation

## 2018-03-16 DIAGNOSIS — Z955 Presence of coronary angioplasty implant and graft: Secondary | ICD-10-CM | POA: Diagnosis not present

## 2018-03-16 DIAGNOSIS — E669 Obesity, unspecified: Secondary | ICD-10-CM | POA: Insufficient documentation

## 2018-03-16 DIAGNOSIS — Z85828 Personal history of other malignant neoplasm of skin: Secondary | ICD-10-CM | POA: Insufficient documentation

## 2018-03-16 DIAGNOSIS — K219 Gastro-esophageal reflux disease without esophagitis: Secondary | ICD-10-CM | POA: Diagnosis not present

## 2018-03-16 DIAGNOSIS — Z7902 Long term (current) use of antithrombotics/antiplatelets: Secondary | ICD-10-CM | POA: Diagnosis not present

## 2018-03-16 DIAGNOSIS — E7800 Pure hypercholesterolemia, unspecified: Secondary | ICD-10-CM | POA: Insufficient documentation

## 2018-03-16 DIAGNOSIS — I252 Old myocardial infarction: Secondary | ICD-10-CM | POA: Insufficient documentation

## 2018-03-16 DIAGNOSIS — Z6835 Body mass index (BMI) 35.0-35.9, adult: Secondary | ICD-10-CM | POA: Diagnosis not present

## 2018-03-16 DIAGNOSIS — E782 Mixed hyperlipidemia: Secondary | ICD-10-CM | POA: Diagnosis not present

## 2018-03-16 HISTORY — PX: CYSTOSCOPY/URETEROSCOPY/HOLMIUM LASER/STENT PLACEMENT: SHX6546

## 2018-03-16 SURGERY — CYSTOSCOPY/URETEROSCOPY/HOLMIUM LASER/STENT PLACEMENT
Anesthesia: General | Laterality: Right

## 2018-03-16 MED ORDER — OXYCODONE HCL 5 MG/5ML PO SOLN
5.0000 mg | Freq: Once | ORAL | Status: DC | PRN
  Filled 2018-03-16: qty 5

## 2018-03-16 MED ORDER — PHENAZOPYRIDINE HCL 200 MG PO TABS: 200.0000 mg | ORAL_TABLET | Freq: Three times a day (TID) | ORAL | 0 refills | Status: DC | PRN

## 2018-03-16 MED ORDER — MIDAZOLAM HCL 5 MG/5ML IJ SOLN
INTRAMUSCULAR | Status: DC | PRN
  Administered 2018-03-16 (×2): 1 mg via INTRAVENOUS

## 2018-03-16 MED ORDER — SODIUM CHLORIDE 0.9 % IR SOLN
Status: DC | PRN
  Administered 2018-03-16: 3000 mL

## 2018-03-16 MED ORDER — HYDROCODONE-ACETAMINOPHEN 5-325 MG PO TABS
ORAL_TABLET | ORAL | Status: AC
  Filled 2018-03-16: qty 1

## 2018-03-16 MED ORDER — FENTANYL CITRATE (PF) 100 MCG/2ML IJ SOLN
INTRAMUSCULAR | Status: AC
  Filled 2018-03-16: qty 2

## 2018-03-16 MED ORDER — HYDROCODONE-ACETAMINOPHEN 10-325 MG PO TABS: 1.0000 | ORAL_TABLET | ORAL | 0 refills | Status: DC | PRN

## 2018-03-16 MED ORDER — ONDANSETRON HCL 4 MG/2ML IJ SOLN: 4.0000 mg | Freq: Once | INTRAMUSCULAR | Status: DC | PRN

## 2018-03-16 MED ORDER — GLYCOPYRROLATE 0.2 MG/ML IJ SOLN
INTRAMUSCULAR | Status: DC | PRN
  Administered 2018-03-16: 0.1 mg via INTRAVENOUS

## 2018-03-16 MED ORDER — FENTANYL CITRATE (PF) 100 MCG/2ML IJ SOLN
INTRAMUSCULAR | Status: DC | PRN
  Administered 2018-03-16: 100 ug via INTRAVENOUS

## 2018-03-16 MED ORDER — HYDROCODONE-ACETAMINOPHEN 10-325 MG PO TABS: 1.0000 | ORAL_TABLET | ORAL | Status: DC | PRN

## 2018-03-16 MED ORDER — ROCURONIUM BROMIDE 100 MG/10ML IV SOLN
INTRAVENOUS | Status: DC | PRN
  Administered 2018-03-16: 50 mg via INTRAVENOUS

## 2018-03-16 MED ORDER — PHENAZOPYRIDINE HCL 200 MG PO TABS: 200.0000 mg | ORAL_TABLET | Freq: Three times a day (TID) | ORAL | Status: DC

## 2018-03-16 MED ORDER — IOHEXOL 300 MG/ML  SOLN
INTRAMUSCULAR | Status: DC | PRN
  Administered 2018-03-16: 10 mL via URETHRAL

## 2018-03-16 MED ORDER — CIPROFLOXACIN IN D5W 400 MG/200ML IV SOLN
INTRAVENOUS | Status: AC
  Filled 2018-03-16: qty 200

## 2018-03-16 MED ORDER — PHENAZOPYRIDINE HCL 200 MG PO TABS
ORAL_TABLET | ORAL | Status: AC
  Administered 2018-03-16: 200 mg
  Filled 2018-03-16: qty 1

## 2018-03-16 MED ORDER — PROPOFOL 10 MG/ML IV BOLUS
INTRAVENOUS | Status: DC | PRN
  Administered 2018-03-16: 150 mg via INTRAVENOUS

## 2018-03-16 MED ORDER — 0.9 % SODIUM CHLORIDE (POUR BTL) OPTIME
TOPICAL | Status: DC | PRN
  Administered 2018-03-16: 1000 mL

## 2018-03-16 MED ORDER — CIPROFLOXACIN IN D5W 400 MG/200ML IV SOLN
400.0000 mg | Freq: Once | INTRAVENOUS | Status: AC
  Administered 2018-03-16: 400 mg via INTRAVENOUS
  Filled 2018-03-16: qty 200

## 2018-03-16 MED ORDER — SUGAMMADEX SODIUM 200 MG/2ML IV SOLN
INTRAVENOUS | Status: DC | PRN
  Administered 2018-03-16: 200 mg via INTRAVENOUS

## 2018-03-16 MED ORDER — LIDOCAINE HCL (CARDIAC) 20 MG/ML IV SOLN
INTRAVENOUS | Status: DC | PRN
  Administered 2018-03-16: 80 mg via INTRAVENOUS

## 2018-03-16 MED ORDER — HYDROCODONE-ACETAMINOPHEN 5-325 MG PO TABS
ORAL_TABLET | ORAL | Status: AC
  Administered 2018-03-16: 2
  Filled 2018-03-16: qty 2

## 2018-03-16 MED ORDER — OXYCODONE HCL 5 MG PO TABS: 5.0000 mg | ORAL_TABLET | Freq: Once | ORAL | Status: DC | PRN

## 2018-03-16 MED ORDER — MIDAZOLAM HCL 2 MG/2ML IJ SOLN
INTRAMUSCULAR | Status: AC
  Filled 2018-03-16: qty 2

## 2018-03-16 MED ORDER — FENTANYL CITRATE (PF) 100 MCG/2ML IJ SOLN
25.0000 ug | INTRAMUSCULAR | Status: DC | PRN
  Administered 2018-03-16: 25 ug via INTRAVENOUS
  Administered 2018-03-16: 50 ug via INTRAVENOUS

## 2018-03-16 MED ORDER — ONDANSETRON HCL 4 MG/2ML IJ SOLN
INTRAMUSCULAR | Status: DC | PRN
  Administered 2018-03-16: 4 mg via INTRAVENOUS

## 2018-03-16 MED ORDER — LACTATED RINGERS IV SOLN
INTRAVENOUS | Status: DC
  Administered 2018-03-16: 1000 mL via INTRAVENOUS

## 2018-03-16 MED FILL — HYDROCODON-APAP 10-325: 10-325 | 3 days supply | Qty: 20 | Fill #0

## 2018-03-16 SURGICAL SUPPLY — 25 items
BAG URO CATCHER STRL LF (MISCELLANEOUS) ×2 IMPLANT
BASKET ZERO TIP 1.9FR (BASKET) ×1 IMPLANT
BASKET ZERO TIP NITINOL 2.4FR (BASKET) IMPLANT
BSKT STON RTRVL ZERO TP 1.9FR (BASKET)
BSKT STON RTRVL ZERO TP 2.4FR (BASKET)
CATH INTERMIT  6FR 70CM (CATHETERS) ×2 IMPLANT
CLOTH BEACON ORANGE TIMEOUT ST (SAFETY) ×1 IMPLANT
COVER FOOTSWITCH UNIV (MISCELLANEOUS) ×1 IMPLANT
COVER SURGICAL LIGHT HANDLE (MISCELLANEOUS) ×1 IMPLANT
EXTRACTOR STONE 1.7FRX115CM (UROLOGICAL SUPPLIES) IMPLANT
FIBER LASER TRAC TIP (UROLOGICAL SUPPLIES) ×1 IMPLANT
GLOVE BIOGEL M 8.0 STRL (GLOVE) ×2 IMPLANT
GOWN STRL REUS W/TWL XL LVL3 (GOWN DISPOSABLE) ×2 IMPLANT
GUIDEWIRE ANG ZIPWIRE 038X150 (WIRE) IMPLANT
GUIDEWIRE STR DUAL SENSOR (WIRE) ×2 IMPLANT
IV NS 1000ML (IV SOLUTION)
IV NS 1000ML BAXH (IV SOLUTION) ×1 IMPLANT
MANIFOLD NEPTUNE II (INSTRUMENTS) ×2 IMPLANT
PACK CYSTO (CUSTOM PROCEDURE TRAY) ×2 IMPLANT
SHEATH ACCESS URETERAL 24CM (SHEATH) IMPLANT
SHEATH ACCESS URETERAL 54CM (SHEATH) IMPLANT
SHEATH URETERAL 12FRX35CM (MISCELLANEOUS) ×1 IMPLANT
STENT URET 6FRX24 CONTOUR (STENTS) ×1 IMPLANT
TUBING CONNECTING 10 (TUBING) ×2 IMPLANT
TUBING UROLOGY SET (TUBING) ×2 IMPLANT

## 2018-03-16 NOTE — Anesthesia Procedure Notes (Signed)
Procedure Name: Intubation Date/Time: 03/16/2018 10:59 AM Performed by: Adalberto Ill, CRNA Pre-anesthesia Checklist: Patient identified, Emergency Drugs available, Suction available, Timeout performed and Patient being monitored Patient Re-evaluated:Patient Re-evaluated prior to induction Oxygen Delivery Method: Circle system utilized Preoxygenation: Pre-oxygenation with 100% oxygen Induction Type: IV induction Ventilation: Mask ventilation without difficulty Laryngoscope Size: Miller and 2 Grade View: Grade I Tube type: Oral Tube size: 7.0 mm Number of attempts: 1 Airway Equipment and Method: Stylet Placement Confirmation: ETT inserted through vocal cords under direct vision,  positive ETCO2 and breath sounds checked- equal and bilateral Secured at: 21 cm Tube secured with: Tape Dental Injury: Teeth and Oropharynx as per pre-operative assessment

## 2018-03-16 NOTE — Op Note (Signed)
PATIENT:  [name] [name]  PRE-OPERATIVE DIAGNOSIS:  Right renal calculus  POST-OPERATIVE DIAGNOSIS: Same  PROCEDURE:  1. Cystoscopy with right retrograde pyelogram including interpretation. 2. Right ureteroscopy, laser lithotripsy and stent placement  SURGEON: Claybon Jabs, MD  INDICATION: [name] [name] is a 67 year old female with bilateral, symptomatic renal calculi.  Her largest stone is on the right-hand side and she has been having intermittent gross hematuria.  We did therefore have discussed treating the stone ureteroscopically and then returning to treat the stones on the contralateral side at a later date.  ANESTHESIA:  General  EBL:  Minimal  DRAINS: 6 French, 24 cm double-J stent in the right ureter (with string)  SPECIMEN: None  DESCRIPTION OF PROCEDURE: The patient was taken to the major OR and placed on the table. General anesthesia was administered and then the patient was moved to the dorsal lithotomy position. The genitalia was sterilely prepped and draped. An official timeout was performed.  Initially the 89 French cystoscope with 30 lens was passed under direct vision into the bladder. The bladder was then fully inspected. It was noted be free of any tumors, stones or inflammatory lesions. Ureteral orifices were of normal configuration and position. A 6 French open-ended ureteral catheter was then passed through the cystoscope into the ureteral orifice in order to perform a right retrograde pyelogram.  A retrograde pyelogram was performed by injecting full-strength contrast up the right ureter under direct fluoroscopic control. It revealed a filling defect in the renal pelvis consistent with the stone seen on the preoperative KUB. The remainder of the ureter was noted to be normal as was the intrarenal collecting system. I then passed a 0.038 inch floppy-tipped guidewire through the open ended catheter and into the area of the renal pelvis and this was left in place.   Ureteral access sheath was then passed over the guidewire and up the right ureter with little resistance.  It was positioned below the stone at the level of the UPJ.  I then proceeded with ureteroscopy.  A 6 [place] digital, dual-lumen ureteroscope was then passed through the access sheath and into the renal pelvis where the stone was identified.  The stone was identified and I felt it was too large to extract and therefore elected to proceed with laser lithotripsy. The 200  holmium laser fiber was used at a rate of 50 Hz and an energy level varying from 0.2 up to 2.0 throughout the case in order to completely dust the stone.  I visualized each of the calyces and noted only dusted stone.  Full visualization of the renal pelvis and calyces revealed no injury or bleeding. I therefore removed the ureteroscope and passed the guidewire through the access sheath under fluoroscopy under fluoroscopy noting the guidewire noting the guidewire was positioned was positioned in the area of the renal pelvis.  I then removed the access sheath. in the area the renal pelvis. I then removed the access sheath.    I then backloaded the cystoscope over the guidewire and passed the stent over the guidewire into the area of the renal pelvis. As the guidewire was removed good curl was noted in the renal pelvis. The bladder was drained and the cystoscope was then removed.  I left the string fixed to the distal aspect of the stent and then placed this in the vagina.  The patient was then awakened and taken to the recovery room in stable and satisfactory condition.  She tolerated the procedure well with  no intraoperative complications. She tolerated procedure well no intraoperative complications.  This PLAN OF CARE: Discharge to home after PACU  PATIENT DISPOSITION:  PACU - hemodynamically stable.

## 2018-03-16 NOTE — Discharge Instructions (Signed)

## 2018-03-16 NOTE — Anesthesia Preprocedure Evaluation (Addendum)
Anesthesia Evaluation  Patient identified by MRN, date of birth, ID band Patient awake    Reviewed: Allergy & Precautions, NPO status , Patient's Chart, lab work & pertinent test results, reviewed documented beta blocker date and time   Airway Mallampati: II  TM Distance: >3 FB Neck ROM: Full    Dental  (+) Dental Advisory Given, Teeth Intact   Pulmonary neg pulmonary ROS,    Pulmonary exam normal breath sounds clear to auscultation       Cardiovascular Exercise Tolerance: Good hypertension, Pt. on medications and Pt. on home beta blockers + CAD, + Past MI and + Cardiac Stents  Normal cardiovascular exam Rhythm:Regular Rate:Normal  '18 TTE - EF 60% to 65%. No regional wall motion abnormalities. Trivial AI, mild MR, mild TR.   '18 Cath - Ramus lesion, 60 %stenosed. A STENT RESOLUTE ONYX 2.5X26 drug eluting stent was successfully placed. Ost Ramus lesion, 100 %stenosed. Post intervention, there is a 0% residual stenosis.  Normal LAD, left circumflex, and large dominant RCA. Mild LV dysfunction with an EF of 50% and mild mid anterolateral hypocontractility.    Neuro/Psych  Headaches, negative psych ROS   GI/Hepatic Neg liver ROS, GERD  Controlled and Medicated,  Endo/Other  Obesity  Renal/GU Renal InsufficiencyNephrolithiasis  negative genitourinary   Musculoskeletal  (+) Arthritis , Osteoarthritis,    Abdominal   Peds  Hematology negative hematology ROS (+)   Anesthesia Other Findings Basal cell cancer of nose  Reproductive/Obstetrics                           Anesthesia Physical Anesthesia Plan  ASA: III  Anesthesia Plan: General   Post-op Pain Management:    Induction: Intravenous  PONV Risk Score and Plan: 4 or greater and Treatment may vary due to age or medical condition, Ondansetron and Dexamethasone  Airway Management Planned: LMA  Additional Equipment:  None  Intra-op Plan:   Post-operative Plan: Extubation in OR  Informed Consent: I have reviewed the patients History and Physical, chart, labs and discussed the procedure including the risks, benefits and alternatives for the proposed anesthesia with the patient or authorized representative who has indicated his/her understanding and acceptance.   Dental advisory given  Plan Discussed with: CRNA and Anesthesiologist  Anesthesia Plan Comments:        Anesthesia Quick Evaluation

## 2018-03-16 NOTE — H&P (Signed)
HPI: [name] [name] is a 67 year-old female with bilateral renal calculi who presents today for treatment of her right renal stone.  The patient was last seen 01/08/2018. The patient's stone was on her bilateral side. She did not pass a stone since the last office visit.   The patient has had flank pain since they were last seen. The patient has developed urgency. She has yes seen blood in her urine since the last visit. She is not currently having flank pain, back pain, groin pain, nausea, vomiting, fever or chills. She has had ESWL, Ureteral Stent, and Ureteroscopy for treatment of her stones in the past. This condition would be considered of mild to moderate severity with no modifying factors or associated signs or symptoms other than as noted above.   02/12/18: She has been having some intermittent pain that is not particularly severe and describes it about 50% of the time on the right-hand side and 50% of the time on the left. When she is more active she sees blood in the urine. She is not having any pain today. She has not seen any further stone fragments passed since her left renal pelvic lithotripsy.     ALLERGIES: Altace CAPS Benicar TABS Sulfa Drugs tamsulosin    MEDICATIONS: Metoprolol Tartrate 25 mg tablet  Aspirin Ec 81 mg tablet, delayed release Oral  Azelastine Hcl 205.5 mcg (0.15 %) aerosol, spray with pump Nasal  Brilinta  Crestor 20 mg tablet Oral  Cymbalta 30 mg capsule,delayed release Oral  Ezetimibe 10 mg tablet  Fenofibrate 160 mg tablet Oral  Glucosamine  Irbesartan  Magnesium 400 mg magnesium tablet  Multi For Her 18 mg iron-600 mcg-40 mcg capsule Oral  Multiple Vitamins  Omega 3  Pantoprazole Sodium  Tramadol Hcl 50 mg tablet Oral  Xyzal     GU PSH: ESWL - 10/05/2017, 2015 Hysterectomy Unilat SO - 2007      PSH Notes: Lithotripsy, Wrist Carpectomy, Decompression Of Median Nerve At Carpal Tunnel, Hysterectomy   NON-GU PSH: Carpal Tunnel Surgery.. -  2007 Remove Wrist Bone - 2015    GU PMH: Renal and ureteral calculus - 10/19/2017 Renal calculus (Worsening), Bilateral, She has a stone in the left kidney that is in the renal pelvis currently and actually at this time appears to be at the ureteropelvic junction. Because of that and the fact that she continues to have intermittent flank pain I have recommended we treat this and she has undergone lithotripsy successfully in the past and would like to proceed in that fashion for her left sided stone. The stone in her right kidney has increased in size indicating metabolic stone activity and so in addition to treating her left-sided stone on going to perform another 24-hour urine but she is already on HCTZ and magnesium supplementation with increased fluid intake. - 09/25/2017, (Worsening), Bilateral, She has bilateral renal calculi. The 2 stones in the lower pole of her left kidney are unchanged but she has developed a new 13 mm stone within the left kidney and a slightly smaller stone in the right kidney. They appear to be calcium phosphate. She has increased her fluid intake. Over the past 6 months she is also begun HCTZ. Since she is asymptomatic we are going to follow these but if she has increased in size or become symptomatic she would likely respond to ESWL. I'm going to have her back in 6 months for a KUB to assure stability., - 03/21/2017, Kidney stone on left side, - 09/22/2015  PMH Notes: Left nephrolithiasis: She was initially diagnosed with 2 nonobstructing left lower pole calculi in 1/06. She passed a small stone spontaneously in 4/08.  Left ureteroscopy and stone extraction 5/08.  CT scan 3/15 - 3 left renal stones (5, 6, and 54mm in size) and a 64mm right UPJ stone  Left UPJ ESWL 4/15  Left ESWL - 11/18  Stone analysis: Calcium oxalate  Metabolic stone evaluation: Her serum studies revealed no abnormality. Her 24-hour urine showed that her primary risk factor was low urine output of 560  cc/24 hours. She did also have a low urinary magnesium level as well.  Treatment: Increased fluid intake and magnesium supplementation.   Gross hematuria: CT/cysto 3/15 - neg. except for the renal calculi.     NON-GU PMH: Encounter for general adult medical examination without abnormal findings, Encounter for preventive health examination - 2016 Personal history of other diseases of the circulatory system, History of hypertension - 2014 Personal history of other endocrine, nutritional and metabolic disease, History of hypercholesterolemia - 2014    FAMILY HISTORY: cardiac disorder - Runs In Family Deceased - Runs In Family Heart Disease - Father   SOCIAL HISTORY: Marital Status: Married Ethnicity: Not Hispanic Or Latino; Race: White Current Smoking Status: Patient has never smoked.   Tobacco Use Assessment Completed: Used Tobacco in last 30 days? Social Drinker.  Drinks 1 caffeinated drink per day.     Notes: Never smoker, Caffeine use, Alcohol use, Two children, Marital History - Currently Married, Occupation:   REVIEW OF SYSTEMS:    GU Review Female:   Patient denies frequent urination, hard to postpone urination, burning /pain with urination, get up at night to urinate, leakage of urine, stream starts and stops, trouble starting your stream, have to strain to urinate, and being pregnant.  Gastrointestinal (Upper):   Patient denies nausea, vomiting, and indigestion/ heartburn.  Gastrointestinal (Lower):   Patient denies diarrhea and constipation.  Constitutional:   Patient denies fever, night sweats, weight loss, and fatigue.  Skin:   Patient denies skin rash/ lesion and itching.  Eyes:   Patient denies blurred vision and double vision.  Ears/ Nose/ Throat:   Patient denies sinus problems and sore throat.  Hematologic/Lymphatic:   Patient denies swollen glands and easy bruising.  Cardiovascular:   Patient denies leg swelling and chest pains.  Respiratory:   Patient denies cough  and shortness of breath.  Endocrine:   Patient denies excessive thirst.  Musculoskeletal:   Patient denies back pain and joint pain.  Neurological:   Patient denies headaches and dizziness.  Psychologic:   Patient denies depression and anxiety.   VITAL SIGNS:    Weight 199 lb / 90.26 kg  Height 64 in / 162.56 cm  BP 99/58 mmHg  Pulse 68 /min  Temperature 97.8 F / 36.5 C  BMI 34.2 kg/m   Physical Exam  Constitutional: Well nourished and well developed . No acute distress.   ENT:. The ears and nose are normal in appearance.   Neck: The appearance of the neck is normal and no neck mass is present.   Pulmonary: No respiratory distress and normal respiratory rhythm and effort.   Cardiovascular: Heart rate and rhythm are normal . No peripheral edema.   Abdomen: The abdomen is soft and nontender. No masses are palpated. No CVA tenderness. No hernias are palpable. No hepatosplenomegaly noted.   Lymphatics: The femoral and inguinal nodes are not enlarged or tender.   Skin: Normal skin turgor, no visible  rash and no visible skin lesions.   Neuro/Psych:. Mood and affect are appropriate.    PAST DATA REVIEWED:  Source Of History:  Patient  Records Review:   Previous Patient Records, POC Tool  X-Ray Review: KUB: Reviewed Films. Previous KUB images were reviewed and compared with today's study. C.T. Abdomen/Pelvis: Reviewed Films. Reviewing her CT scan images I measured Hounsfield units of her stone in the right renal pelvis and found them to be 1100.     PROCEDURES:         KUB - [id number]  A single view of the abdomen is obtained.      Independent review of her KUB images today reveals essentially no change in her left renal calculi of which there are 3 and her single right renal calculus.         Urinalysis Dipstick Dipstick Cont'd  Color: Yellow Bilirubin: Neg  Appearance: Clear Ketones: Neg  Specific Gravity: 1.025 Blood: Neg  pH: 6.5 Protein: Neg  Glucose: Neg  Urobilinogen: 0.2    Nitrites: Neg    Leukocyte Esterase: Neg    ASSESSMENT/PLAN:       ICD-10 Details  1 GU:   Renal calculus - Right - We did discuss treating her largest stone 1st since she has equally symptomatic on both sides. While it could be managed percutaneously I have recommended we try ureteroscopic management. We went over that procedure and the fact that she would need to have a stent. She is going to stop her Brilinta 3 days prior to the procedure. Once that has been treated then I will proceed with treatment of her left renal pelvic stone.  2   Gross hematuria - R31.0 Stable - Her gross hematuria is almost certainly secondary to her UPJ calculi. I do not know whether it is from the right or left but I am going to address both of the stones over time.

## 2018-03-16 NOTE — Anesthesia Postprocedure Evaluation (Signed)
Anesthesia Post Note  Patient: [name] [name]  Procedure(s) Performed: CYSTOSCOPY/RETROGRADE/URETEROSCOPY/HOLMIUM LASER/STENT PLACEMENT (Right )     Patient location during evaluation: PACU Anesthesia Type: General Level of consciousness: awake and alert Pain management: pain level controlled Vital Signs Assessment: post-procedure vital signs reviewed and stable Respiratory status: spontaneous breathing, nonlabored ventilation and respiratory function stable Cardiovascular status: blood pressure returned to baseline and stable Postop Assessment: no apparent nausea or vomiting Anesthetic complications: no    Last Vitals:  Vitals:   03/16/18 1245 03/16/18 1300  BP: 118/67 122/65  Pulse: 62 61  Resp: 15 19  Temp:  36.6 C  SpO2: 97% 97%    Last Pain:  Vitals:   03/16/18 1300  TempSrc:   PainSc: [place]

## 2018-03-16 NOTE — Transfer of Care (Signed)
Immediate Anesthesia Transfer of Care Note  Patient: [name] [name]  Procedure(s) Performed: CYSTOSCOPY/RETROGRADE/URETEROSCOPY/HOLMIUM LASER/STENT PLACEMENT (Right )  Patient Location: PACU  Anesthesia Type:General  Level of Consciousness: awake, alert , oriented, drowsy and patient cooperative  Airway & Oxygen Therapy: Patient Spontanous Breathing and Patient connected to face mask oxygen  Post-op Assessment: Report given to RN and Post -op Vital signs reviewed and stable  Post vital signs: Reviewed and stable  Last Vitals:  Vitals Value Taken Time  BP 113/61 03/16/2018 12:17 PM  Temp    Pulse 68 03/16/2018 12:19 PM  Resp 9 03/16/2018 12:19 PM  SpO2 100 % 03/16/2018 12:19 PM  Vitals shown include unvalidated device data.  Last Pain:  Vitals:   03/16/18 0840  TempSrc:   PainSc: 2       Patients Stated Pain Goal: 4 (17/51/02 5852)  Complications: No apparent anesthesia complications

## 2018-03-22 DIAGNOSIS — N200 Calculus of kidney: Secondary | ICD-10-CM | POA: Diagnosis not present

## 2018-04-06 ENCOUNTER — Other Ambulatory Visit: Payer: Self-pay | Admitting: Cardiovascular Disease

## 2018-05-03 ENCOUNTER — Other Ambulatory Visit: Payer: Self-pay | Admitting: Physician Assistant

## 2018-05-12 ENCOUNTER — Other Ambulatory Visit: Payer: Self-pay | Admitting: Cardiovascular Disease

## 2018-05-14 NOTE — Telephone Encounter (Signed)
Rx(s) sent to pharmacy electronically.  

## 2018-05-25 DIAGNOSIS — N200 Calculus of kidney: Secondary | ICD-10-CM | POA: Diagnosis not present

## 2018-05-25 DIAGNOSIS — R310 Gross hematuria: Secondary | ICD-10-CM | POA: Diagnosis not present

## 2018-06-11 DIAGNOSIS — L2089 Other atopic dermatitis: Secondary | ICD-10-CM | POA: Diagnosis not present

## 2018-06-11 DIAGNOSIS — D2239 Melanocytic nevi of other parts of face: Secondary | ICD-10-CM | POA: Diagnosis not present

## 2018-06-11 DIAGNOSIS — L821 Other seborrheic keratosis: Secondary | ICD-10-CM | POA: Diagnosis not present

## 2018-06-11 DIAGNOSIS — Z85828 Personal history of other malignant neoplasm of skin: Secondary | ICD-10-CM | POA: Diagnosis not present

## 2018-06-11 DIAGNOSIS — L918 Other hypertrophic disorders of the skin: Secondary | ICD-10-CM | POA: Diagnosis not present

## 2018-06-11 DIAGNOSIS — D224 Melanocytic nevi of scalp and neck: Secondary | ICD-10-CM | POA: Diagnosis not present

## 2018-06-11 DIAGNOSIS — D2372 Other benign neoplasm of skin of left lower limb, including hip: Secondary | ICD-10-CM | POA: Diagnosis not present

## 2018-06-11 DIAGNOSIS — D1801 Hemangioma of skin and subcutaneous tissue: Secondary | ICD-10-CM | POA: Diagnosis not present

## 2018-06-12 ENCOUNTER — Other Ambulatory Visit: Payer: Self-pay | Admitting: Urology

## 2018-06-29 ENCOUNTER — Other Ambulatory Visit: Payer: Self-pay | Admitting: Cardiovascular Disease

## 2018-06-29 NOTE — Telephone Encounter (Signed)
Rx request sent to pharmacy.  

## 2018-07-09 ENCOUNTER — Encounter (HOSPITAL_BASED_OUTPATIENT_CLINIC_OR_DEPARTMENT_OTHER): Payer: Self-pay

## 2018-07-09 ENCOUNTER — Other Ambulatory Visit: Payer: Self-pay

## 2018-07-09 NOTE — Pre-Procedure Instructions (Signed)
The following are in epic: EKG 02/26/18 ECHO 04/17/17 Cath 02/24/17

## 2018-07-09 NOTE — Progress Notes (Signed)
Spoke with:  [name] [name] NPO:  After Midnight, no gum, candy, or mints   Arrival time:  0530AM Labs:  Istat 8 AM medications:  Duloxetine, Ezetimibe, Femofibrate, Metoprolol, Pantoprazole, Rosuvastatin, Nasal spray and eye drops if needed Pre op orders: yes Ride home:  Alvester Chou (husband) [phone]

## 2018-07-19 NOTE — H&P (Signed)
HPI: [name] [name] is a 67 year-old female with multiple left renal calculi who presents today for ureteroscopic management of the stones.  April 2019: Due to continued intermittent gross hematuria and intermittent right and left flank pain thought to be from volume of stone disease in each kidney, she underwent right ureteroscopy for treatment of the largest calculus last week. She returns today for stent removal. Denies fevers postprocedure but has been having intermittent flank pain and discomfort that has been improving over the last 2 days. Also associated with increased frequency and urgency the patient has been tolerating this without significant issue.   May 25, 2018: Routine follow-up after her stents were removed in clinic at last office visit. Lower urinary tract symptoms from the irritating stents have resolved. She's not had any additional pain or discomfort. No interval stone material passed. Her gross hematuria has resolved. She's had no recent fever or infection treatment. Unfortunately her husband's knee replacement was delayed due to glycemic control issues as her urologist was planning on performing ureteroscopy for her left sided stone burden now that the right-sided stone burden has been treated. She is here today for KUB and renal ultrasound.   The stone was on the right side. She had Stent and Ureteroscopy for treatment of her renal calculi. Patient denies ESWL and PCNL. This procedure was done 03/16/2018. She did not pass a stone since the last office visit. This is not her first kidney stone. She does not have a stent in place.   She is not currently having flank pain, back pain, groin pain, nausea, vomiting, fever or chills.   She does not have dysuria. She does not have urgency. She does not have frequency.     ALLERGIES: Altace CAPS Benicar TABS Sulfa Drugs tamsulosin    MEDICATIONS: Metoprolol Tartrate 25 mg tablet  Aspirin Ec 81 mg tablet, delayed release Oral   Azelastine Hcl 205.5 mcg (0.15 %) aerosol, spray with pump Nasal  Brilinta  Crestor 20 mg tablet Oral  Cymbalta 30 mg capsule,delayed release Oral  Ezetimibe 10 mg tablet  Fenofibrate 160 mg tablet Oral  Glucosamine  Irbesartan  Magnesium 400 mg magnesium tablet  Multi For Her 18 mg iron-600 mcg-40 mcg capsule Oral  Multiple Vitamins  Omega 3  Pantoprazole Sodium  Tramadol Hcl 50 mg tablet Oral  Xyzal     GU PSH: ESWL - 10/05/2017, 2015 Hysterectomy Unilat SO - 2007 Ureteroscopic laser litho, Right - 03/16/2018      PSH Notes: Lithotripsy, Wrist Carpectomy, Decompression Of Median Nerve At Carpal Tunnel, Hysterectomy   NON-GU PSH: Carpal Tunnel Surgery.. - 2007 Remove Wrist Bone - 2015    GU PMH: Renal calculus - 03/22/2018, (Stable), Bilateral, We did discuss treating her largest stone 1st since she has equally symptomatic on both sides. While it could be managed percutaneously I have recommended we try ureteroscopic management. We went over that procedure and the fact that she would need to have a stent. She is going to stop her Brilinta 3 days prior to the procedure. Once that has been treated then I will proceed with treatment of her left renal pelvic stone., - 02/12/2018 (Worsening), Bilateral, She has a stone in the left kidney that is in the renal pelvis currently and actually at this time appears to be at the ureteropelvic junction. Because of that and the fact that she continues to have intermittent flank pain I have recommended we treat this and she has undergone lithotripsy successfully in the past  and would like to proceed in that fashion for her left sided stone. The stone in her right kidney has increased in size indicating metabolic stone activity and so in addition to treating her left-sided stone on going to perform another 24-hour urine but she is already on HCTZ and magnesium supplementation with increased fluid intake., - 09/25/2017 (Worsening), Bilateral, She has  bilateral renal calculi. The 2 stones in the lower pole of her left kidney are unchanged but she has developed a new 13 mm stone within the left kidney and a slightly smaller stone in the right kidney. They appear to be calcium phosphate. She has increased her fluid intake. Over the past 6 months she is also begun HCTZ. Since she is asymptomatic we are going to follow these but if she has increased in size or become symptomatic she would likely respond to ESWL. I'm going to have her back in 6 months for a KUB to assure stability., - 03/21/2017, Kidney stone on left side, - 2016 Gross hematuria (Stable), Her gross hematuria is almost certainly secondary to her UPJ calculi. I do not know whether it is from the right or left but I am going to address both of the stones over time. - 02/12/2018 Renal and ureteral calculus - 10/19/2017      PMH Notes: Left nephrolithiasis: She was initially diagnosed with 2 nonobstructing left lower pole calculi in 1/06. She passed a small stone spontaneously in 4/08.  Left ureteroscopy and stone extraction 5/08.  CT scan 3/15 - 3 left renal stones (5, 6, and 21mm in size) and a 81mm right UPJ stone  Left UPJ ESWL 4/15  Left ESWL - 11/18  Stone analysis: Calcium oxalate  Metabolic stone evaluation: Her serum studies revealed no abnormality. Her 24-hour urine showed that her primary risk factor was low urine output of 560 cc/24 hours. She did also have a low urinary magnesium level as well.  Treatment: Increased fluid intake and magnesium supplementation.   Gross hematuria: CT/cysto 3/15 - neg. except for the renal calculi.     NON-GU PMH: Encounter for general adult medical examination without abnormal findings, Encounter for preventive health examination - 2016 Personal history of other diseases of the circulatory system, History of hypertension - 2014 Personal history of other endocrine, nutritional and metabolic disease, History of hypercholesterolemia - 2014     FAMILY HISTORY: cardiac disorder - Runs In Family Deceased - Runs In Family Heart Disease - Father   SOCIAL HISTORY: Marital Status: Married Ethnicity: Not Hispanic Or Latino; Race: White Current Smoking Status: Patient has never smoked.   Tobacco Use Assessment Completed: Used Tobacco in last 30 days? Social Drinker.  Drinks 1 caffeinated drink per day.     Notes: Never smoker, Caffeine use, Alcohol use, Two children, Marital History - Currently Married, Occupation:   REVIEW OF SYSTEMS:    GU Review Female:   Patient denies frequent urination, burning /pain with urination, have to strain to urinate, get up at night to urinate, trouble starting your stream, stream starts and stops, hard to postpone urination, leakage of urine, and being pregnant.  Gastrointestinal (Upper):   Patient denies nausea, vomiting, and indigestion/ heartburn.  Gastrointestinal (Lower):   Patient denies diarrhea and constipation.  Constitutional:   Patient denies fever, night sweats, weight loss, and fatigue.  Skin:   Patient reports skin rash/ lesion and itching.   Eyes:   Patient denies blurred vision and double vision.  Ears/ Nose/ Throat:   Patient reports sinus  problems. Patient denies sore throat.  Hematologic/Lymphatic:   Patient reports easy bruising. Patient denies swollen glands.  Cardiovascular:   Patient denies leg swelling and chest pains.  Respiratory:   Patient denies cough and shortness of breath.  Endocrine:   Patient denies excessive thirst.  Musculoskeletal:   Patient reports back pain and joint pain.   Neurological:   Patient denies headaches and dizziness.  Psychologic:   Patient denies depression and anxiety.   VITAL SIGNS:    Weight 200 lb / 90.72 kg  Height 64 in / 162.56 cm  BP 112/70 mmHg  Heart Rate 57 /min  Temperature 98.4 F / 36.8 C  BMI 34.3 kg/m   MULTI-SYSTEM PHYSICAL EXAMINATION:    Constitutional: Well-nourished. No physical deformities. Normally developed. Good  grooming.  Respiratory: No labored breathing, no use of accessory muscles. Normal breath sounds.  Cardiovascular: Regular rate and rhythm. No murmur, no gallop. Normal temperature, normal extremity pulses, no swelling, no varicosities.  Skin: No paleness, no jaundice, no cyanosis. No lesion, no ulcer, no rash.  Neurologic / Psychiatric: Oriented to time, oriented to place, oriented to person. No depression, no anxiety, no agitation.  Gastrointestinal: No mass, no tenderness, no rigidity, non obese abdomen. No CVAT, no flank or suprapubic tenderness.  Musculoskeletal: Normal gait and station of head and neck.     PAST DATA REVIEWED:  Source Of History:  Patient  Records Review:   Previous Hospital Records, Previous Patient Records  Urine Test Review:   Urinalysis  X-Ray Review: KUB: Reviewed Films. Discussed With Patient.  Renal Ultrasound: Reviewed Films. Discussed With Patient.     05/25/18  Urinalysis  Urine Appearance Clear   Urine Color Yellow   Urine Glucose Neg   Urine Bilirubin Neg   Urine Ketones Neg   Urine Specific Gravity 1.025   Urine Blood Trace   Urine pH 6.0   Urine Protein Neg   Urine Urobilinogen 0.2   Urine Nitrites Neg   Urine Leukocyte Esterase 1+   Urine WBC/hpf 0 - 5/hpf   Urine RBC/hpf 0 - 2/hpf   Urine Epithelial Cells 0 - 5/hpf   Urine Bacteria NS (Not Seen)   Urine Mucous Not Present   Urine Yeast NS (Not Seen)   Urine Trichomonas Not Present   Urine Cystals NS (Not Seen)   Urine Casts NS (Not Seen)   Urine Sperm Not Present    PROCEDURES:         KUB - 01779  A single view of the abdomen is obtained. No obvious right-sided stone burden on KUB today. 3 Stable appearing renal pelvis calculi grossly unchanged from previous imaging studies measuring between approximately 4 and 10 mm. The anatomical expected tracts of both ureters appeared clear. Bladder is grossly free of obstruction. Bowel gas pattern within normal limits. Degenerative changes noted  to the lumbar spine, stable appearing. Stable appearing pelvic phlebolith.               Renal Ultrasound - [id number]  Right Kidney: Length:10.6 cm Depth: 4.5 cm Cortical Width: 1.1 cm Width: 5.9 cm  Left Kidney: Length:10.6 cm Depth: 5.2 cm Cortical Width: 1.7 cm Width:4.5 cm  Left Kidney/Ureter:  Multiple calcifications noted with the largest measuring 0.9 cm in the mid kidney.   Right Kidney/Ureter:  0.8 cm x 0.6 cm x 0.8 cm cystic area with no internal blood flow noted in the lower pole. ---- Multiple echogenic foci with the largest measuring 0.3 cm ( calcifications vs  vessels).   Bladder:  PVR = not visualized.         Urinalysis w/Scope Dipstick Dipstick Cont'd Micro  Color: Yellow Bilirubin: Neg WBC/hpf: 0 - 5/hpf  Appearance: Clear Ketones: Neg RBC/hpf: 0 - 2/hpf  Specific Gravity: 1.025 Blood: Trace Bacteria: NS (Not Seen)  pH: 6.0 Protein: Neg Cystals: NS (Not Seen)  Glucose: Neg Urobilinogen: 0.2 Casts: NS (Not Seen)    Nitrites: Neg Trichomonas: Not Present    Leukocyte Esterase: 1+ Mucous: Not Present      Epithelial Cells: 0 - 5/hpf      Yeast: NS (Not Seen)      Sperm: Not Present    ASSESSMENT/PLAN: Left renal calculi: She has 3 stones in her left kidney.  The largest is 10 mm and located in the renal pelvis.  There is an 8 mm stone in the lower pole and also a 6 mm stone in another lower pole calyx.  We therefore have discussed proceeding with treatment of the stones with ureteroscopy.  I've gone over the procedure with her in detail including its risks and complications, the alternatives, the probability of success, the need for stent postoperatively as well as the anticipated postoperative course.  She understands and has elected to proceed. She has stopped her Brilinta in preparation for her surgery.

## 2018-07-20 ENCOUNTER — Ambulatory Visit (HOSPITAL_BASED_OUTPATIENT_CLINIC_OR_DEPARTMENT_OTHER): Payer: PPO | Admitting: Anesthesiology

## 2018-07-20 ENCOUNTER — Encounter (HOSPITAL_BASED_OUTPATIENT_CLINIC_OR_DEPARTMENT_OTHER): Payer: Self-pay | Admitting: *Deleted

## 2018-07-20 ENCOUNTER — Ambulatory Visit (HOSPITAL_BASED_OUTPATIENT_CLINIC_OR_DEPARTMENT_OTHER)
Admission: RE | Admit: 2018-07-20 | Discharge: 2018-07-20 | Disposition: A | Discharge status: Home / Self Care | Payer: PPO | Source: Ambulatory Visit | Attending: Urology | Admitting: Urology

## 2018-07-20 ENCOUNTER — Encounter (HOSPITAL_BASED_OUTPATIENT_CLINIC_OR_DEPARTMENT_OTHER)
Admission: RE | Disposition: A | Discharge status: Home / Self Care | Payer: Self-pay | Source: Ambulatory Visit | Attending: Urology

## 2018-07-20 DIAGNOSIS — Z7982 Long term (current) use of aspirin: Secondary | ICD-10-CM | POA: Diagnosis not present

## 2018-07-20 DIAGNOSIS — E7800 Pure hypercholesterolemia, unspecified: Secondary | ICD-10-CM | POA: Insufficient documentation

## 2018-07-20 DIAGNOSIS — I2510 Atherosclerotic heart disease of native coronary artery without angina pectoris: Secondary | ICD-10-CM | POA: Diagnosis not present

## 2018-07-20 DIAGNOSIS — Z7901 Long term (current) use of anticoagulants: Secondary | ICD-10-CM | POA: Insufficient documentation

## 2018-07-20 DIAGNOSIS — N200 Calculus of kidney: Secondary | ICD-10-CM

## 2018-07-20 DIAGNOSIS — K219 Gastro-esophageal reflux disease without esophagitis: Secondary | ICD-10-CM | POA: Insufficient documentation

## 2018-07-20 DIAGNOSIS — Z955 Presence of coronary angioplasty implant and graft: Secondary | ICD-10-CM | POA: Insufficient documentation

## 2018-07-20 DIAGNOSIS — I252 Old myocardial infarction: Secondary | ICD-10-CM | POA: Diagnosis not present

## 2018-07-20 DIAGNOSIS — Z79899 Other long term (current) drug therapy: Secondary | ICD-10-CM | POA: Diagnosis not present

## 2018-07-20 DIAGNOSIS — I10 Essential (primary) hypertension: Secondary | ICD-10-CM | POA: Insufficient documentation

## 2018-07-20 DIAGNOSIS — E782 Mixed hyperlipidemia: Secondary | ICD-10-CM | POA: Diagnosis not present

## 2018-07-20 HISTORY — PX: CYSTOSCOPY/URETEROSCOPY/HOLMIUM LASER/STENT PLACEMENT: SHX6546

## 2018-07-20 LAB — POCT I-STAT, CHEM 8
BUN: 23 mg/dL (ref 8–23)
Calcium, Ion: 1.3 mmol/L (ref 1.15–1.40)
Chloride: 105 mmol/L (ref 98–111)
Creatinine, Ser: 1 mg/dL (ref 0.44–1.00)
Glucose, Bld: 106 mg/dL — ABNORMAL HIGH (ref 70–99)
HCT: 42 % (ref 36.0–46.0)
Hemoglobin: 14.3 g/dL (ref 12.0–15.0)
Potassium: 3.7 mmol/L (ref 3.5–5.1)
Sodium: 144 mmol/L (ref 135–145)
TCO2: 25 mmol/L (ref 22–32)

## 2018-07-20 SURGERY — CYSTOSCOPY/URETEROSCOPY/HOLMIUM LASER/STENT PLACEMENT
Anesthesia: General | Site: Ureter | Laterality: Left

## 2018-07-20 MED ORDER — SODIUM CHLORIDE 0.9 % IR SOLN
Status: DC | PRN
  Administered 2018-07-20: 3000 mL via INTRAVESICAL

## 2018-07-20 MED ORDER — KETOROLAC TROMETHAMINE 30 MG/ML IJ SOLN
INTRAMUSCULAR | Status: DC | PRN
  Administered 2018-07-20: 30 mg via INTRAVENOUS

## 2018-07-20 MED ORDER — PHENAZOPYRIDINE HCL 200 MG PO TABS: 200.0000 mg | ORAL_TABLET | Freq: Three times a day (TID) | ORAL | 0 refills | Status: DC | PRN

## 2018-07-20 MED ORDER — CIPROFLOXACIN IN D5W 400 MG/200ML IV SOLN
INTRAVENOUS | Status: AC
  Filled 2018-07-20: qty 200

## 2018-07-20 MED ORDER — KETOROLAC TROMETHAMINE 30 MG/ML IJ SOLN
INTRAMUSCULAR | Status: AC
  Filled 2018-07-20: qty 1

## 2018-07-20 MED ORDER — FENTANYL CITRATE (PF) 100 MCG/2ML IJ SOLN
INTRAMUSCULAR | Status: AC
  Filled 2018-07-20: qty 2

## 2018-07-20 MED ORDER — ONDANSETRON HCL 4 MG/2ML IJ SOLN
INTRAMUSCULAR | Status: DC | PRN
  Administered 2018-07-20: 4 mg via INTRAVENOUS

## 2018-07-20 MED ORDER — EPHEDRINE SULFATE-NACL 50-0.9 MG/10ML-% IV SOSY
PREFILLED_SYRINGE | INTRAVENOUS | Status: DC | PRN
  Administered 2018-07-20: 5 mg via INTRAVENOUS

## 2018-07-20 MED ORDER — ONDANSETRON HCL 4 MG/2ML IJ SOLN
INTRAMUSCULAR | Status: AC
  Filled 2018-07-20: qty 2

## 2018-07-20 MED ORDER — MIDAZOLAM HCL 2 MG/2ML IJ SOLN
INTRAMUSCULAR | Status: DC | PRN
  Administered 2018-07-20: 2 mg via INTRAVENOUS

## 2018-07-20 MED ORDER — EPHEDRINE 5 MG/ML INJ
INTRAVENOUS | Status: AC
  Filled 2018-07-20: qty 10

## 2018-07-20 MED ORDER — LIDOCAINE 2% (20 MG/ML) 5 ML SYRINGE
INTRAMUSCULAR | Status: AC
  Filled 2018-07-20: qty 5

## 2018-07-20 MED ORDER — LACTATED RINGERS IV SOLN
INTRAVENOUS | Status: DC
  Administered 2018-07-20 (×2): via INTRAVENOUS
  Filled 2018-07-20: qty 1000

## 2018-07-20 MED ORDER — FENTANYL CITRATE (PF) 100 MCG/2ML IJ SOLN
INTRAMUSCULAR | Status: DC | PRN
  Administered 2018-07-20 (×2): 25 ug via INTRAVENOUS
  Administered 2018-07-20: 50 ug via INTRAVENOUS

## 2018-07-20 MED ORDER — HYDROCODONE-ACETAMINOPHEN 10-325 MG PO TABS: 1.0000 | ORAL_TABLET | ORAL | 0 refills | Status: DC | PRN

## 2018-07-20 MED ORDER — HYDROMORPHONE HCL 1 MG/ML IJ SOLN
INTRAMUSCULAR | Status: AC
  Filled 2018-07-20: qty 1

## 2018-07-20 MED ORDER — PROPOFOL 10 MG/ML IV BOLUS
INTRAVENOUS | Status: DC | PRN
  Administered 2018-07-20: 180 mg via INTRAVENOUS

## 2018-07-20 MED ORDER — DEXAMETHASONE SODIUM PHOSPHATE 10 MG/ML IJ SOLN
INTRAMUSCULAR | Status: AC
  Filled 2018-07-20: qty 1

## 2018-07-20 MED ORDER — MEPERIDINE HCL 25 MG/ML IJ SOLN
6.2500 mg | INTRAMUSCULAR | Status: DC | PRN
  Filled 2018-07-20: qty 1

## 2018-07-20 MED ORDER — MIDAZOLAM HCL 2 MG/2ML IJ SOLN
INTRAMUSCULAR | Status: AC
  Filled 2018-07-20: qty 2

## 2018-07-20 MED ORDER — HYDROMORPHONE HCL 1 MG/ML IJ SOLN
0.2500 mg | INTRAMUSCULAR | Status: DC | PRN
  Administered 2018-07-20 (×2): 0.25 mg via INTRAVENOUS
  Filled 2018-07-20: qty 0.5

## 2018-07-20 MED ORDER — LIDOCAINE 2% (20 MG/ML) 5 ML SYRINGE
INTRAMUSCULAR | Status: DC | PRN
  Administered 2018-07-20: 60 mg via INTRAVENOUS

## 2018-07-20 MED ORDER — CIPROFLOXACIN IN D5W 400 MG/200ML IV SOLN
400.0000 mg | Freq: Once | INTRAVENOUS | Status: AC
  Administered 2018-07-20: 400 mg via INTRAVENOUS
  Filled 2018-07-20: qty 200

## 2018-07-20 MED ORDER — ONDANSETRON HCL 4 MG/2ML IJ SOLN
4.0000 mg | Freq: Once | INTRAMUSCULAR | Status: DC | PRN
  Filled 2018-07-20: qty 2

## 2018-07-20 MED ORDER — PROPOFOL 10 MG/ML IV BOLUS
INTRAVENOUS | Status: AC
  Filled 2018-07-20: qty 20

## 2018-07-20 MED ORDER — DEXAMETHASONE SODIUM PHOSPHATE 10 MG/ML IJ SOLN
INTRAMUSCULAR | Status: DC | PRN
  Administered 2018-07-20: 10 mg via INTRAVENOUS

## 2018-07-20 SURGICAL SUPPLY — 29 items
BAG DRAIN URO-CYSTO SKYTR STRL (DRAIN) ×2 IMPLANT
BAG DRN UROCATH (DRAIN) ×1
BASKET ZERO TIP NITINOL 2.4FR (BASKET) ×1 IMPLANT
BSKT STON RTRVL ZERO TP 2.4FR (BASKET) ×1
CATH INTERMIT  6FR 70CM (CATHETERS) ×1 IMPLANT
CATH URET 5FR 28IN CONE TIP (BALLOONS)
CATH URET 5FR 70CM CONE TIP (BALLOONS) IMPLANT
CLOTH BEACON ORANGE TIMEOUT ST (SAFETY) ×2 IMPLANT
EXTRACTOR STONE 1.7FRX115CM (UROLOGICAL SUPPLIES) IMPLANT
FIBER LASER FLEXIVA 365 (UROLOGICAL SUPPLIES) ×1 IMPLANT
FIBER LASER TRAC TIP (UROLOGICAL SUPPLIES) IMPLANT
GLOVE BIO SURGEON STRL SZ8 (GLOVE) ×2 IMPLANT
GLOVE BIOGEL PI IND STRL 6.5 (GLOVE) IMPLANT
GLOVE BIOGEL PI INDICATOR 6.5 (GLOVE) ×4
GOWN STRL REUS W/TWL XL LVL3 (GOWN DISPOSABLE) ×2 IMPLANT
GUIDEWIRE 0.038 PTFE COATED (WIRE) IMPLANT
GUIDEWIRE ANG ZIPWIRE 038X150 (WIRE) IMPLANT
GUIDEWIRE STR DUAL SENSOR (WIRE) ×2 IMPLANT
INFUSOR MANOMETER BAG 3000ML (MISCELLANEOUS) ×1 IMPLANT
IV NS IRRIG 3000ML ARTHROMATIC (IV SOLUTION) ×4 IMPLANT
KIT TURNOVER CYSTO (KITS) ×2 IMPLANT
MANIFOLD NEPTUNE II (INSTRUMENTS) ×1 IMPLANT
NS IRRIG 500ML POUR BTL (IV SOLUTION) ×2 IMPLANT
PACK CYSTO (CUSTOM PROCEDURE TRAY) ×2 IMPLANT
SHEATH URETERAL 12FRX35CM (MISCELLANEOUS) ×1 IMPLANT
STENT POLARIS 5FRX24 (STENTS) ×1 IMPLANT
TUBE CONNECTING 12X1/4 (SUCTIONS) ×1 IMPLANT
TUBING UROLOGY SET (TUBING) ×1 IMPLANT
WATER STERILE IRR 3000ML UROMA (IV SOLUTION) IMPLANT

## 2018-07-20 NOTE — Discharge Instructions (Signed)
Post stone removal/stent placement surgery instructions ° ° °Definitions: ° °Ureter: The duct that transports urine from the kidney to the bladder. °Stent: A plastic hollow tube that is placed into the ureter, from the kidney to the bladder to prevent the ureter from swelling shut. ° °General instructions: ° °Despite the fact that no skin incisions were used, the area around the ureter and bladder is raw and irritated. The stent is a foreign body which will further irritate the bladder wall. This irritation is manifested by increased frequency of urination, both day and night, and by an increase in the urge to urinate. In some, the urge to urinate is present almost always. Sometimes the urge is strong enough that you may not be able to stop your self from urinating. The only real cure is to remove the stent and then give time for the bladder wall to heal which can't be done until the danger of the ureter swelling shut has passed. (This varies from 2-21 days). ° °You may see some blood in your urine while the stent is in place and a few days afterward. Do not be alarmed, even if the urine is clear for a while. Get off your feet and drink lots of fluids until clearing occurs. If you start to pass clots or don't improve, call us. ° °If you have a string coming from your urethra:  The stent string is attached to your ureteral stent.  Do not pull on thisIf you have a string coming from your urethra:  The stent string is attached to your ureteral stent.  Do not pull on this. ° °Diet: ° °You may return to your normal diet immediately. Because of the raw surface of your bladder, alcohol, spicy foods, foods high in acid and drinks with caffeine may cause irritation or frequency and should be used in moderation. To keep your urine flowing freely and avoid constipation, drink plenty of fluids during the day (8-10 glasses). Tip: Avoid cranberry juice because it is very acidic. ° °Activity: ° °Your physical activity doesn't need  to be restricted. However, if you are very active, you may see some blood in the urine. We suggest that you reduce your activity under the circumstances until the bleeding has stopped. ° °Bowels: ° °It is important to keep your bowels regular during the postoperative period. Straining with bowel movements can cause bleeding. A bowel movement every other day is reasonable. Use a mild laxative if needed, such as milk of magnesia 2-3 tablespoons, or 2 Dulcolax tablets. Call if you continue to have problems. If you had been taking narcotics for pain, before, during or after your surgery, you may be constipated. Take a laxative if necessary. ° ° ° ° °Medication: ° °You should resume your pre-surgery medications unless told not to. DO NOT RESUME YOUR ASPIRIN, or any other medicines like ibuprofen, motrin, excedrin, advil, aleve, vitamin E, fish oil as these can all cause bleeding x 7 days. In addition you may be given an antibiotic to prevent or treat infection. Antibiotics are not always necessary. All medication should be taken as prescribed until the bottles are finished unless you are having an unusual reaction to one of the drugs. ° °Problems you should report to us: ° °a. Fever greater than 101°F. °b. Heavy bleeding, or clots (see notes above about blood in urine). °c. Inability to urinate. °d. Drug reactions (hives, rash, nausea, vomiting, diarrhea). °e. Severe burning or pain with urination that is not improving. ° °Followup: ° °  You will need a followup appointment to monitor your progress in most cases. Please call the office for this appointment when you get home if your appointment has not already been scheduled. Usually the first appointment will be about 5-14 days after your surgery and if you have a stent in place it will likely be removed at that time. ° ° °Post Anesthesia Home Care Instructions ° °Activity: °Get plenty of rest for the remainder of the day. A responsible individual must stay with you for 24  hours following the procedure.  °For the next 24 hours, DO NOT: °-Drive a car °-Operate machinery °-Drink alcoholic beverages °-Take any medication unless instructed by your physician °-Make any legal decisions or sign important papers. ° °Meals: °Start with liquid foods such as gelatin or soup. Progress to regular foods as tolerated. Avoid greasy, spicy, heavy foods. If nausea and/or vomiting occur, drink only clear liquids until the nausea and/or vomiting subsides. Call your physician if vomiting continues. ° °Special Instructions/Symptoms: °Your throat may feel dry or sore from the anesthesia or the breathing tube placed in your throat during surgery. If this causes discomfort, gargle with warm salt water. The discomfort should disappear within 24 hours. ° ° °

## 2018-07-20 NOTE — Anesthesia Procedure Notes (Signed)
Procedure Name: LMA Insertion Date/Time: 07/20/2018 7:32 AM Performed by: Suan Halter, CRNA Pre-anesthesia Checklist: Patient identified, Emergency Drugs available, Suction available and Patient being monitored Patient Re-evaluated:Patient Re-evaluated prior to induction Oxygen Delivery Method: Circle system utilized Preoxygenation: Pre-oxygenation with 100% oxygen Induction Type: IV induction Ventilation: Mask ventilation without difficulty LMA: LMA inserted LMA Size: 4.0 Number of attempts: 1 Airway Equipment and Method: Bite block Placement Confirmation: positive ETCO2 Tube secured with: Tape Dental Injury: Teeth and Oropharynx as per pre-operative assessment

## 2018-07-20 NOTE — Anesthesia Preprocedure Evaluation (Signed)
Anesthesia Evaluation  Patient identified by MRN, date of birth, ID band Patient awake    Reviewed: Allergy & Precautions, NPO status , Patient's Chart, lab work & pertinent test results  Airway Mallampati: I  TM Distance: >3 FB Neck ROM: Full    Dental   Pulmonary    Pulmonary exam normal        Cardiovascular hypertension, Pt. on medications + CAD, + Past MI and + Cardiac Stents  Normal cardiovascular exam     Neuro/Psych    GI/Hepatic GERD  Medicated and Controlled,  Endo/Other    Renal/GU      Musculoskeletal   Abdominal   Peds  Hematology   Anesthesia Other Findings   Reproductive/Obstetrics                             Anesthesia Physical Anesthesia Plan  ASA: III  Anesthesia Plan: General   Post-op Pain Management:    Induction: Intravenous  PONV Risk Score and Plan: 3 and Ondansetron, Midazolam and Treatment may vary due to age or medical condition  Airway Management Planned: LMA  Additional Equipment:   Intra-op Plan:   Post-operative Plan: Extubation in OR  Informed Consent: I have reviewed the patients History and Physical, chart, labs and discussed the procedure including the risks, benefits and alternatives for the proposed anesthesia with the patient or authorized representative who has indicated his/her understanding and acceptance.     Plan Discussed with: CRNA and Surgeon  Anesthesia Plan Comments:         Anesthesia Quick Evaluation

## 2018-07-20 NOTE — Op Note (Signed)
PATIENT:  [name] [name]  PRE-OPERATIVE DIAGNOSIS:  left Renal calculi  POST-OPERATIVE DIAGNOSIS: Same  PROCEDURE:  1. Cystoscopy with left retrograde pyelogram including interpretation. 2. Left ureteroscopy, laser lithotripsy and stone extraction. 3. Left double-J stent placement. 4. Fluoroscopy use less than 1 hour.  SURGEON: Claybon Jabs, MD  INDICATION: [name] [name] is a 67 year old female who was found to have bilateral renal calculi and underwent treatment of her right renal calculi.  She has 3 left renal calculi.  One seems to be mobile and is the largest in size located in the renal pelvis region and there are 2 other stones in the lower pole.  She is brought to the operating room for management of these renal calculi with ureteroscopy and laser lithotripsy.  ANESTHESIA:  General  EBL:  Minimal  DRAINS: 5 French, 24 cm Polaris stent (with string, tucked in the vagina)  SPECIMEN: Stone given to patient  DESCRIPTION OF PROCEDURE: The patient was taken to the major OR and placed on the table. General anesthesia was administered and then the patient was moved to the dorsal lithotomy position. The genitalia was sterilely prepped and draped. An official timeout was performed.  Initially the 4 French cystoscope with 30 lens was passed under direct vision into the bladder. The bladder was then fully inspected. It was noted be free of any tumors, stones or inflammatory lesions. Ureteral orifices were of normal configuration and position. A 6 French open-ended ureteral catheter was then passed through the cystoscope into the ureteral orifice in order to perform a left retrograde pyelogram.  A retrograde pyelogram was performed by injecting full-strength contrast up the left ureter under direct fluoroscopic control. It revealed a filling defect in the renal pelvis consistent with the stone seen on the previous KUB. The remainder of the ureter was noted to be normal as was the intrarenal  collecting system. I then passed a 0.038 inch floppy-tipped guidewire through the open ended catheter and into the area of the renal pelvis and this was left in place. The inner portion of a ureteral access sheath was then passed over the guidewire to gently dilate the intramural ureter after which I passed the ureteral access sheath over the guidewire into the area the renal pelvis and remove the inner portion of the access sheath as well as guidewire. I then proceeded with ureteroscopy.  A 6 French flexible, digital ureteroscope was then passed through the access sheath and up the ureter into the renal pelvis. The stone was identified and I felt it was too large to extract and therefore elected to proceed with laser lithotripsy. The 365  holmium laser fiber was used to fragment the stone. I then used the nitinol basket to extract the larger stone fragments.  I also dusted a large portion of the stone.  The stone that I treated was the largest stone located in the renal pelvis I then inspected the middle and lower pole calyces but was unable to identify the 2 stones seen on fluoroscopy and previous KUB.  I did identify a stenosed infundibulum and it appears the stones are behind this and are not at risk of passage.  Because I could not identify the stones they could not be treated with laser lithotripsy but by the same token since I could not see the stones with the ureteroscope they were not going to ever be able to pass and cause colic.  Reinspection of each calyx revealed no stone fragments larger than 0.5 mm.  I therefore passed the guidewire through the ureteroscope and left this in place removing the ureteroscope and the access sheath.  I then backloaded the cystoscope over the guidewire and passed the stent over the guidewire into the area of the renal pelvis. As the guidewire was removed good curl was noted in the renal pelvis. The bladder was drained and the cystoscope was then removed. The patient  tolerated the procedure well no intraoperative complications.  PLAN OF CARE: Discharge to home after PACU  PATIENT DISPOSITION:  PACU - hemodynamically stable.

## 2018-07-20 NOTE — Transfer of Care (Signed)
Immediate Anesthesia Transfer of Care Note  Patient: [name] [name]  Procedure(s) Performed: Procedure(s) (LRB): CYSTOSCOPY/RETROGRADE/URETEROSCOPY/HOLMIUM LASER/STENT PLACEMENT (Left)  Patient Location: PACU  Anesthesia Type: General  Level of Consciousness: awake, oriented, sedated and patient cooperative  Airway & Oxygen Therapy: Patient Spontanous Breathing and Patient connected to face mask oxygen  Post-op Assessment: Report given to PACU RN and Post -op Vital signs reviewed and stable  Post vital signs: Reviewed and stable  Complications: No apparent anesthesia complications Last Vitals:  Vitals Value Taken Time  BP    Temp    Pulse    Resp    SpO2      Last Pain:  Vitals:   07/20/18 0535  TempSrc: Oral

## 2018-07-20 NOTE — Anesthesia Postprocedure Evaluation (Signed)
Anesthesia Post Note  Patient: [name] [name]  Procedure(s) Performed: CYSTOSCOPY/RETROGRADE/URETEROSCOPY/HOLMIUM LASER/STENT PLACEMENT (Left Ureter)     Patient location during evaluation: PACU Anesthesia Type: General Level of consciousness: awake and alert Pain management: pain level controlled Vital Signs Assessment: post-procedure vital signs reviewed and stable Respiratory status: spontaneous breathing, nonlabored ventilation, respiratory function stable and patient connected to nasal cannula oxygen Cardiovascular status: blood pressure returned to baseline and stable Postop Assessment: no apparent nausea or vomiting Anesthetic complications: no    Last Vitals:  Vitals:   07/20/18 1000 07/20/18 1045  BP: 113/63 118/61  Pulse: (!) 59 65  Resp: 13 16  Temp:  36.6 C  SpO2: 93% 98%    Last Pain:  Vitals:   07/20/18 1045  TempSrc: Oral  PainSc:                  Laryssa Hassing DAVID

## 2018-07-23 ENCOUNTER — Encounter (HOSPITAL_BASED_OUTPATIENT_CLINIC_OR_DEPARTMENT_OTHER): Payer: Self-pay | Admitting: Urology

## 2018-07-27 DIAGNOSIS — N200 Calculus of kidney: Secondary | ICD-10-CM | POA: Diagnosis not present

## 2018-08-15 ENCOUNTER — Telehealth: Payer: Self-pay | Admitting: Cardiovascular Disease

## 2018-08-15 NOTE — Telephone Encounter (Signed)
Attempted to send in separate orders for Irbesartan and Hctz and plain Irbesartan is on back order as well. Will forward to Raquel Rodriguez-Guzman PharmD for recommendations./cy

## 2018-08-15 NOTE — Telephone Encounter (Signed)
Per chart review, the best available option is valsartan/htcz 160/12.5. Monitor BP 2-3 times a week for the next 3 weeks and contact the office for any concerns.

## 2018-08-15 NOTE — Telephone Encounter (Signed)
New Message:    Pt says her Irbesartan is on back order at her Pharmacy. She will be out of it by this weekend. She wants to know what she needs to do?

## 2018-08-20 NOTE — Telephone Encounter (Signed)
Per pt pharmacy called on Sunday was able to get Irbesartan/hctz in stock so pt did not go without.Per pt will call if has any other issues on getting this med .Adonis Housekeeper

## 2018-08-22 ENCOUNTER — Other Ambulatory Visit: Payer: Self-pay | Admitting: Cardiovascular Disease

## 2018-09-03 DIAGNOSIS — N200 Calculus of kidney: Secondary | ICD-10-CM | POA: Diagnosis not present

## 2018-09-17 ENCOUNTER — Ambulatory Visit (INDEPENDENT_AMBULATORY_CARE_PROVIDER_SITE_OTHER): Payer: PPO

## 2018-09-17 ENCOUNTER — Ambulatory Visit (INDEPENDENT_AMBULATORY_CARE_PROVIDER_SITE_OTHER): Payer: PPO | Admitting: Orthopaedic Surgery

## 2018-09-17 ENCOUNTER — Encounter (INDEPENDENT_AMBULATORY_CARE_PROVIDER_SITE_OTHER): Payer: Self-pay | Admitting: Orthopaedic Surgery

## 2018-09-17 DIAGNOSIS — M25561 Pain in right knee: Secondary | ICD-10-CM | POA: Diagnosis not present

## 2018-09-17 DIAGNOSIS — M7631 Iliotibial band syndrome, right leg: Secondary | ICD-10-CM

## 2018-09-17 MED ORDER — LIDOCAINE HCL 1 % IJ SOLN
3.0000 mL | INTRAMUSCULAR | Status: AC | PRN
  Administered 2018-09-17: 3 mL

## 2018-09-17 MED ORDER — METHYLPREDNISOLONE ACETATE 40 MG/ML IJ SUSP
40.0000 mg | INTRAMUSCULAR | Status: AC | PRN
  Administered 2018-09-17: 40 mg via INTRA_ARTICULAR

## 2018-09-17 NOTE — Progress Notes (Signed)
Office Visit Note   Patient: [name] [name]           Date of Birth: 1951-04-07           MRN: [id number] Visit Date: 09/17/2018              Requested by: Prince Solian, MD [address] [place], [place] [address] PCP: Prince Solian, MD   Assessment & Plan: Visit Diagnoses:  1. Acute pain of right knee   2. It band syndrome, right     Plan: Based on her exam and signs and symptoms I do feel this is IT band syndrome.  I showed her some stretching exercises to try and did place a steroid injection around the maximum point of tenderness.  I did give her some samples of a topical anti-inflammatory to try.  All question concerns were answered and addressed.  Follow be as needed.  If this worsens in any way I would send her to outpatient formal physical therapy.  She understands that as well.  Follow-Up Instructions: Return if symptoms worsen or fail to improve.   Orders:  Orders Placed This Encounter  Procedures  . Large Joint Inj  . XR Knee 1-2 Views Right   No orders of the defined types were placed in this encounter.     Procedures: Large Joint Inj: R knee on 09/17/2018 2:07 PM Indications: diagnostic evaluation and pain Details: 22 G 1.5 in needle, superolateral approach  Arthrogram: No  Medications: 3 mL lidocaine 1 %; 40 mg methylPREDNISolone acetate 40 MG/ML Outcome: tolerated well, no immediate complications Procedure, treatment alternatives, risks and benefits explained, specific risks discussed. Consent was given by the patient. Immediately prior to procedure a time out was called to verify the correct patient, procedure, equipment, support staff and site/side marked as required. Patient was prepped and draped in the usual sterile fashion.       Clinical Data: No additional findings.   Subjective: Chief Complaint  Patient presents with  . Right Knee - Pain  The patient is a very pleasant 67 year old well-known to [place].  She comes in with lateral right  knee pain is been getting worse for several weeks now.  She is an avid Air cabin crew.  She denies any specific injury.  She denies any numbness or swelling or effusion.  She denies any locking catching with her right knee.  She points lateral aspect of her knee is as of her pain.  She cannot take anti-inflammatories due to previous heart issues.  HPI  Review of Systems She currently denies any headache, chest pain, shortness of breath, fever, chills, nausea, vomiting.  Objective: Vital Signs: There were no vitals taken for this visit.  Physical Exam She is alert and oriented x3 and in no acute distress Ortho Exam Examination of right knee shows pain only over the lateral aspect the knee at the IT band and not the knee joint itself.  The knee exam proper is normal in terms of the knee joint. Specialty Comments:  No specialty comments available.  Imaging: Xr Knee 1-2 Views Right  Result Date: 09/17/2018 2 views of the right knee show no acute findings.  The joint space is well-maintained and the alignment is well-maintained.    PMFS History: Patient Active Problem List   Diagnosis Date Noted  . Essential hypertension 03/31/2017  . Left ventricular dysfunction 03/31/2017  . Nephrolithiasis 03/31/2017  . Mild obesity 03/31/2017  . Mixed hyperlipidemia 03/14/2017  . NSTEMI (non-ST elevated myocardial infarction) (  [place]) 02/23/2017   Past Medical History:  Diagnosis Date  . Basal cell carcinoma of nose    S/P cream, burn, cut off w/skin graft  . Chronic lower back pain   . Coronary artery disease   . Eczema   . GERD (gastroesophageal reflux disease)   . High cholesterol   . History of kidney stones   . Hypertension   . Myocardial infarction ([facility]) 02/23/2017   Non-stemi  . Osteoarthritis    "severe in neck, lower back, hands" (02/24/2017)  . Sinus headache     Family History  Problem Relation Age of Onset  . Heart attack Father   . Heart attack Paternal Grandmother     Past  Surgical History:  Procedure Laterality Date  . BASAL CELL CARCINOMA EXCISION     w/skin graft to my nose  . BREAST DUCTAL SYSTEM EXCISION Right 1991   milk duct  . CARPAL TUNNEL RELEASE Right 1996  . COLONOSCOPY    . CORONARY ANGIOPLASTY WITH STENT PLACEMENT  02/24/2017   Ramus lesion, 60 %stenosed, A STENT RESOLUTE ONYX 2.5X26 drug eluting stent was successfully placed.Ost Ramus lesion, 100 %stenosed.Post intervention, there is a 0% residual stenosis.  . CORONARY STENT INTERVENTION N/A 02/24/2017   Procedure: Coronary Stent Intervention;  Surgeon: [facility] Sine, MD;  Location: [facility];  Service: Cardiovascular;  Laterality: N/A;  ramus  . CYST EXCISION Left 2002   arthritic cysts on index finger  . CYSTOSCOPY W/ STONE MANIPULATION Left 04/2006   kidney stone on the left obtained  . CYSTOSCOPY/URETEROSCOPY/HOLMIUM LASER/STENT PLACEMENT Right 03/16/2018   Procedure: CYSTOSCOPY/RETROGRADE/URETEROSCOPY/HOLMIUM LASER/STENT PLACEMENT;  Surgeon: Kathie Rhodes, MD;  Location: [facility];  Service: Urology;  Laterality: Right;  . CYSTOSCOPY/URETEROSCOPY/HOLMIUM LASER/STENT PLACEMENT Left 07/20/2018   Procedure: CYSTOSCOPY/RETROGRADE/URETEROSCOPY/HOLMIUM LASER/STENT PLACEMENT;  Surgeon: Kathie Rhodes, MD;  [facility];  Service: Urology;  Laterality: Left;  . DILATION AND CURETTAGE OF UTERUS    . EXTRACORPOREAL SHOCK WAVE LITHOTRIPSY  03/2014   Archie Endo 03/10/2014  . EXTRACORPOREAL SHOCK WAVE LITHOTRIPSY Left 10/05/2017   Procedure: LEFT EXTRACORPOREAL SHOCK WAVE LITHOTRIPSY (ESWL);  Surgeon: Kathie Rhodes, MD;  Location: [facility];  Service: Urology;  Laterality: Left;  . LEFT HEART CATH AND CORONARY ANGIOGRAPHY N/A 02/24/2017   Procedure: Left Heart Cath and Coronary Angiography;  Surgeon: [facility] Sine, MD;  Location: [facility];  Service: Cardiovascular;  Laterality: N/A;  . VAGINAL HYSTERECTOMY    . WISDOM TOOTH EXTRACTION     Social History   Occupational  History  . Not on file  Tobacco Use  . Smoking status: Never Smoker  . Smokeless tobacco: Never Used  Substance and Sexual Activity  . Alcohol use: No  . Drug use: No  . Sexual activity: Yes    Birth control/protection: Surgical

## 2018-10-01 ENCOUNTER — Other Ambulatory Visit: Payer: Self-pay | Admitting: Cardiovascular Disease

## 2018-10-01 NOTE — Telephone Encounter (Signed)
Rx request sent to pharmacy.  

## 2018-10-02 ENCOUNTER — Other Ambulatory Visit: Payer: Self-pay | Admitting: Cardiovascular Disease

## 2018-11-05 DIAGNOSIS — Z0000 Encounter for general adult medical examination without abnormal findings: Secondary | ICD-10-CM | POA: Diagnosis not present

## 2018-11-05 DIAGNOSIS — R82998 Other abnormal findings in urine: Secondary | ICD-10-CM | POA: Diagnosis not present

## 2018-11-05 DIAGNOSIS — I10 Essential (primary) hypertension: Secondary | ICD-10-CM | POA: Diagnosis not present

## 2018-11-05 DIAGNOSIS — E7849 Other hyperlipidemia: Secondary | ICD-10-CM | POA: Diagnosis not present

## 2018-11-12 DIAGNOSIS — I10 Essential (primary) hypertension: Secondary | ICD-10-CM | POA: Diagnosis not present

## 2018-11-12 DIAGNOSIS — M5380 Other specified dorsopathies, site unspecified: Secondary | ICD-10-CM | POA: Diagnosis not present

## 2018-11-12 DIAGNOSIS — K635 Polyp of colon: Secondary | ICD-10-CM | POA: Diagnosis not present

## 2018-11-12 DIAGNOSIS — E7849 Other hyperlipidemia: Secondary | ICD-10-CM | POA: Diagnosis not present

## 2018-11-12 DIAGNOSIS — N200 Calculus of kidney: Secondary | ICD-10-CM | POA: Diagnosis not present

## 2018-11-12 DIAGNOSIS — J302 Other seasonal allergic rhinitis: Secondary | ICD-10-CM | POA: Diagnosis not present

## 2018-11-12 DIAGNOSIS — Z1389 Encounter for screening for other disorder: Secondary | ICD-10-CM | POA: Diagnosis not present

## 2018-11-12 DIAGNOSIS — I213 ST elevation (STEMI) myocardial infarction of unspecified site: Secondary | ICD-10-CM | POA: Diagnosis not present

## 2018-11-12 DIAGNOSIS — N183 Chronic kidney disease, stage 3 (moderate): Secondary | ICD-10-CM | POA: Diagnosis not present

## 2018-11-12 DIAGNOSIS — Z0000 Encounter for general adult medical examination without abnormal findings: Secondary | ICD-10-CM | POA: Diagnosis not present

## 2018-11-12 DIAGNOSIS — K219 Gastro-esophageal reflux disease without esophagitis: Secondary | ICD-10-CM | POA: Diagnosis not present

## 2018-11-12 DIAGNOSIS — Z23 Encounter for immunization: Secondary | ICD-10-CM | POA: Diagnosis not present

## 2018-11-12 DIAGNOSIS — Z6833 Body mass index (BMI) 33.0-33.9, adult: Secondary | ICD-10-CM | POA: Diagnosis not present

## 2019-01-08 ENCOUNTER — Other Ambulatory Visit: Payer: Self-pay | Admitting: Cardiovascular Disease

## 2019-01-27 ENCOUNTER — Other Ambulatory Visit: Payer: Self-pay | Admitting: Cardiovascular Disease

## 2019-02-06 DIAGNOSIS — D3132 Benign neoplasm of left choroid: Secondary | ICD-10-CM | POA: Diagnosis not present

## 2019-02-06 DIAGNOSIS — H2513 Age-related nuclear cataract, bilateral: Secondary | ICD-10-CM | POA: Diagnosis not present

## 2019-02-08 ENCOUNTER — Ambulatory Visit (INDEPENDENT_AMBULATORY_CARE_PROVIDER_SITE_OTHER): Payer: PPO | Admitting: Family Medicine

## 2019-02-08 ENCOUNTER — Encounter (INDEPENDENT_AMBULATORY_CARE_PROVIDER_SITE_OTHER): Payer: Self-pay | Admitting: Family Medicine

## 2019-02-08 ENCOUNTER — Telehealth (INDEPENDENT_AMBULATORY_CARE_PROVIDER_SITE_OTHER): Payer: Self-pay

## 2019-02-08 DIAGNOSIS — M25561 Pain in right knee: Secondary | ICD-10-CM

## 2019-02-08 MED ORDER — METHYLPREDNISOLONE ACETATE 40 MG/ML IJ SUSP: 40.0000 mg | Freq: Once | INTRAMUSCULAR | Status: DC

## 2019-02-08 NOTE — Telephone Encounter (Signed)
Patients husband Alvester Chou called LM on triage phone stating she needed to be seen for her right knee due to acute pain and swelling. I tried calling her back at number provided on VM [phone] but patient did not answer. LM that I was calling to schedule her an appt

## 2019-02-08 NOTE — Progress Notes (Signed)
Office Visit Note   Patient: [name] [name]           Date of Birth: August 09, 1951           MRN: [id number] Visit Date: 02/08/2019 Requested by: Prince Solian, MD [address] [place], [place] [address] PCP: Prince Solian, MD  Subjective: Chief Complaint  Patient presents with  . Right Knee - Pain    IT band had gotten better (been seeing Dr. Ninfa Linden). But now, having pain medial aspect with swelling x 1-2 weeks. No new injury.    HPI: She is here with recurrent right knee pain.  IT band injection gave good relief.  A few weeks ago she started noticing stiffness and pain with swelling in her knee, mostly pain on the medial aspect.  No locking or catching.              ROS: Otherwise noncontributory  Objective: Vital Signs: There were no vitals taken for this visit.  Physical Exam:  Right knee: 2+ effusion, no warmth or erythema.  Tender on the medial joint line, no palpable click with McMurray's.  Full extension and flexion.   Imaging: None today.  Assessment & Plan: 1.  Right knee pain and effusion, etiology uncertain.  Possible degenerative meniscus tear. -Discussed with patient and elected to aspirate and inject the knee.  Could contemplate MRI scan if symptoms persist.     Procedures: Right knee aspiration and injection: After sterile prep with Betadine, injected 5 cc 1% lidocaine without epinephrine then aspirated 40 cc of clear yellow synovial fluid, then injected 40 mg methylprednisolone from superolateral approach.    PMFS History: Patient Active Problem List   Diagnosis Date Noted  . Essential hypertension 03/31/2017  . Left ventricular dysfunction 03/31/2017  . Nephrolithiasis 03/31/2017  . Mild obesity 03/31/2017  . Mixed hyperlipidemia 03/14/2017  . NSTEMI (non-ST elevated myocardial infarction) ([place]) 02/23/2017   Past Medical History:  Diagnosis Date  . Basal cell carcinoma of nose    S/P cream, burn, cut off w/skin graft  . Chronic lower  back pain   . Coronary artery disease   . Eczema   . GERD (gastroesophageal reflux disease)   . High cholesterol   . History of kidney stones   . Hypertension   . Myocardial infarction ([facility]) 02/23/2017   Non-stemi  . Osteoarthritis    "severe in neck, lower back, hands" (02/24/2017)  . Sinus headache     Family History  Problem Relation Age of Onset  . Heart attack Father   . Heart attack Paternal Grandmother     Past Surgical History:  Procedure Laterality Date  . BASAL CELL CARCINOMA EXCISION     w/skin graft to my nose  . BREAST DUCTAL SYSTEM EXCISION Right 1991   milk duct  . CARPAL TUNNEL RELEASE Right 1996  . COLONOSCOPY    . CORONARY ANGIOPLASTY WITH STENT PLACEMENT  02/24/2017   Ramus lesion, 60 %stenosed, A STENT RESOLUTE ONYX 2.5X26 drug eluting stent was successfully placed.Ost Ramus lesion, 100 %stenosed.Post intervention, there is a 0% residual stenosis.  . CORONARY STENT INTERVENTION N/A 02/24/2017   Procedure: Coronary Stent Intervention;  Surgeon: [facility] Sine, MD;  Location: [facility];  Service: Cardiovascular;  Laterality: N/A;  ramus  . CYST EXCISION Left 2002   arthritic cysts on index finger  . CYSTOSCOPY W/ STONE MANIPULATION Left 04/2006   kidney stone on the left obtained  . CYSTOSCOPY/URETEROSCOPY/HOLMIUM LASER/STENT PLACEMENT Right 03/16/2018  Procedure: CYSTOSCOPY/RETROGRADE/URETEROSCOPY/HOLMIUM LASER/STENT PLACEMENT;  Surgeon: Kathie Rhodes, MD;  [facility];  Service: Urology;  Laterality: Right;  . CYSTOSCOPY/URETEROSCOPY/HOLMIUM LASER/STENT PLACEMENT Left 07/20/2018   Procedure: CYSTOSCOPY/RETROGRADE/URETEROSCOPY/HOLMIUM LASER/STENT PLACEMENT;  Surgeon: Kathie Rhodes, MD;  Location: [facility];  Service: Urology;  Laterality: Left;  . DILATION AND CURETTAGE OF UTERUS    . EXTRACORPOREAL SHOCK WAVE LITHOTRIPSY  03/2014   Archie Endo 03/10/2014  . EXTRACORPOREAL SHOCK WAVE LITHOTRIPSY Left 10/05/2017   Procedure: LEFT  EXTRACORPOREAL SHOCK WAVE LITHOTRIPSY (ESWL);  Surgeon: Kathie Rhodes, MD;  Location: [facility];  Service: Urology;  Laterality: Left;  . LEFT HEART CATH AND CORONARY ANGIOGRAPHY N/A 02/24/2017   Procedure: Left Heart Cath and Coronary Angiography;  Surgeon: [facility] Sine, MD;  Location: [facility];  Service: Cardiovascular;  Laterality: N/A;  . VAGINAL HYSTERECTOMY    . WISDOM TOOTH EXTRACTION     Social History   Occupational History  . Not on file  Tobacco Use  . Smoking status: Never Smoker  . Smokeless tobacco: Never Used  Substance and Sexual Activity  . Alcohol use: No  . Drug use: No  . Sexual activity: Yes    Birth control/protection: Surgical

## 2019-02-20 ENCOUNTER — Other Ambulatory Visit: Payer: Self-pay | Admitting: Cardiovascular Disease

## 2019-02-21 ENCOUNTER — Other Ambulatory Visit: Payer: Self-pay | Admitting: Cardiovascular Disease

## 2019-02-26 ENCOUNTER — Telehealth (INDEPENDENT_AMBULATORY_CARE_PROVIDER_SITE_OTHER): Payer: Self-pay

## 2019-02-26 NOTE — Telephone Encounter (Signed)
Called patient, answered no to all prescreening questions  

## 2019-02-27 ENCOUNTER — Encounter (INDEPENDENT_AMBULATORY_CARE_PROVIDER_SITE_OTHER): Payer: Self-pay | Admitting: Orthopaedic Surgery

## 2019-02-27 ENCOUNTER — Ambulatory Visit (INDEPENDENT_AMBULATORY_CARE_PROVIDER_SITE_OTHER): Payer: PPO | Admitting: Orthopaedic Surgery

## 2019-02-27 ENCOUNTER — Other Ambulatory Visit: Payer: Self-pay

## 2019-02-27 DIAGNOSIS — M25461 Effusion, right knee: Secondary | ICD-10-CM

## 2019-02-27 DIAGNOSIS — M25561 Pain in right knee: Secondary | ICD-10-CM

## 2019-02-27 NOTE — Progress Notes (Signed)
The patient is well-known to me.  She is an active 68 year old female with right knee recurrent effusions with pain and locking and catching.  This is all been going on since October of this past year.  She is very active.  She has had multiple steroid injections now and aspirations of her knee which has had serous fluid in her knee.  She is continues report locking catching in her knee.  She has been trying a knee sleeve and activity modification.  She is taken anti-inflammatories and rest of the knee.  She is work on Astronomer.  X-rays of the knee have shown well-maintained joint space with no acute findings.  On examination today she does have global tenderness of the right knee again.  There is a positive McMurray sign to the medial compartment.  There is some grinding of the patellofemoral joint.  At this point an MRI is warranted to rule out a meniscal tear based on the acute nature of her pain and the recurrent effusions and given the fact that she is failed conservative treatment.  Also given the fact that she has an x-ray of her right knee showing well-maintained joint space and the MRI is warranted to assess for any internal derangement and the cause of her recurrent effusions and pain with locking catching.  I feel this is medically warranted at this standpoint.  All question concerns were answered and addressed.  We will see her back once we have an MRI of her right knee.  She will continue her knee sleeve as well as her activity modification and quad strengthening exercises as well as anti-inflammatories.

## 2019-02-28 ENCOUNTER — Other Ambulatory Visit (INDEPENDENT_AMBULATORY_CARE_PROVIDER_SITE_OTHER): Payer: Self-pay

## 2019-02-28 DIAGNOSIS — M25561 Pain in right knee: Principal | ICD-10-CM

## 2019-02-28 DIAGNOSIS — G8929 Other chronic pain: Secondary | ICD-10-CM

## 2019-03-20 ENCOUNTER — Telehealth: Payer: Self-pay

## 2019-03-20 ENCOUNTER — Telehealth: Payer: Self-pay | Admitting: Cardiovascular Disease

## 2019-03-20 NOTE — Telephone Encounter (Signed)
F/U Message           Patient is returning "[name] [name]" call, she would like a call back

## 2019-03-20 NOTE — Telephone Encounter (Signed)
Left message to call back  

## 2019-03-21 ENCOUNTER — Telehealth: Payer: Self-pay

## 2019-03-21 NOTE — Telephone Encounter (Signed)
Virtual Visit Pre-Appointment Phone Call  Steps For Call:  1. Confirm consent - "In the setting of the current Covid19 crisis, you are scheduled for a (phone or video) visit with your provider on (03/26/19) at (4PM).  Just as we do with many in-office visits, in order for you to participate in this visit, we must obtain consent.  If you'd like, I can send this to your mychart (if signed up) or email for you to review.  Otherwise, I can obtain your verbal consent now.  All virtual visits are billed to your insurance company just like a normal visit would be.  By agreeing to a virtual visit, we'd like you to understand that the technology does not allow for your provider to perform an examination, and thus may limit your provider's ability to fully assess your condition.  Finally, though the technology is pretty good, we cannot assure that it will always work on either your or our end, and in the setting of a video visit, we may have to convert it to a phone-only visit.  In either situation, we cannot ensure that we have a secure connection.  Are you willing to proceed?" STAFF: Did the patient verbally acknowledge consent to telehealth visit? Document YES/NO here: YES  2. Confirm the BEST phone number to call the day of the visit by including in appointment notes  3. Give patient instructions for WebEx/MyChart download to smartphone as below or Doximity/Doxy.me if video visit (depending on what platform provider is using)  4. Advise patient to be prepared with their blood pressure, heart rate, weight, any heart rhythm information, their current medicines, and a piece of paper and pen handy for any instructions they may receive the day of their visit  5. Inform patient they will receive a phone call 15 minutes prior to their appointment time (may be from unknown caller ID) so they should be prepared to answer  6. Confirm that appointment type is correct in Epic appointment notes (VIDEO vs PHONE)      TELEPHONE CALL NOTE  KERIANN RANKIN has been deemed a candidate for a follow-up tele-health visit to limit community exposure during the Covid-19 pandemic. I spoke with the patient via phone to ensure availability of phone/video source, confirm preferred email & phone number, and discuss instructions and expectations.  I reminded [name] [name] to be prepared with any vital sign and/or heart rhythm information that could potentially be obtained via home monitoring, at the time of her visit. I reminded DAVION MEARA to expect a phone call at the time of her visit if her visit.  Crissie Reese, CMA 03/21/2019 10:53 AM   INSTRUCTIONS FOR DOWNLOADING THE WEBEX APP TO SMARTPHONE  - If Apple, ask patient to go to [employer] and type in WebEx in the search bar. [employer], the blue/green circle. If Android, go to [employer] and type in [employer] in the search bar. The app is free but as with any other app downloads, their phone may require them to verify saved payment information or Apple/Android password.  - The patient does NOT have to create an account. - On the day of the visit, the assist will walk the patient through joining the meeting with the meeting number/password.  INSTRUCTIONS FOR DOWNLOADING THE MYCHART APP TO SMARTPHONE  - The patient must first make sure to have activated MyChart and know their login information - If Apple, go to [employer] and type  in [place] in the search bar and download the app. If Android, ask patient to go to [employer] and type in [place] in the search bar and download the app. The app is free but as with any other app downloads, their phone may require them to verify saved payment information or Apple/Android password.  - The patient will need to then log into the app with their MyChart username and password, and select [facility] as their healthcare provider to link the account. When it is time for your visit, go to the  MyChart app, find appointments, and click Begin Video Visit. Be sure to Select Allow for your device to access the Microphone and Camera for your visit. You will then be connected, and your provider will be with you shortly.  **If they have any issues connecting, or need assistance please contact MyChart service desk [phone])**  **If using a computer, in order to ensure the best quality for their visit they will need to use either of the following Internet Browsers: [employer], or Google Chrome**  IF USING DOXIMITY or DOXY.ME - The patient will receive a link just prior to their visit, either by text or email (to be determined day of appointment depending on if it's doxy.me or Doximity).     FULL LENGTH CONSENT FOR TELE-HEALTH VISIT   I hereby voluntarily request, consent and authorize [facility] and its employed or contracted physicians, physician assistants, nurse practitioners or other licensed health care professionals (the Practitioner), to provide me with telemedicine health care services (the "Services") as deemed necessary by the treating Practitioner. I acknowledge and consent to receive the Services by the Practitioner via telemedicine. I understand that the telemedicine visit will involve communicating with the Practitioner through live audiovisual communication technology and the disclosure of certain medical information by electronic transmission. I acknowledge that I have been given the opportunity to request an in-person assessment or other available alternative prior to the telemedicine visit and am voluntarily participating in the telemedicine visit.  I understand that I have the right to withhold or withdraw my consent to the use of telemedicine in the course of my care at any time, without affecting my right to future care or treatment, and that the Practitioner or I may terminate the telemedicine visit at any time. I understand that I have the right to  inspect all information obtained and/or recorded in the course of the telemedicine visit and may receive copies of available information for a reasonable fee.  I understand that some of the potential risks of receiving the Services via telemedicine include:  Marland Kitchen Delay or interruption in medical evaluation due to technological equipment failure or disruption; . Information transmitted may not be sufficient (e.g. poor resolution of images) to allow for appropriate medical decision making by the Practitioner; and/or  . In rare instances, security protocols could fail, causing a breach of personal health information.  Furthermore, I acknowledge that it is my responsibility to provide information about my medical history, conditions and care that is complete and accurate to the best of my ability. I acknowledge that Practitioner's advice, recommendations, and/or decision may be based on factors not within their control, such as incomplete or inaccurate data provided by me or distortions of diagnostic images or specimens that may result from electronic transmissions. I understand that the practice of medicine is not an exact science and that Practitioner makes no warranties or guarantees regarding treatment outcomes. I acknowledge that I will receive a  copy of this consent concurrently upon execution via email to the email address I last provided but may also request a printed copy by calling the office of [facility].    I understand that my insurance will be billed for this visit.   I have read or had this consent read to me. . I understand the contents of this consent, which adequately explains the benefits and risks of the Services being provided via telemedicine.  . I have been provided ample opportunity to ask questions regarding this consent and the Services and have had my questions answered to my satisfaction. . I give my informed consent for the services to be provided through the use of  telemedicine in my medical care  By participating in this telemedicine visit I agree to the above.

## 2019-03-25 ENCOUNTER — Telehealth: Payer: Self-pay | Admitting: Cardiovascular Disease

## 2019-03-25 ENCOUNTER — Ambulatory Visit: Payer: PPO | Admitting: Cardiovascular Disease

## 2019-03-25 NOTE — Telephone Encounter (Signed)
Mychart pending, smartphone, pre reg complete 03/25/19 AF °

## 2019-03-26 ENCOUNTER — Encounter: Payer: Self-pay | Admitting: *Deleted

## 2019-03-26 ENCOUNTER — Telehealth (INDEPENDENT_AMBULATORY_CARE_PROVIDER_SITE_OTHER): Payer: PPO | Admitting: Cardiovascular Disease

## 2019-03-26 ENCOUNTER — Telehealth: Payer: Self-pay | Admitting: *Deleted

## 2019-03-26 DIAGNOSIS — E669 Obesity, unspecified: Secondary | ICD-10-CM

## 2019-03-26 DIAGNOSIS — I2510 Atherosclerotic heart disease of native coronary artery without angina pectoris: Secondary | ICD-10-CM | POA: Diagnosis not present

## 2019-03-26 DIAGNOSIS — I10 Essential (primary) hypertension: Secondary | ICD-10-CM | POA: Diagnosis not present

## 2019-03-26 DIAGNOSIS — E782 Mixed hyperlipidemia: Secondary | ICD-10-CM | POA: Diagnosis not present

## 2019-03-26 NOTE — Progress Notes (Signed)
Virtual Visit via Video Note   This visit type was conducted due to national recommendations for restrictions regarding the COVID-19 Pandemic (e.g. social distancing) in an effort to limit this patient's exposure and mitigate transmission in our community.  Due to her co-morbid illnesses, this patient is at least at moderate risk for complications without adequate follow up.  This format is felt to be most appropriate for this patient at this time.  All issues noted in this document were discussed and addressed.  A limited physical exam was performed with this format.  Please refer to the patient's chart for her consent to telehealth for [facility].   Evaluation Performed:  Follow-up visit  Date:  03/26/2019   ID:  [name], [name] 1951/02/13, MRN [id number]  Patient Location: Home Provider Location: Home  PCP:  Prince Solian, MD  Cardiologist:  Sanda Klein, MD  Electrophysiologist:  None   Chief Complaint: CAD follow-up  History of Present Illness:    [name] [name] is a 68 y.o. female with hypertension and hyperlipidemia and strong family history of premature coronary artery disease who presented with acute non-STEMI on 02/24/2017 due to occlusion of a ramus intermedius artery and received a drug-eluting stent (resolute onyx 2.5 26 mm). Left ventricular systolic function was mildly decreased at 50% and left ventricular end-diastolic pressure was minimally elevated at 20 mm Hg.  Follow-up echo in May 2018 showed complete resolution of the LV dysfunction; she has never had clinical heart failure.  Since those events 2 years ago she has not had any cardiac complaints.  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  She has no problems with angina or dyspnea either at rest or with activity.  She denies edema, palpitations, dizziness or syncope.  She's lost about 10 pounds since her acute infarction.  She has a lot of of knee pain.  This is partly related  to ileal trochanteric band syndrome, but her orthopedic surgeon has reported that she needs an MRI and he is concerned that she may have a ruptured ligament or meniscus.  Plain x-rays do not show evidence of arthritis.  After a protracted course of renal dysfunction and pyelonephritis due to multiple calcium oxalate kidney stones, over the last year things have settled down.  Her most recent labs show a creatinine of 1.1.  The patient does not have symptoms concerning for COVID-19 infection (fever, chills, cough, or new shortness of breath).    Past Medical History:  Diagnosis Date  . Basal cell carcinoma of nose    S/P cream, burn, cut off w/skin graft  . Chronic lower back pain   . Coronary artery disease   . Eczema   . GERD (gastroesophageal reflux disease)   . High cholesterol   . History of kidney stones   . Hypertension   . Myocardial infarction ([place]) 02/23/2017   Non-stemi  . Osteoarthritis    "severe in neck, lower back, hands" (02/24/2017)  . Sinus headache    Past Surgical History:  Procedure Laterality Date  . BASAL CELL CARCINOMA EXCISION     w/skin graft to my nose  . BREAST DUCTAL SYSTEM EXCISION Right 1991   milk duct  . CARPAL TUNNEL RELEASE Right 1996  . COLONOSCOPY    . CORONARY ANGIOPLASTY WITH STENT PLACEMENT  02/24/2017   Ramus lesion, 60 %stenosed, A STENT RESOLUTE ONYX 2.5X26 drug eluting stent was successfully placed.Ost Ramus lesion, 100 %stenosed.Post intervention, there is a  0% residual stenosis.  . CORONARY STENT INTERVENTION N/A 02/24/2017   Procedure: Coronary Stent Intervention;  Surgeon: Troy Sine, MD;  Location: [place] [facility];  Service: Cardiovascular;  Laterality: N/A;  ramus  . CYST EXCISION Left 2002   arthritic cysts on index finger  . CYSTOSCOPY W/ STONE MANIPULATION Left 04/2006   kidney stone on the left obtained  . CYSTOSCOPY/URETEROSCOPY/HOLMIUM LASER/STENT PLACEMENT Right 03/16/2018   Procedure:  CYSTOSCOPY/RETROGRADE/URETEROSCOPY/HOLMIUM LASER/STENT PLACEMENT;  Surgeon: Kathie Rhodes, MD;  Location: [facility];  Service: Urology;  Laterality: Right;  . CYSTOSCOPY/URETEROSCOPY/HOLMIUM LASER/STENT PLACEMENT Left 07/20/2018   Procedure: CYSTOSCOPY/RETROGRADE/URETEROSCOPY/HOLMIUM LASER/STENT PLACEMENT;  Surgeon: Kathie Rhodes, MD;  Location: [facility];  Service: Urology;  Laterality: Left;  . DILATION AND CURETTAGE OF UTERUS    . EXTRACORPOREAL SHOCK WAVE LITHOTRIPSY  03/2014   Archie Endo 03/10/2014  . EXTRACORPOREAL SHOCK WAVE LITHOTRIPSY Left 10/05/2017   Procedure: LEFT EXTRACORPOREAL SHOCK WAVE LITHOTRIPSY (ESWL);  Surgeon: Kathie Rhodes, MD;  Location: [facility];  Service: Urology;  Laterality: Left;  . LEFT HEART CATH AND CORONARY ANGIOGRAPHY N/A 02/24/2017   Procedure: Left Heart Cath and Coronary Angiography;  Surgeon: Troy Sine, MD;  Location: [place] [facility];  Service: Cardiovascular;  Laterality: N/A;  . VAGINAL HYSTERECTOMY    . WISDOM TOOTH EXTRACTION       No outpatient medications have been marked as taking for the 03/26/19 encounter (Appointment) with Sanda Klein, MD.   Current Facility-Administered Medications for the 03/26/19 encounter (Appointment) with Sanda Klein, MD  Medication  . methylPREDNISolone acetate (DEPO-MEDROL) injection 40 mg     Allergies:   Altace [ramipril]; Benicar [olmesartan]; Flomax [tamsulosin hcl]; and Sulfa antibiotics   Social History   Tobacco Use  . Smoking status: Never Smoker  . Smokeless tobacco: Never Used  Substance Use Topics  . Alcohol use: No  . Drug use: No     Family Hx: The patient's family history includes Heart attack in her father and paternal grandmother.  ROS:   Please see the history of present illness.     All other systems reviewed and are negative.   Prior CV studies:   The following studies were reviewed today:  Most recent labs from PCP, Dr. Dagmar Hait  Labs/Other Tests and Data Reviewed:     EKG:  An ECG dated 02/26/2018 was personally reviewed today and demonstrated:  Sinus bradycardia, otherwise normal tracing  Recent Labs: 07/20/2018: BUN 23; Creatinine, Ser 1.00; Hemoglobin 14.3; Potassium 3.7; Sodium 144   Recent Lipid Panel Lab Results  Component Value Date/Time   CHOL 148 03/05/2018 08:10 AM   TRIG 204 (H) 03/05/2018 08:10 AM   HDL 43 03/05/2018 08:10 AM   CHOLHDL 3.4 03/05/2018 08:10 AM   CHOLHDL 4.8 02/24/2017 03:24 AM   LDLCALC 64 03/05/2018 08:10 AM    Wt Readings from Last 3 Encounters:  07/20/18 193 lb 9.6 oz (87.8 kg)  03/16/18 205 lb (93 kg)  03/12/18 205 lb 6 oz (93.2 kg)     Objective:    Vital Signs:  There were no vitals taken for this visit.   VITAL SIGNS:  reviewed GEN:  no acute distress EYES:  sclerae anicteric, EOMI - Extraocular Movements Intact RESPIRATORY:  normal respiratory effort, symmetric expansion CARDIOVASCULAR:  no peripheral edema SKIN:  no rash, lesions or ulcers. MUSCULOSKELETAL:  no obvious deformities. NEURO:  alert and oriented x 3, no obvious focal deficit PSYCH:  normal affect  ASSESSMENT & PLAN:     1.  CAD s/p NSTEMI s/p DES-ramus: Remains angina free despite an active lifestyle.   On aspirin, statin, beta-blocker 2. HTN:  Well-controlled 3. HLP:  All lipid parameters at range after the last dose change.  Continue statin.  Has lab scheduled in the next several weeks with Dr. Dagmar Hait.  COVID-19 Education: The signs and symptoms of COVID-19 were discussed with the patient and how to seek care for testing (follow up with PCP or arrange E-visit).  The importance of social distancing was discussed today.  Time:   Today, I have spent 15 minutes with the patient with telehealth technology discussing the above problems.     Medication Adjustments/Labs and Tests Ordered: Current medicines are reviewed at length with the patient today.  Concerns regarding medicines are outlined above.   Tests Ordered: No orders of  the defined types were placed in this encounter.   Medication Changes: No orders of the defined types were placed in this encounter.   Disposition:  Follow up 12 months  Signed, Sanda Klein, MD  03/26/2019 4:01 PM    [facility]

## 2019-03-26 NOTE — Patient Instructions (Signed)
Medication Instructions:  Continue same medications If you need a refill on your cardiac medications before your next appointment, please call your pharmacy.   Lab work: None ordered  Testing/Procedures: None ordered  Follow-Up: At [facility], you and your health needs are our prority.  As part of our continuing mission to provide you with exceptional heart care, we have created designated Provider Care Teams.  These Care Teams include your primary Cardiologist (physician) and Advanced Practice Providers (APPs -  Physician Assistants and Nurse Practitioners) who all work together to provide you with the care you need, when you need it. . Schedule follow up appointment with Dr.Croitoru in 12 months   Call 3 months before to schedule

## 2019-03-26 NOTE — Telephone Encounter (Signed)
Virtual Visit Pre-Appointment Phone Call  Steps For Call:  1. Confirm consent - "In the setting of the current Covid19 crisis, you are scheduled for a (phone or video) visit with your provider on (date) at (time).  Just as we do with many in-office visits, in order for you to participate in this visit, we must obtain consent.  If you'd like, I can send this to your mychart (if signed up) or email for you to review.  Otherwise, I can obtain your verbal consent now.  All virtual visits are billed to your insurance company just like a normal visit would be.  By agreeing to a virtual visit, we'd like you to understand that the technology does not allow for your provider to perform an examination, and thus may limit your provider's ability to fully assess your condition. If your provider identifies any concerns that need to be evaluated in person, we will make arrangements to do so.  Finally, though the technology is pretty good, we cannot assure that it will always work on either your or our end, and in the setting of a video visit, we may have to convert it to a phone-only visit.  In either situation, we cannot ensure that we have a secure connection.  Are you willing to proceed?" STAFF: Did the patient verbally acknowledge consent to telehealth visit? Document YES/NO here: YES  2. Confirm the BEST phone number to call the day of the visit by including in appointment notes  3. Give patient instructions for MyChart download to smartphone OR [email or web address] as below if video visit (depending on what platform provider is using)  4. Confirm that appointment type is correct in Epic appointment notes (VIDEO vs PHONE)  5. Advise patient to be prepared with their blood pressure, heart rate, weight, any heart rhythm information, their current medicines, and a piece of paper and pen handy for any instructions they may receive the day of their visit  6. Inform patient they will receive a phone call 15 minutes  prior to their appointment time (may be from unknown caller ID) so they should be prepared to answer    TELEPHONE CALL NOTE  [name] has been deemed a candidate for a follow-up tele-health visit to limit community exposure during the Covid-19 pandemic. I spoke with the patient via phone to ensure availability of phone/video source, confirm preferred email & phone number, and discuss instructions and expectations.  I reminded [name] [name] to be prepared with any vital sign and/or heart rhythm information that could potentially be obtained via home monitoring, at the time of her visit. I reminded [name] to expect a phone call prior to her visit.  [name], [place] 03/26/2019 9:07 AM   INSTRUCTIONS FOR DOWNLOADING THE MYCHART APP TO SMARTPHONE  - The patient must first make sure to have activated MyChart and know their login information - If Apple, go to [employer] and type in MyChart in the search bar and download the app. If Android, ask patient to go to [employer] and type in [place] in the search bar and download the app. The app is free but as with any other app downloads, their phone may require them to verify saved payment information or Apple/Android password.  - The patient will need to then log into the app with their MyChart username and password, and select [facility] as their healthcare provider to link the account. When it is time for your  visit, go to the MyChart app, find appointments, and click Begin Video Visit. Be sure to Select Allow for your device to access the Microphone and Camera for your visit. You will then be connected, and your provider will be with you shortly.  **If they have any issues connecting, or need assistance please contact MyChart service desk [phone])**  **If using a computer, in order to ensure the best quality for their visit they will need to use either of the following Internet Browsers: [employer], or Google Chrome**  IF USING DOXIMITY or DOXY.ME - The patient will receive a link just prior to their visit by text.     FULL LENGTH CONSENT FOR TELE-HEALTH VISIT   I hereby voluntarily request, consent and authorize [facility] and its employed or contracted physicians, physician assistants, nurse practitioners or other licensed health care professionals (the Practitioner), to provide me with telemedicine health care services (the "Services") as deemed necessary by the treating Practitioner. I acknowledge and consent to receive the Services by the Practitioner via telemedicine. I understand that the telemedicine visit will involve communicating with the Practitioner through live audiovisual communication technology and the disclosure of certain medical information by electronic transmission. I acknowledge that I have been given the opportunity to request an in-person assessment or other available alternative prior to the telemedicine visit and am voluntarily participating in the telemedicine visit.  I understand that I have the right to withhold or withdraw my consent to the use of telemedicine in the course of my care at any time, without affecting my right to future care or treatment, and that the Practitioner or I may terminate the telemedicine visit at any time. I understand that I have the right to inspect all information obtained and/or recorded in the course of the telemedicine visit and may receive copies of available information for a reasonable fee.  I understand that some of the potential risks of receiving the Services via telemedicine include:  Marland Kitchen Delay or interruption in medical evaluation due to technological equipment failure or disruption; . Information transmitted may not be sufficient (e.g. poor resolution of images) to allow for appropriate medical decision making by the Practitioner; and/or  . In rare instances, security protocols could fail, causing a breach of personal  health information.  Furthermore, I acknowledge that it is my responsibility to provide information about my medical history, conditions and care that is complete and accurate to the best of my ability. I acknowledge that Practitioner's advice, recommendations, and/or decision may be based on factors not within their control, such as incomplete or inaccurate data provided by me or distortions of diagnostic images or specimens that may result from electronic transmissions. I understand that the practice of medicine is not an exact science and that Practitioner makes no warranties or guarantees regarding treatment outcomes. I acknowledge that I will receive a copy of this consent concurrently upon execution via email to the email address I last provided but may also request a printed copy by calling the office of [facility].    I understand that my insurance will be billed for this visit.   I have read or had this consent read to me. . I understand the contents of this consent, which adequately explains the benefits and risks of the Services being provided via telemedicine.  . I have been provided ample opportunity to ask questions regarding this consent and the Services and have had my questions answered to my satisfaction. . I give my  informed consent for the services to be provided through the use of telemedicine in my medical care  By participating in this telemedicine visit I agree to the above.        Cardiac Questionnaire:    Since your last visit or hospitalization:    1. Have you been having new or worsening chest pain? NO   2. Have you been having new or worsening shortness of breath? YES 3. Have you been having new or worsening leg swelling, wt gain, or increase in abdominal girth (pants fitting more tightly)? NO   4. Have you had any passing out spells? NO    *A YES to any of these questions would result in the appointment being kept. *If all the answers to these questions  are NO, we should indicate that given the current situation regarding the worldwide coronarvirus pandemic, at the recommendation of the CDC, we are looking to limit gatherings in our waiting area, and thus will reschedule their appointment beyond four weeks from today.   _____________   [id number] Pre-Screening Questions:  . Do you currently have a fever? NO . Have you recently travelled on a cruise, internationally, or to [place], [place], [place], [place], [place], or [place], [place] [employer])? NO . Have you been in contact with someone that is currently pending confirmation of Covid19 testing or has been confirmed to have the [place] virus? NO . Are you currently experiencing fatigue or cough? NO

## 2019-04-05 ENCOUNTER — Other Ambulatory Visit: Payer: Self-pay | Admitting: Cardiovascular Disease

## 2019-04-05 NOTE — Telephone Encounter (Signed)
Pantoprazole 40 mg refilled. 

## 2019-04-30 ENCOUNTER — Other Ambulatory Visit: Payer: Self-pay | Admitting: Cardiovascular Disease

## 2019-05-13 ENCOUNTER — Ambulatory Visit
Admission: RE | Admit: 2019-05-13 | Discharge: 2019-05-13 | Disposition: A | Discharge status: Home / Self Care | Payer: PPO | Source: Ambulatory Visit | Attending: Orthopaedic Surgery | Admitting: Orthopaedic Surgery

## 2019-05-13 ENCOUNTER — Other Ambulatory Visit: Payer: Self-pay

## 2019-05-13 DIAGNOSIS — I10 Essential (primary) hypertension: Secondary | ICD-10-CM | POA: Diagnosis not present

## 2019-05-13 DIAGNOSIS — M23221 Derangement of posterior horn of medial meniscus due to old tear or injury, right knee: Secondary | ICD-10-CM | POA: Diagnosis not present

## 2019-05-13 DIAGNOSIS — M948X6 Other specified disorders of cartilage, lower leg: Secondary | ICD-10-CM | POA: Diagnosis not present

## 2019-05-13 DIAGNOSIS — G8929 Other chronic pain: Secondary | ICD-10-CM

## 2019-05-15 ENCOUNTER — Encounter: Payer: Self-pay | Admitting: Orthopaedic Surgery

## 2019-05-15 ENCOUNTER — Ambulatory Visit (INDEPENDENT_AMBULATORY_CARE_PROVIDER_SITE_OTHER): Payer: PPO | Admitting: Orthopaedic Surgery

## 2019-05-15 ENCOUNTER — Other Ambulatory Visit: Payer: Self-pay

## 2019-05-15 DIAGNOSIS — S83206A Unspecified tear of unspecified meniscus, current injury, right knee, initial encounter: Secondary | ICD-10-CM | POA: Diagnosis not present

## 2019-05-15 DIAGNOSIS — S83203D Other tear of unspecified meniscus, current injury, right knee, subsequent encounter: Secondary | ICD-10-CM | POA: Diagnosis not present

## 2019-05-15 DIAGNOSIS — M1711 Unilateral primary osteoarthritis, right knee: Secondary | ICD-10-CM | POA: Diagnosis not present

## 2019-05-15 NOTE — Progress Notes (Signed)
The patient is a very pleasant and active 68 year old female with an acute right knee injury.  She twisted this knee back in October and since then has had recurrent effusions of the knee.  We have aspirated and placed a steroid on multiple occasions.  We finally sent her for an MRI given her plain film showing still a well-maintained joint space.  It mainly hurts on the medial joint line of her right knee.  She still has recurrent effusions and still has locking and catching with that right knee.  On examination she has medial joint line tenderness with a positive Murray sign the medial side of her knee.  Her knee feels ligamentously stable.  There is a large joint effusion.  MRI is reviewed and does show a complex posterior horn mid body medial meniscal tear with a flap component.  There is reactive edema in the subchondral bone on the femur.  There is areas of partial to full-thickness cartilage loss in the medial compartment.  At this point she does wish to proceed with an arthroscopic intervention of her knee given the mechanical symptoms.  I do feel this is worth trying with performing a partial medial meniscectomy and then in the postoperative period placing hyaluronic acid in her knee.  Given her locking catching and continued mechanical symptoms combined with the MRI findings and the failure of conservative treatment arthroscopic surgery at this point with her right knee is warranted.  All question concerns were answered and addressed.  We talked about the risk and benefits of surgery as well as the potential that she may need a knee replacement in the future.  She does wish to proceed with surgery soon.  She works as a Engineer, maintenance.  We talked about what her intraoperative and postoperative course would involve.

## 2019-05-16 ENCOUNTER — Other Ambulatory Visit: Payer: Self-pay | Admitting: Cardiovascular Disease

## 2019-05-20 DIAGNOSIS — I129 Hypertensive chronic kidney disease with stage 1 through stage 4 chronic kidney disease, or unspecified chronic kidney disease: Secondary | ICD-10-CM | POA: Diagnosis not present

## 2019-05-20 DIAGNOSIS — N183 Chronic kidney disease, stage 3 (moderate): Secondary | ICD-10-CM | POA: Diagnosis not present

## 2019-05-20 DIAGNOSIS — E785 Hyperlipidemia, unspecified: Secondary | ICD-10-CM | POA: Diagnosis not present

## 2019-05-20 DIAGNOSIS — I213 ST elevation (STEMI) myocardial infarction of unspecified site: Secondary | ICD-10-CM | POA: Diagnosis not present

## 2019-05-20 DIAGNOSIS — M1990 Unspecified osteoarthritis, unspecified site: Secondary | ICD-10-CM | POA: Diagnosis not present

## 2019-05-20 DIAGNOSIS — K219 Gastro-esophageal reflux disease without esophagitis: Secondary | ICD-10-CM | POA: Diagnosis not present

## 2019-06-06 ENCOUNTER — Other Ambulatory Visit: Payer: Self-pay | Admitting: Orthopaedic Surgery

## 2019-06-06 DIAGNOSIS — M659 Synovitis and tenosynovitis, unspecified: Secondary | ICD-10-CM | POA: Diagnosis not present

## 2019-06-06 DIAGNOSIS — M23221 Derangement of posterior horn of medial meniscus due to old tear or injury, right knee: Secondary | ICD-10-CM | POA: Diagnosis not present

## 2019-06-06 MED ORDER — HYDROCODONE-ACETAMINOPHEN 10-325 MG PO TABS: 1.0000 | ORAL_TABLET | Freq: Four times a day (QID) | ORAL | 0 refills | Status: DC | PRN

## 2019-06-13 ENCOUNTER — Ambulatory Visit (INDEPENDENT_AMBULATORY_CARE_PROVIDER_SITE_OTHER): Payer: PPO | Admitting: Orthopaedic Surgery

## 2019-06-13 ENCOUNTER — Other Ambulatory Visit: Payer: Self-pay

## 2019-06-13 ENCOUNTER — Telehealth: Payer: Self-pay

## 2019-06-13 ENCOUNTER — Encounter: Payer: Self-pay | Admitting: Orthopaedic Surgery

## 2019-06-13 DIAGNOSIS — M1711 Unilateral primary osteoarthritis, right knee: Secondary | ICD-10-CM

## 2019-06-13 DIAGNOSIS — Z9889 Other specified postprocedural states: Secondary | ICD-10-CM

## 2019-06-13 NOTE — Progress Notes (Signed)
The patient is one-week status post a right knee arthroscopy with a partial medial meniscectomy.  We did find a small area of full-thickness cartilage loss in the weightbearing surface of the knee and we debrided this back to a stable margin.  This is in the medial compartment.  Her lateral compartment looked good as did her patellofemoral joint.  Her ACL and PCL were also intact.  The lateral meniscus was intact.  It was a large medial meniscal tear.  She is doing well overall.  She reports only some mild swelling and is still walking with a slight limp.  On exam I did remove the sutures from her right knee without difficulty.  She does have just a mild effusion with good range of motion of her knee.  We did go over arthroscopy pictures in detail and talked about her postoperative course.  Given the small area of full-thickness cartilage loss in her knee I do feel that she is a candidate for hyaluronic acid for long-term treatment of the osteoarthritis of her knee.  I believe that we should order this injection and told her why as well as explained the risk and benefits of hyaluronic acid injections.  I would like to see her back in 6 weeks to hopefully place a hyaluronic acid injection in her right knee.  All questions concerns were answered addressed.  I will allow her to return to work on 06/29/2019.

## 2019-06-13 NOTE — Telephone Encounter (Signed)
Right knee gel injection  

## 2019-06-14 ENCOUNTER — Telehealth: Payer: Self-pay

## 2019-06-14 NOTE — Telephone Encounter (Signed)
Noted  

## 2019-06-14 NOTE — Telephone Encounter (Signed)
Submitted VOB for Monovisc, right knee. 

## 2019-06-16 IMAGING — MR MRI OF THE RIGHT KNEE WITHOUT CONTRAST
4 of 6 series · 18 of 40 positions shown · non-contrast
Comparison: None.

CLINICAL DATA: Right knee 10 months.  Medial and lateral pain.

EXAM:
MRI OF THE RIGHT KNEE WITHOUT CONTRAST
TECHNIQUE: Multiplanar, multisequence MR imaging of the knee was performed. No
intravenous contrast was administered.

[Series 3: T2 fat-sat · axial · 4.0mm · 0.31mm/px · z∈[-63,+16]mm · 3 of 28 slices shown (1 of 2)]
[im 5/28]
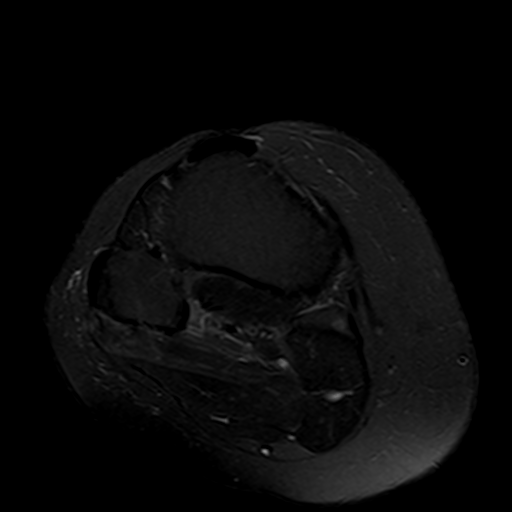
[im 14/28]
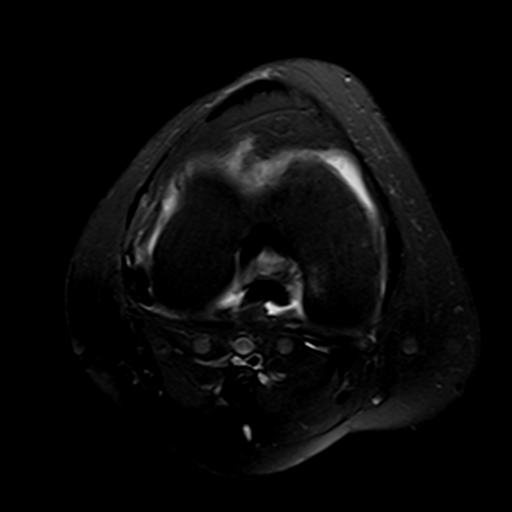
[im 23/28]
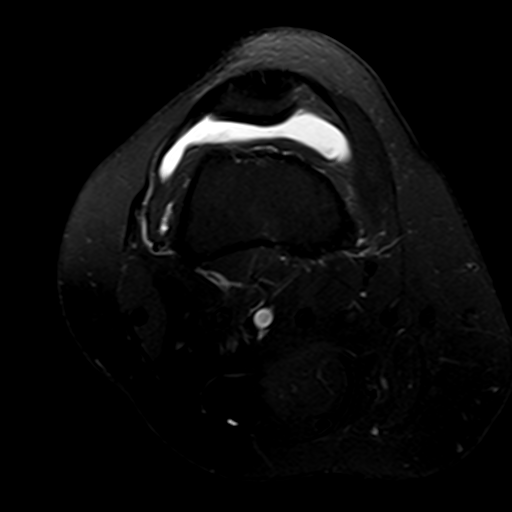

[Series 5: T2 fat-sat · coronal · 4.0mm · 0.29mm/px · 3 of 28 slices shown (2 of 2)]
[im 6/28]
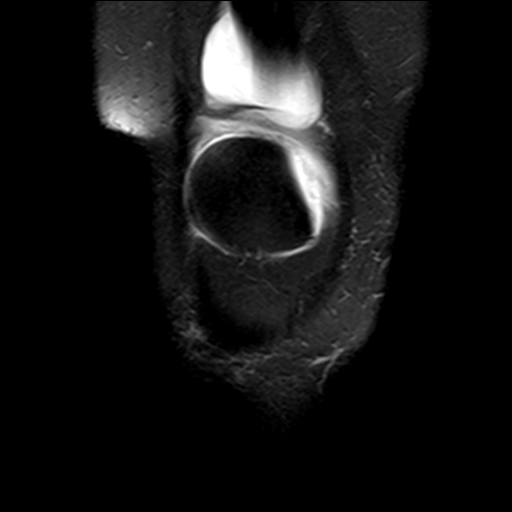
[im 17/28]
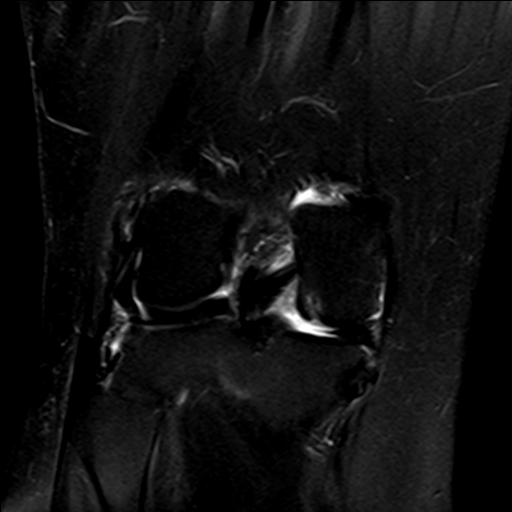
[im 28/28]
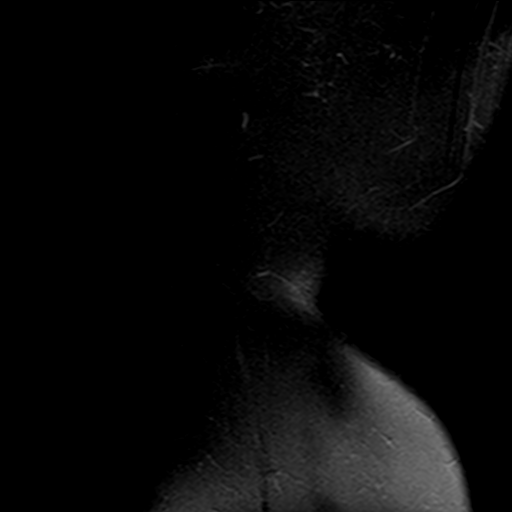

[Series 6: PD fat-sat · coronal · 3.0mm · 0.29mm/px · 8 of 34 slices shown (1 of 2)]
[im 1/34]
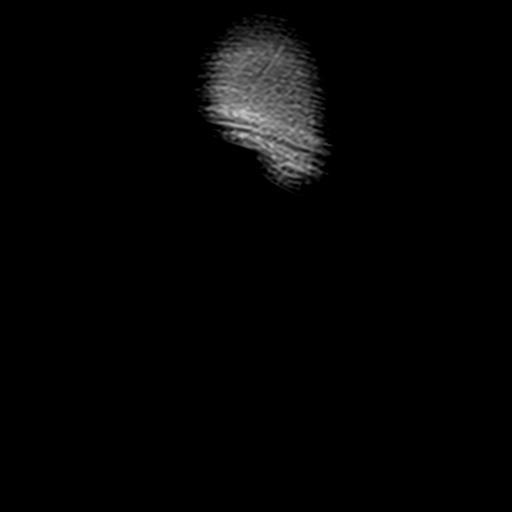
[im 5/34]
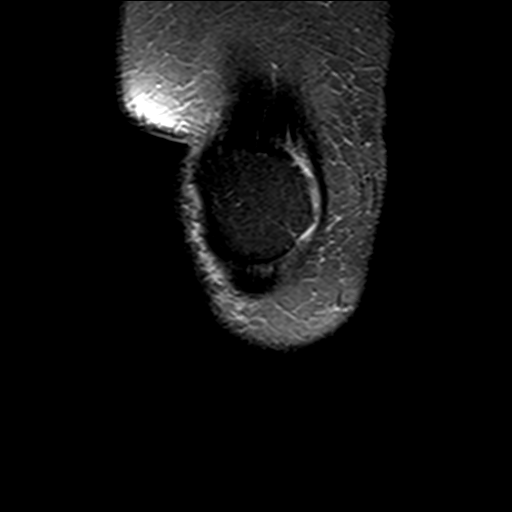
[im 10/34]
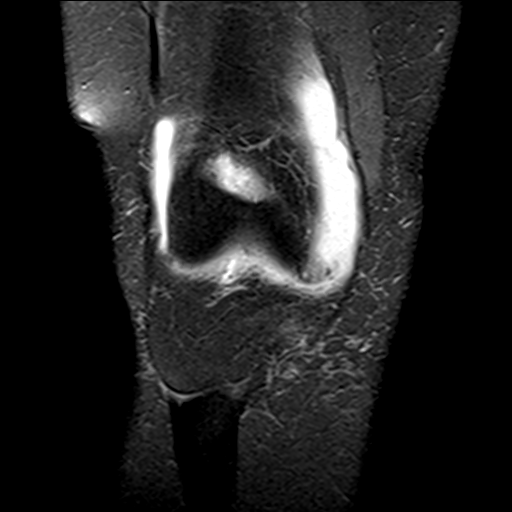
[im 15/34]
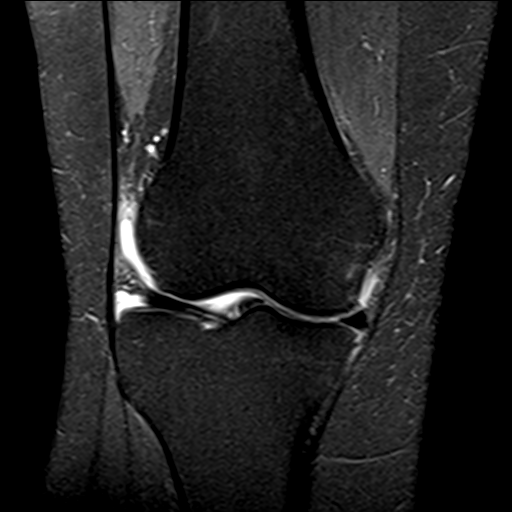
[im 19/34]
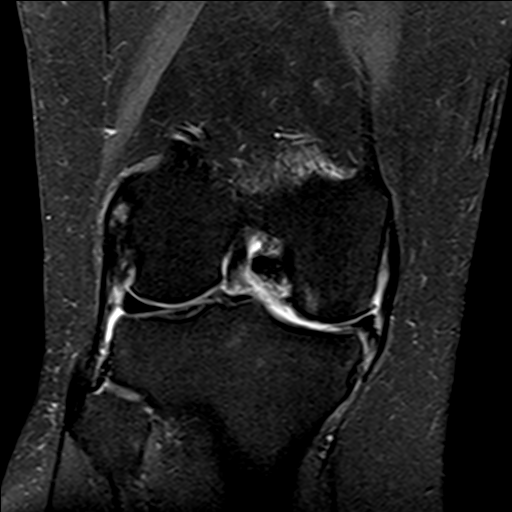
[im 24/34]
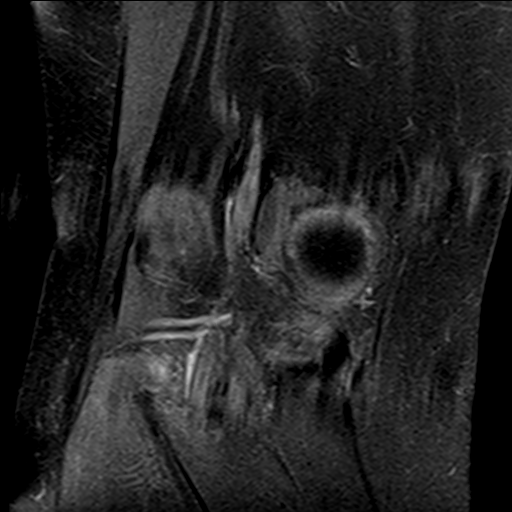
[im 29/34]
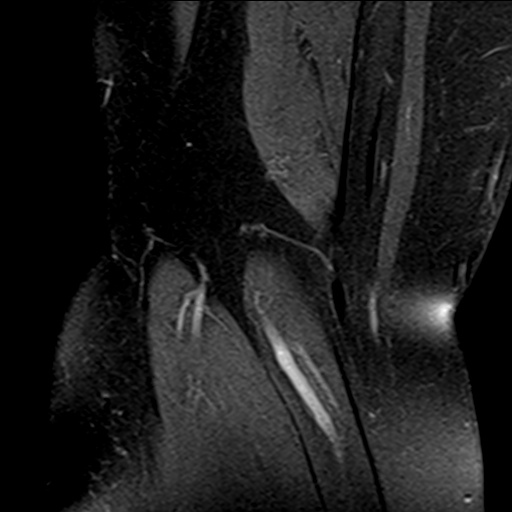
[im 34/34]
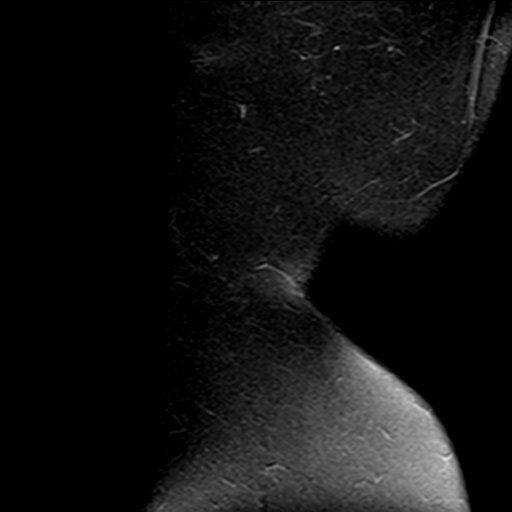

[Series 7: PD fat-sat · sagittal · 3.0mm · 0.29mm/px · 4 of 28 slices shown (2 of 2)]
[im 1/28]
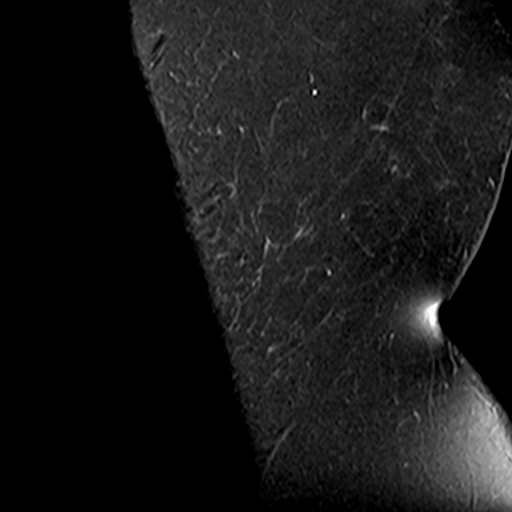
[im 6/28]
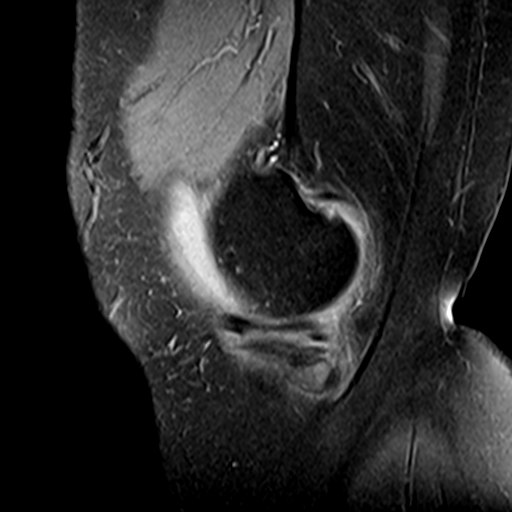
[im 17/28]
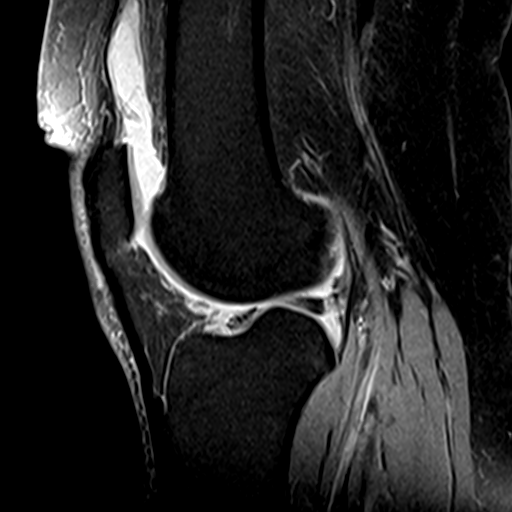
[im 28/28]
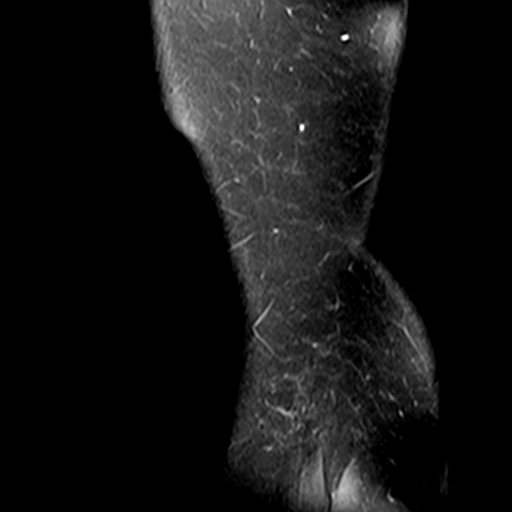

[18 of 40 positions shown; findings below may reference images not displayed]

FINDINGS: MENISCI

Medial meniscus: Large radial tear of the posterior horn of the
medial meniscus adjacent to the meniscal root with peripheral
meniscal extrusion. Oblique tear of the posterior horn-body junction
of the medial meniscus extending to the inferior articular surface.

Lateral meniscus:  Intact.

LIGAMENTS

Cruciates:  Intact ACL and PCL.

Collaterals: Medial collateral ligament is intact. Lateral
collateral ligament complex is intact.

CARTILAGE

Patellofemoral:  No chondral defect.

Medial: High-grade partial-thickness cartilage loss with areas of
full-thickness cartilage loss of the medial femorotibial compartment
with subchondral reactive marrow edema in the posterior
weight-bearing surface of the medial femoral condyle.

Lateral:  No chondral defect.

Joint: Large joint effusion. Normal Hoffa's fat. No plical
thickening

Popliteal Fossa:  No Baker cyst. Intact popliteus tendon.

Extensor Mechanism: Intact quadriceps tendon. Intact patellar
tendon. Intact medial patellar retinaculum. Intact lateral patellar
retinaculum. Intact MPFL.

Bones:  No acute osseous abnormality.  No aggressive osseous lesion.

Other: No fluid collection or hematoma.  Muscles are normal.
IMPRESSION: 1. Large radial tear of the posterior horn of the medial meniscus
adjacent to the meniscal root with peripheral meniscal extrusion.
Oblique tear of the posterior horn-body junction of the medial
meniscus extending to the inferior articular surface.
2. High-grade partial-thickness cartilage loss with areas of
full-thickness cartilage loss of the medial femorotibial compartment
with subchondral reactive marrow edema in the posterior
weight-bearing surface of the medial femoral condyle.

## 2019-06-17 DIAGNOSIS — D1801 Hemangioma of skin and subcutaneous tissue: Secondary | ICD-10-CM | POA: Diagnosis not present

## 2019-06-17 DIAGNOSIS — C44519 Basal cell carcinoma of skin of other part of trunk: Secondary | ICD-10-CM | POA: Diagnosis not present

## 2019-06-17 DIAGNOSIS — L281 Prurigo nodularis: Secondary | ICD-10-CM | POA: Diagnosis not present

## 2019-06-17 DIAGNOSIS — Z85828 Personal history of other malignant neoplasm of skin: Secondary | ICD-10-CM | POA: Diagnosis not present

## 2019-06-17 DIAGNOSIS — L821 Other seborrheic keratosis: Secondary | ICD-10-CM | POA: Diagnosis not present

## 2019-06-17 DIAGNOSIS — L308 Other specified dermatitis: Secondary | ICD-10-CM | POA: Diagnosis not present

## 2019-06-17 DIAGNOSIS — D485 Neoplasm of uncertain behavior of skin: Secondary | ICD-10-CM | POA: Diagnosis not present

## 2019-07-01 ENCOUNTER — Other Ambulatory Visit: Payer: Self-pay | Admitting: Cardiovascular Disease

## 2019-07-04 ENCOUNTER — Other Ambulatory Visit: Payer: Self-pay | Admitting: Cardiovascular Disease

## 2019-07-08 DIAGNOSIS — C44519 Basal cell carcinoma of skin of other part of trunk: Secondary | ICD-10-CM | POA: Diagnosis not present

## 2019-07-08 DIAGNOSIS — Z85828 Personal history of other malignant neoplasm of skin: Secondary | ICD-10-CM | POA: Diagnosis not present

## 2019-07-11 ENCOUNTER — Telehealth: Payer: Self-pay

## 2019-07-11 NOTE — Telephone Encounter (Signed)
Approved for Monovisc, right knee. [place] Patient will be responsible for 20% OOP. May have a $30.00 Co-pay No PA required  Appt. 07/22/2019 with Dr. Ninfa Linden

## 2019-07-22 ENCOUNTER — Ambulatory Visit (INDEPENDENT_AMBULATORY_CARE_PROVIDER_SITE_OTHER): Payer: PPO | Admitting: Orthopaedic Surgery

## 2019-07-22 ENCOUNTER — Encounter: Payer: Self-pay | Admitting: Orthopaedic Surgery

## 2019-07-22 DIAGNOSIS — M1711 Unilateral primary osteoarthritis, right knee: Secondary | ICD-10-CM

## 2019-07-22 DIAGNOSIS — Z9889 Other specified postprocedural states: Secondary | ICD-10-CM

## 2019-07-22 MED ORDER — HYALURONAN 88 MG/4ML IX SOSY
88.0000 mg | PREFILLED_SYRINGE | INTRA_ARTICULAR | Status: AC | PRN
  Administered 2019-07-22: 88 mg via INTRA_ARTICULAR

## 2019-07-22 NOTE — Progress Notes (Signed)
   Procedure Note  Patient: [name] [name]             Date of Birth: Sep 07, 1951           MRN: [id number]             Visit Date: 07/22/2019  Procedures: Visit Diagnoses:  1. Status post arthroscopy of right knee   2. Unilateral primary osteoarthritis, right knee     Large Joint Inj: R knee on 07/22/2019 10:22 AM Indications: diagnostic evaluation and pain Details: 22 G 1.5 in needle, superolateral approach  Arthrogram: No  Medications: 88 mg Hyaluronan 88 MG/4ML Outcome: tolerated well, no immediate complications Procedure, treatment alternatives, risks and benefits explained, specific risks discussed. Consent was given by the patient. Immediately prior to procedure a time out was called to verify the correct patient, procedure, equipment, support staff and site/side marked as required. Patient was prepped and draped in the usual sterile fashion.    The patient is here today for scheduled hyaluronic acid injection with Monovisc in her right knee to treat the pain from osteoarthritis.  She is tried and failed other forms conservative treatment of the right knee.  She is even had arthroscopic intervention.  Her pain is daily and it is detriment affecting her mobility, her quality of life, and her actives daily living.  On examination of her right knee she does have slight varus malalignment.  She has global tenderness but no effusion today.  Her knee moves fully.  She tolerated the hyaluronic acid injection of Monovisc in her right knee.  All question concerns were answered and addressed.  Follow-up will be as needed.

## 2019-09-12 ENCOUNTER — Other Ambulatory Visit: Payer: Self-pay | Admitting: Cardiovascular Disease

## 2019-09-26 ENCOUNTER — Other Ambulatory Visit: Payer: Self-pay | Admitting: Cardiovascular Disease

## 2019-09-29 ENCOUNTER — Other Ambulatory Visit: Payer: Self-pay | Admitting: Cardiovascular Disease

## 2019-11-18 ENCOUNTER — Other Ambulatory Visit: Payer: Self-pay

## 2019-11-18 DIAGNOSIS — E7849 Other hyperlipidemia: Secondary | ICD-10-CM | POA: Diagnosis not present

## 2019-11-18 DIAGNOSIS — Z20822 Contact with and (suspected) exposure to covid-19: Secondary | ICD-10-CM

## 2019-11-20 LAB — NOVEL CORONAVIRUS, NAA: SARS-CoV-2, NAA: DETECTED — AB

## 2019-11-21 ENCOUNTER — Telehealth: Payer: Self-pay | Admitting: Unknown Physician Specialty

## 2019-11-21 NOTE — Telephone Encounter (Signed)
Called to discuss with patient about Covid symptoms and the use of bamlanivimab, a monoclonal antibody infusion for those with mild to moderate Covid symptoms and at a high risk of hospitalization.  Pt is qualified for this infusion at the [facility] infusion center due to Age > 39 and BMI>35   Message left to call back

## 2019-11-25 DIAGNOSIS — M5380 Other specified dorsopathies, site unspecified: Secondary | ICD-10-CM | POA: Diagnosis not present

## 2019-11-25 DIAGNOSIS — I213 ST elevation (STEMI) myocardial infarction of unspecified site: Secondary | ICD-10-CM | POA: Diagnosis not present

## 2019-11-25 DIAGNOSIS — Z0000 Encounter for general adult medical examination without abnormal findings: Secondary | ICD-10-CM | POA: Diagnosis not present

## 2019-11-25 DIAGNOSIS — M1990 Unspecified osteoarthritis, unspecified site: Secondary | ICD-10-CM | POA: Diagnosis not present

## 2019-11-25 DIAGNOSIS — K219 Gastro-esophageal reflux disease without esophagitis: Secondary | ICD-10-CM | POA: Diagnosis not present

## 2019-11-25 DIAGNOSIS — I129 Hypertensive chronic kidney disease with stage 1 through stage 4 chronic kidney disease, or unspecified chronic kidney disease: Secondary | ICD-10-CM | POA: Diagnosis not present

## 2019-11-25 DIAGNOSIS — D72819 Decreased white blood cell count, unspecified: Secondary | ICD-10-CM | POA: Diagnosis not present

## 2019-11-25 DIAGNOSIS — E785 Hyperlipidemia, unspecified: Secondary | ICD-10-CM | POA: Diagnosis not present

## 2019-11-25 DIAGNOSIS — K635 Polyp of colon: Secondary | ICD-10-CM | POA: Diagnosis not present

## 2019-11-25 DIAGNOSIS — J302 Other seasonal allergic rhinitis: Secondary | ICD-10-CM | POA: Diagnosis not present

## 2019-11-25 DIAGNOSIS — N200 Calculus of kidney: Secondary | ICD-10-CM | POA: Diagnosis not present

## 2019-11-25 DIAGNOSIS — N1831 Chronic kidney disease, stage 3a: Secondary | ICD-10-CM | POA: Diagnosis not present

## 2019-11-26 ENCOUNTER — Other Ambulatory Visit: Payer: Self-pay | Admitting: Cardiovascular Disease

## 2020-01-01 ENCOUNTER — Other Ambulatory Visit: Payer: Self-pay | Admitting: Cardiovascular Disease

## 2020-01-01 NOTE — Telephone Encounter (Signed)
Rx request sent to pharmacy.  

## 2020-01-08 ENCOUNTER — Other Ambulatory Visit: Payer: Self-pay | Admitting: Cardiovascular Disease

## 2020-03-24 ENCOUNTER — Ambulatory Visit: Payer: PPO | Admitting: Cardiovascular Disease

## 2020-03-26 ENCOUNTER — Ambulatory Visit: Payer: PPO | Admitting: Medical

## 2020-03-26 ENCOUNTER — Other Ambulatory Visit: Payer: Self-pay | Admitting: Cardiovascular Disease

## 2020-03-26 ENCOUNTER — Other Ambulatory Visit: Payer: Self-pay

## 2020-03-26 ENCOUNTER — Encounter: Payer: Self-pay | Admitting: Medical

## 2020-03-26 VITALS — BP 124/70 | HR 63 | Ht 64.0 in | Wt 198.0 lb

## 2020-03-26 DIAGNOSIS — E782 Mixed hyperlipidemia: Secondary | ICD-10-CM

## 2020-03-26 DIAGNOSIS — I10 Essential (primary) hypertension: Secondary | ICD-10-CM

## 2020-03-26 DIAGNOSIS — I2510 Atherosclerotic heart disease of native coronary artery without angina pectoris: Secondary | ICD-10-CM | POA: Diagnosis not present

## 2020-03-26 NOTE — Progress Notes (Signed)
Cardiology Office Note    Date:  03/26/2020   ID:  [name], [name] 1950-12-21, MRN [id number]  PCP:  Prince Solian, MD  Cardiologist:  Sanda Klein, MD  Electrophysiologist:  None   Chief Complaint: 1 year follow-up  History of Present Illness:   [name] [name] is a 69 y.o. female with history of HTN, HLD, strong family history of premature CAD with NSTMEI 02/2017 treated with DES to RI and EF 50% with improvement by echo in May 2018 showing EF 60-65%, and OA of right knee followed by ortho who presents for one year follow-up. The patient was last seen virtually by Dr. Sallyanne Kuster 03/25/20 and was stable from a cardiac standpoint. No changes were made.   The patient returns for routine follow-up. In July 2020 she underwent right meniscus repair and subsequent knee injections for lingering pain. She had COVID in December and symptoms were mild as patient thought she only had sinus congestion. Since then she has occasional sob. Denies chest pain, palpitations, and orthopnea. She does have intermittent lower leg edema that began after her knee surgery. 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 She has compression stockings but has not felt she has needed them. She plans to change routes so she is not driving/delivering as much which will also help her knee pain. She has had some weight gain which she attributes to COVID with loss of taste and smell but still has been able to taste sugary foods. She has general follow-up with PCP in May. She is taking her medications with no issues.   Past Medical History:  Diagnosis Date  . Basal cell carcinoma of nose    S/P cream, burn, cut off w/skin graft  . Chronic lower back pain   . Coronary artery disease   . Eczema   . GERD (gastroesophageal reflux disease)   . High cholesterol   . History of kidney stones   . Hypertension   . Myocardial infarction  ([place]) 02/23/2017   Non-stemi  . Osteoarthritis    "severe in neck, lower back, hands" (02/24/2017)  . Sinus headache     Past Surgical History:  Procedure Laterality Date  . BASAL CELL CARCINOMA EXCISION     w/skin graft to my nose  . BREAST DUCTAL SYSTEM EXCISION Right 1991   milk duct  . CARPAL TUNNEL RELEASE Right 1996  . COLONOSCOPY    . CORONARY ANGIOPLASTY WITH STENT PLACEMENT  02/24/2017   Ramus lesion, 60 %stenosed, A STENT RESOLUTE ONYX 2.5X26 drug eluting stent was successfully placed.Ost Ramus lesion, 100 %stenosed.Post intervention, there is a 0% residual stenosis.  . CORONARY STENT INTERVENTION N/A 02/24/2017   Procedure: Coronary Stent Intervention;  Surgeon: Troy Sine, MD;  Location: [facility];  Service: Cardiovascular;  Laterality: N/A;  ramus  . CYST EXCISION Left 2002   arthritic cysts on index finger  . CYSTOSCOPY W/ STONE MANIPULATION Left 04/2006   kidney stone on the left obtained  . CYSTOSCOPY/URETEROSCOPY/HOLMIUM LASER/STENT PLACEMENT Right 03/16/2018   Procedure: CYSTOSCOPY/RETROGRADE/URETEROSCOPY/HOLMIUM LASER/STENT PLACEMENT;  Surgeon: Kathie Rhodes, MD;  Location: [facility];  Service: Urology;  Laterality: Right;  . CYSTOSCOPY/URETEROSCOPY/HOLMIUM LASER/STENT PLACEMENT Left 07/20/2018   Procedure: CYSTOSCOPY/RETROGRADE/URETEROSCOPY/HOLMIUM LASER/STENT PLACEMENT;  Surgeon: Kathie Rhodes, MD;  Location: [facility];  Service: Urology;  Laterality: Left;  . DILATION AND CURETTAGE OF UTERUS    . EXTRACORPOREAL SHOCK WAVE LITHOTRIPSY  03/2014   Archie Endo 03/10/2014  . EXTRACORPOREAL SHOCK WAVE LITHOTRIPSY Left 10/05/2017   Procedure: LEFT EXTRACORPOREAL SHOCK WAVE LITHOTRIPSY (ESWL);  Surgeon: Kathie Rhodes, MD;  Location: [facility];  Service: Urology;  Laterality: Left;  . LEFT HEART CATH AND CORONARY ANGIOGRAPHY N/A 02/24/2017   Procedure: Left Heart Cath and Coronary Angiography;  Surgeon: Troy Sine, MD;  Location: [facility];   Service: Cardiovascular;  Laterality: N/A;  . VAGINAL HYSTERECTOMY    . WISDOM TOOTH EXTRACTION      Current Medications: Current Meds  Medication Sig  . aspirin 81 MG tablet Take 81 mg by mouth daily.  Marland Kitchen azelastine (ASTELIN) 137 MCG/SPRAY nasal spray Place 1 spray into the nose daily as needed for rhinitis or allergies. Use in each nostril as directed   . DULoxetine (CYMBALTA) 30 MG capsule Take 30 mg by mouth daily.   . fenofibrate 160 MG tablet Take 160 mg by mouth daily.  . Glucosamine-Chondroit-Vit C-Mn (GLUCOSAMINE 1500 COMPLEX) CAPS Take 1 capsule by mouth daily.  Marland Kitchen HYDROcodone-acetaminophen (NORCO) 10-325 MG tablet Take 1 tablet by mouth every 6 (six) hours as needed for moderate pain. Maximum dose per 24 hours - 8 pills  . irbesartan-hydrochlorothiazide (AVALIDE) 150-12.5 MG tablet TAKE 1 TABLET BY MOUTH EVERY DAY  . levocetirizine (XYZAL) 5 MG tablet Take 5 mg by mouth every evening.  . Magnesium 400 MG TABS Take 400 mg by mouth daily.  . metoprolol succinate (TOPROL-XL) 25 MG 24 hr tablet TAKE 1 TABLET BY MOUTH EVERY DAY  . Multiple Vitamin (MULTIVITAMIN WITH MINERALS) TABS tablet Take 1 tablet by mouth daily.  . nitroGLYCERIN (NITROSTAT) 0.4 MG SL tablet Place 1 tablet (0.4 mg total) under the tongue every 5 (five) minutes x 3 doses as needed for chest pain.  Marland Kitchen omega-3 acid ethyl esters (LOVAZA) 1 g capsule Take 2 capsules (2 g total) by mouth 2 (two) times daily.  . pantoprazole (PROTONIX) 40 MG tablet TAKE 1 TABLET (40 MG TOTAL) DAILY BY MOUTH.  . phenazopyridine (PYRIDIUM) 200 MG tablet Take 1 tablet (200 mg total) by mouth 3 (three) times daily as needed for pain.  Marland Kitchen Propylene Glycol (SYSTANE BALANCE OP) Place 1 drop into both eyes daily as needed (dry eyes).  . rosuvastatin (CRESTOR) 20 MG tablet Take 20 mg by mouth every morning.   . traMADol (ULTRAM) 50 MG tablet Take 50 mg by mouth every 12 (twelve) hours as needed for moderate pain.   Marland Kitchen triamcinolone ointment (KENALOG)  0.1 % Apply 1 application topically 2 (two) times daily as needed (eczema).   . [DISCONTINUED] ezetimibe (ZETIA) 10 MG tablet TAKE 1 TABLET (10 MG TOTAL) DAILY BY MOUTH.   Current Facility-Administered Medications for the 03/26/20 encounter (Office Visit) with Kathlen Mody, [place] Soy H, PA-C  Medication  . methylPREDNISolone acetate (DEPO-MEDROL) injection 40 mg    Allergies:   Altace [ramipril], Benicar [olmesartan], Flomax [tamsulosin hcl], and Sulfa antibiotics   Social History   Socioeconomic History  . Marital status: Married    Spouse name: Not on file  . Number of children: Not on file  . Years of education: Not on file  . Highest education level: Not on file  Occupational History  . Not on file  Tobacco Use  . Smoking status: Never Smoker  . Smokeless tobacco: Never Used  Substance and Sexual Activity  . Alcohol use: No  . Drug use: No  . Sexual activity: Yes    Birth control/protection: Surgical  Other Topics  Concern  . Not on file  Social History Narrative  . Not on file   Social Determinants of Health   Financial Resource Strain:   . Difficulty of Paying Living Expenses:   Food Insecurity:   . Worried About Charity fundraiser in the Last Year:   . Arboriculturist in the Last Year:   Transportation Needs:   . Film/video editor (Medical):   Marland Kitchen Lack of Transportation (Non-Medical):   Physical Activity:   . Days of Exercise per Week:   . Minutes of Exercise per Session:   Stress:   . Feeling of Stress :   Social Connections:   . Frequency of Communication with Friends and Family:   . Frequency of Social Gatherings with Friends and Family:   . Attends Religious Services:   . Active Member of Clubs or Organizations:   . Attends Archivist Meetings:   Marland Kitchen Marital Status:      Family History:  The patient's *family history includes Heart attack in her father and paternal grandmother.  ROS:   Please see the history of present illness.  All other  systems are reviewed and otherwise negative.    EKGs/Labs/Other Studies Reviewed:    Studies reviewed are outlined and summarized above. Reports included below if pertinent.  Echo 04/2017 - Left ventricle: The cavity size was normal. Systolic function was  normal. The estimated ejection fraction was in the range of 60%  to 65%. Wall motion was normal; there were no regional wall  motion abnormalities. The pulmonary vein flow pattern was normal.  Left ventricular diastolic function parameters were normal.  - Aortic valve: There was trivial regurgitation.  - Aortic root: The aortic root was normal in size.  - Mitral valve: Calcified annulus. Mildly thickened leaflets .  Transvalvular velocity was within the normal range. There was no  evidence for stenosis. There was mild regurgitation.  - Right ventricle: The cavity size was normal. Wall thickness was  normal. Systolic function was normal.  - Tricuspid valve: There was mild regurgitation.  - Pulmonary arteries: Systolic pressure was within the normal  range.  - Inferior vena cava: The vessel was normal in size.  - Pericardium, extracardiac: There was no pericardial effusion.   Cardiac Cath 02/2017  Ramus lesion, 60 %stenosed.  A STENT RESOLUTE ONYX 2.5X26 drug eluting stent was successfully placed.  Ost Ramus lesion, 100 %stenosed.  Post intervention, there is a 0% residual stenosis.   Acute coronary syndrome/NSTEMI secondary to total very proximal occlusion of the ramus intermediate vessel.  Normal LAD, left circumflex, and large dominant RCA.  Mild LV dysfunction with an EF of 50% and mild mid anterolateral hypocontractility.  Successful  percutaneous coronary intervention to the ramus intermediate vessel with ultimate insertion of a 2.526 mm Resolute Onyx DES stent postdilated to 2.71 mm with the 100% occlusion being reduced to 0% and resumption of brisk TIMI-3 flow.  RECOMMENDATION: The patient  should continue on DAPT a minimum of 1 year.  Post MI ACE-I/ARB,  blocker, and high potency statin therapy will be initiated.     EKG:  EKG is ordered today, personally reviewed, demonstrating NSR, 64 bpm, TWI III, LAD   Recent Labs: No results found for requested labs within last 8760 hours.  Recent Lipid Panel    Component Value Date/Time   CHOL 148 03/05/2018 0810   TRIG 204 (H) 03/05/2018 0810   HDL 43 03/05/2018 0810   CHOLHDL 3.4 03/05/2018 0810  CHOLHDL 4.8 02/24/2017 0324   VLDL 70 (H) 02/24/2017 0324   LDLCALC 64 03/05/2018 0810    PHYSICAL EXAM:    VS:  BP 124/70   Pulse 63   Ht 5\' 4"  (1.626 m)   Wt 198 lb (89.8 kg)   BMI 33.99 kg/m   BMI: Body mass index is 33.99 kg/m.  GEN: Well nourished, well developed, in no acute distress HEENT: normocephalic, atraumatic Neck: no JVD, carotid bruits, or masses Cardiac:RRR; no murmurs, rubs, or gallops, trace B/L LLedema  Respiratory:  clear to auscultation bilaterally, normal work of breathing GI: soft, nontender, nondistended, + BS MS: no deformity or atrophy Skin: warm and dry, no rash Neuro:  Alert and Oriented x 3, Strength and sensation are intact, follows commands Psych: euthymic mood, full affect  Wt Readings from Last 3 Encounters:  03/26/20 198 lb (89.8 kg)  03/26/19 196 lb (88.9 kg)  07/20/18 193 lb 9.6 oz (87.8 kg)     ASSESSMENT & PLAN:   CAD s/p DES to RI - Patient denies chest pain. She does have occasional sob since COVID in December 2020 - EKG shows NSR with nonspecific T wave changes lead III - continue Aspirin - refill NTG today  Hypertension - BP good today. Continue Irbesartan-HCTZ, Toprol-XL 25 mg daily - Labs in 11/2019 stable   HLD - TG 172 and LDL 94 in 11/2019 - increase pravastatin to 40 mg daily  Disposition: F/u with Dr. Sallyanne Kuster in 1 year   Medication Adjustments/Labs and Tests Ordered: Current medicines are reviewed at length with the patient today.  Concerns regarding  medicines are outlined above. Medication changes, Labs and Tests ordered today are summarized above and listed in the Patient Instructions accessible in Encounters.   Signed, Asanti Craigo Ninfa Meeker, PA-C  03/26/2020 4:25 PM    [facility] [place], [place], [place]  [address] Phone: [phone]; Fax: [phone]

## 2020-03-26 NOTE — Patient Instructions (Signed)
Medication Instructions:  NO CHANGE *If you need a refill on your cardiac medications before your next appointment, please call your pharmacy*   Lab Work: If you have labs (blood work) drawn today and your tests are completely normal, you will receive your results only by: . MyChart Message (if you have MyChart) OR . A paper copy in the mail If you have any lab test that is abnormal or we need to change your treatment, we will call you to review the results.   Follow-Up: At [facility], you and your health needs are our priority.  As part of our continuing mission to provide you with exceptional heart care, we have created designated Provider Care Teams.  These Care Teams include your primary Cardiologist (physician) and Advanced Practice Providers (APPs -  Physician Assistants and Nurse Practitioners) who all work together to provide you with the care you need, when you need it.  We recommend signing up for the patient portal called "MyChart".  Sign up information is provided on this After Visit Summary.  MyChart is used to connect with patients for Virtual Visits (Telemedicine).  Patients are able to view lab/test results, encounter notes, upcoming appointments, etc.  Non-urgent messages can be sent to your provider as well.   To learn more about what you can do with MyChart, go to [email or web address].    Your next appointment:   12 month(s)  The format for your next appointment:   Either In Person or Virtual  Provider:   You may see Mihai Croitoru, MD or one of the following Advanced Practice Providers on your designated Care Team:    Hao Meng, PA-C  Angela Duke, PA-C or   Krista Kroeger, PA-C     

## 2020-03-27 NOTE — Progress Notes (Signed)
Thanks

## 2020-03-30 ENCOUNTER — Telehealth: Payer: Self-pay | Admitting: *Deleted

## 2020-03-30 NOTE — Telephone Encounter (Signed)
Left message for pt to call, she was seen by the PA last week and after she left they reviewed the labs scanned into there chart and due to the LDL of 94, they would like the patient to increase her pravastatin to 40 mg once daily.

## 2020-04-06 DIAGNOSIS — I129 Hypertensive chronic kidney disease with stage 1 through stage 4 chronic kidney disease, or unspecified chronic kidney disease: Secondary | ICD-10-CM | POA: Diagnosis not present

## 2020-04-06 DIAGNOSIS — N200 Calculus of kidney: Secondary | ICD-10-CM | POA: Diagnosis not present

## 2020-04-06 DIAGNOSIS — N1831 Chronic kidney disease, stage 3a: Secondary | ICD-10-CM | POA: Diagnosis not present

## 2020-04-06 DIAGNOSIS — E785 Hyperlipidemia, unspecified: Secondary | ICD-10-CM | POA: Diagnosis not present

## 2020-04-06 DIAGNOSIS — K219 Gastro-esophageal reflux disease without esophagitis: Secondary | ICD-10-CM | POA: Diagnosis not present

## 2020-04-06 DIAGNOSIS — D72819 Decreased white blood cell count, unspecified: Secondary | ICD-10-CM | POA: Diagnosis not present

## 2020-04-06 DIAGNOSIS — I213 ST elevation (STEMI) myocardial infarction of unspecified site: Secondary | ICD-10-CM | POA: Diagnosis not present

## 2020-04-08 ENCOUNTER — Encounter: Payer: Self-pay | Admitting: Orthopaedic Surgery

## 2020-04-08 ENCOUNTER — Telehealth: Payer: Self-pay

## 2020-04-08 ENCOUNTER — Other Ambulatory Visit: Payer: Self-pay

## 2020-04-08 ENCOUNTER — Ambulatory Visit: Payer: PPO | Admitting: Orthopaedic Surgery

## 2020-04-08 DIAGNOSIS — M25561 Pain in right knee: Secondary | ICD-10-CM

## 2020-04-08 DIAGNOSIS — G8929 Other chronic pain: Secondary | ICD-10-CM

## 2020-04-08 DIAGNOSIS — M1711 Unilateral primary osteoarthritis, right knee: Secondary | ICD-10-CM

## 2020-04-08 MED ORDER — LIDOCAINE HCL 1 % IJ SOLN
3.0000 mL | INTRAMUSCULAR | Status: AC | PRN
  Administered 2020-04-08: 16:00:00 3 mL

## 2020-04-08 MED ORDER — METHYLPREDNISOLONE ACETATE 40 MG/ML IJ SUSP
40.0000 mg | INTRAMUSCULAR | Status: AC | PRN
  Administered 2020-04-08: 40 mg via INTRA_ARTICULAR

## 2020-04-08 NOTE — Telephone Encounter (Signed)
Please see message. °

## 2020-04-08 NOTE — Telephone Encounter (Signed)
Right knee gel injection  

## 2020-04-08 NOTE — Progress Notes (Signed)
Office Visit Note   Patient: [name] [name]           Date of Birth: 01/21/1951           MRN: [id number] Visit Date: 04/08/2020              Requested by: Prince Solian, MD [address] [place],   [address] PCP: Prince Solian, MD   Assessment & Plan: Visit Diagnoses:  1. Chronic pain of right knee   2. Unilateral primary osteoarthritis, right knee     Plan: I was able to aspirate about 20 cc of yellow fluid from the knee consistent with osteoarthritis.  I then placed a steroid injection in her right knee per her request.  Given the fact that hyaluronic acid helped her so well I think this is the best long-term treatment.  The steroid will temporize her symptoms while we put in order for hyaluronic acid with Monovisc.  Hopefully we will place that in her knee in 4 weeks from now.  All questions and concerns were answered and addressed.  Follow-Up Instructions: Return in about 4 weeks (around 05/06/2020).   Orders:  Orders Placed This Encounter  Procedures  . Large Joint Inj   No orders of the defined types were placed in this encounter.     Procedures: Large Joint Inj: R knee on 04/08/2020 3:59 PM Indications: diagnostic evaluation and pain Details: 22 G 1.5 in needle, superolateral approach  Arthrogram: No  Medications: 3 mL lidocaine 1 %; 40 mg methylPREDNISolone acetate 40 MG/ML Outcome: tolerated well, no immediate complications Procedure, treatment alternatives, risks and benefits explained, specific risks discussed. Consent was given by the patient. Immediately prior to procedure a time out was called to verify the correct patient, procedure, equipment, support staff and site/side marked as required. Patient was prepped and draped in the usual sterile fashion.       Clinical Data: No additional findings.   Subjective: Chief Complaint  Patient presents with  . Right Knee - Pain  The patient is well-known to me.  She has known osteoarthritis of her  right knee.  She last had a hyaluronic acid injection with Monovisc in August of last year so it has been over 8 months.  She is just developed right knee pain and swelling in the last month.  She has had no other acute change in her medical status.  Her knee does hurt with pivoting activities and with weightbearing but has not had any locking catching.  HPI  Review of Systems She currently denies any headache, chest pain, shortness of breath, fever, chills, nausea, vomiting  Objective: Vital Signs: There were no vitals taken for this visit.  Physical Exam She is alert and orient x3 and in no acute distress Ortho Exam Examination of her right knee does show a mild effusion.  There is medial joint line tenderness and pain throughout her arc of motion.  The knee is ligamentously stable. Specialty Comments:  No specialty comments available.  Imaging: No results found.   PMFS History: Patient Active Problem List   Diagnosis Date Noted  . Unilateral primary osteoarthritis, right knee 04/08/2020  . CAD (coronary artery disease) s/p DES to RI in 2018 03/26/2020  . Status post arthroscopy of right knee 06/13/2019  . Essential hypertension 03/31/2017  . Left ventricular dysfunction 03/31/2017  . Nephrolithiasis 03/31/2017  . Mild obesity 03/31/2017  . Mixed hyperlipidemia 03/14/2017  . NSTEMI (non-ST elevated myocardial infarction) ([place]) 02/23/2017   Past  Medical History:  Diagnosis Date  . Basal cell carcinoma of nose    S/P cream, burn, cut off w/skin graft  . Chronic lower back pain   . Coronary artery disease   . Eczema   . GERD (gastroesophageal reflux disease)   . High cholesterol   . History of kidney stones   . Hypertension   . Myocardial infarction ([place]) 02/23/2017   Non-stemi  . Osteoarthritis    "severe in neck, lower back, hands" (02/24/2017)  . Sinus headache     Family History  Problem Relation Age of Onset  . Heart attack Father   . Heart attack Paternal  Grandmother     Past Surgical History:  Procedure Laterality Date  . BASAL CELL CARCINOMA EXCISION     w/skin graft to my nose  . BREAST DUCTAL SYSTEM EXCISION Right 1991   milk duct  . CARPAL TUNNEL RELEASE Right 1996  . COLONOSCOPY    . CORONARY ANGIOPLASTY WITH STENT PLACEMENT  02/24/2017   Ramus lesion, 60 %stenosed, A STENT RESOLUTE ONYX 2.5X26 drug eluting stent was successfully placed.Ost Ramus lesion, 100 %stenosed.Post intervention, there is a 0% residual stenosis.  . CORONARY STENT INTERVENTION N/A 02/24/2017   Procedure: Coronary Stent Intervention;  Surgeon: Troy Sine, MD;  Location: [facility];  Service: Cardiovascular;  Laterality: N/A;  ramus  . CYST EXCISION Left 2002   arthritic cysts on index finger  . CYSTOSCOPY W/ STONE MANIPULATION Left 04/2006   kidney stone on the left obtained  . CYSTOSCOPY/URETEROSCOPY/HOLMIUM LASER/STENT PLACEMENT Right 03/16/2018   Procedure: CYSTOSCOPY/RETROGRADE/URETEROSCOPY/HOLMIUM LASER/STENT PLACEMENT;  Surgeon: Kathie Rhodes, MD;  Location[facility];  Service: Urology;  Laterality: Right;  . CYSTOSCOPY/URETEROSCOPY/HOLMIUM LASER/STENT PLACEMENT Left 07/20/2018   Procedure: CYSTOSCOPY/RETROGRADE/URETEROSCOPY/HOLMIUM LASER/STENT PLACEMENT;  Surgeon: Kathie Rhodes, MD;  Location: [facility];  Service: Urology;  Laterality: Left;  . DILATION AND CURETTAGE OF UTERUS    . EXTRACORPOREAL SHOCK WAVE LITHOTRIPSY  03/2014   Archie Endo 03/10/2014  . EXTRACORPOREAL SHOCK WAVE LITHOTRIPSY Left 10/05/2017   Procedure: LEFT EXTRACORPOREAL SHOCK WAVE LITHOTRIPSY (ESWL);  Surgeon: Kathie Rhodes, MD;  Location[facility];  Service: Urology;  Laterality: Left;  . LEFT HEART CATH AND CORONARY ANGIOGRAPHY N/A 02/24/2017   Procedure: Left Heart Cath and Coronary Angiography;  Surgeon: Troy Sine, MD;  Location: [facility];  Service: Cardiovascular;  Laterality: N/A;  . VAGINAL HYSTERECTOMY    . WISDOM TOOTH EXTRACTION     Social  History   Occupational History  . Not on file  Tobacco Use  . Smoking status: Never Smoker  . Smokeless tobacco: Never Used  Substance and Sexual Activity  . Alcohol use: No  . Drug use: No  . Sexual activity: Yes    Birth control/protection: Surgical

## 2020-04-08 NOTE — Telephone Encounter (Signed)
Noted  

## 2020-04-10 ENCOUNTER — Telehealth: Payer: Self-pay

## 2020-04-10 NOTE — Telephone Encounter (Signed)
Submitted VOB for Monovisc, right knee. 

## 2020-04-13 ENCOUNTER — Telehealth: Payer: Self-pay

## 2020-04-13 NOTE — Telephone Encounter (Signed)
Approved for Monovisc, right knee. [place] Patient will be responsible for 20% OOP. Co-pay of $30.00 No PA required  Appt. 05/06/2020 with Dr. Ninfa Linden

## 2020-04-22 ENCOUNTER — Other Ambulatory Visit: Payer: Self-pay | Admitting: Cardiovascular Disease

## 2020-05-06 ENCOUNTER — Ambulatory Visit: Payer: PPO | Admitting: Orthopaedic Surgery

## 2020-05-06 ENCOUNTER — Other Ambulatory Visit: Payer: Self-pay

## 2020-05-06 ENCOUNTER — Encounter: Payer: Self-pay | Admitting: Orthopaedic Surgery

## 2020-05-06 DIAGNOSIS — M1711 Unilateral primary osteoarthritis, right knee: Secondary | ICD-10-CM

## 2020-05-06 MED ORDER — HYALURONAN 88 MG/4ML IX SOSY
88.0000 mg | PREFILLED_SYRINGE | INTRA_ARTICULAR | Status: AC | PRN
  Administered 2020-05-06: 88 mg via INTRA_ARTICULAR

## 2020-05-06 NOTE — Progress Notes (Signed)
   Procedure Note  Patient: [name] [name]             Date of Birth: Jun 22, 1951           MRN: [id number]             Visit Date: 05/06/2020  Procedures: Visit Diagnoses:  1. Unilateral primary osteoarthritis, right knee     Large Joint Inj: R knee on 05/06/2020 4:12 PM Indications: diagnostic evaluation and pain Details: 22 G 1.5 in needle, superolateral approach  Arthrogram: No  Medications: 88 mg Hyaluronan 88 MG/4ML Outcome: tolerated well, no immediate complications Procedure, treatment alternatives, risks and benefits explained, specific risks discussed. Consent was given by the patient. Immediately prior to procedure a time out was called to verify the correct patient, procedure, equipment, support staff and site/side marked as required. Patient was prepped and draped in the usual sterile fashion.     Patient comes in today for scheduled hyaluronic acid injection with Monovisc into her right knee.  That is the only thing that is helped.  She has tried and failed conservative treatment with steroid injections.  She has known osteoarthritis of the right knee.  She has had no other acute change in her medical status.  She has had hyaluronic acid in the past.  She understands fully the risk and benefits of injection such as this.  Her right knee shows no effusion today but does have significant medial joint line tenderness and patellofemoral joint tenderness.  There is good range of motion of the knee.  I did place Monovisc in the right knee without difficulty today.  All questions and concerns were answered and addressed.  She understands that we cannot do this again until least 6 months.  She will let us know if she would like [place] to order this again in 6 months.

## 2020-06-09 ENCOUNTER — Other Ambulatory Visit: Payer: Self-pay | Admitting: Cardiovascular Disease

## 2020-06-15 DIAGNOSIS — Z85828 Personal history of other malignant neoplasm of skin: Secondary | ICD-10-CM | POA: Diagnosis not present

## 2020-06-15 DIAGNOSIS — C44519 Basal cell carcinoma of skin of other part of trunk: Secondary | ICD-10-CM | POA: Diagnosis not present

## 2020-06-15 DIAGNOSIS — D1801 Hemangioma of skin and subcutaneous tissue: Secondary | ICD-10-CM | POA: Diagnosis not present

## 2020-06-15 DIAGNOSIS — D225 Melanocytic nevi of trunk: Secondary | ICD-10-CM | POA: Diagnosis not present

## 2020-06-15 DIAGNOSIS — L281 Prurigo nodularis: Secondary | ICD-10-CM | POA: Diagnosis not present

## 2020-06-15 DIAGNOSIS — D485 Neoplasm of uncertain behavior of skin: Secondary | ICD-10-CM | POA: Diagnosis not present

## 2020-07-29 ENCOUNTER — Other Ambulatory Visit: Payer: Self-pay | Admitting: Cardiovascular Disease

## 2020-11-03 ENCOUNTER — Other Ambulatory Visit: Payer: Self-pay | Admitting: Cardiovascular Disease

## 2020-11-26 ENCOUNTER — Other Ambulatory Visit: Payer: Self-pay | Admitting: Cardiovascular Disease

## 2020-12-14 DIAGNOSIS — E785 Hyperlipidemia, unspecified: Secondary | ICD-10-CM | POA: Diagnosis not present

## 2020-12-16 DIAGNOSIS — J302 Other seasonal allergic rhinitis: Secondary | ICD-10-CM | POA: Diagnosis not present

## 2020-12-16 DIAGNOSIS — M5380 Other specified dorsopathies, site unspecified: Secondary | ICD-10-CM | POA: Diagnosis not present

## 2020-12-16 DIAGNOSIS — E785 Hyperlipidemia, unspecified: Secondary | ICD-10-CM | POA: Diagnosis not present

## 2020-12-16 DIAGNOSIS — D72819 Decreased white blood cell count, unspecified: Secondary | ICD-10-CM | POA: Diagnosis not present

## 2020-12-16 DIAGNOSIS — Z0000 Encounter for general adult medical examination without abnormal findings: Secondary | ICD-10-CM | POA: Diagnosis not present

## 2020-12-16 DIAGNOSIS — Z23 Encounter for immunization: Secondary | ICD-10-CM | POA: Diagnosis not present

## 2020-12-16 DIAGNOSIS — N1831 Chronic kidney disease, stage 3a: Secondary | ICD-10-CM | POA: Diagnosis not present

## 2020-12-16 DIAGNOSIS — I213 ST elevation (STEMI) myocardial infarction of unspecified site: Secondary | ICD-10-CM | POA: Diagnosis not present

## 2020-12-16 DIAGNOSIS — M1990 Unspecified osteoarthritis, unspecified site: Secondary | ICD-10-CM | POA: Diagnosis not present

## 2020-12-16 DIAGNOSIS — I129 Hypertensive chronic kidney disease with stage 1 through stage 4 chronic kidney disease, or unspecified chronic kidney disease: Secondary | ICD-10-CM | POA: Diagnosis not present

## 2021-01-06 ENCOUNTER — Other Ambulatory Visit: Payer: Self-pay | Admitting: Cardiovascular Disease

## 2021-02-15 DIAGNOSIS — D230 Other benign neoplasm of skin of lip: Secondary | ICD-10-CM | POA: Diagnosis not present

## 2021-02-15 DIAGNOSIS — Z85828 Personal history of other malignant neoplasm of skin: Secondary | ICD-10-CM | POA: Diagnosis not present

## 2021-02-15 DIAGNOSIS — D485 Neoplasm of uncertain behavior of skin: Secondary | ICD-10-CM | POA: Diagnosis not present

## 2021-03-10 ENCOUNTER — Other Ambulatory Visit: Payer: Self-pay | Admitting: Cardiovascular Disease

## 2021-04-07 ENCOUNTER — Other Ambulatory Visit: Payer: Self-pay

## 2021-04-07 ENCOUNTER — Ambulatory Visit: Payer: PPO | Admitting: Cardiovascular Disease

## 2021-04-07 ENCOUNTER — Encounter: Payer: Self-pay | Admitting: Cardiovascular Disease

## 2021-04-07 VITALS — BP 128/72 | HR 56 | Ht 64.0 in | Wt 198.8 lb

## 2021-04-07 DIAGNOSIS — I2510 Atherosclerotic heart disease of native coronary artery without angina pectoris: Secondary | ICD-10-CM | POA: Diagnosis not present

## 2021-04-07 NOTE — Patient Instructions (Signed)
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
## 2021-04-08 ENCOUNTER — Telehealth: Payer: Self-pay

## 2021-04-08 ENCOUNTER — Encounter: Payer: Self-pay | Admitting: Orthopaedic Surgery

## 2021-04-08 ENCOUNTER — Ambulatory Visit: Payer: PPO | Admitting: Orthopaedic Surgery

## 2021-04-08 DIAGNOSIS — M25561 Pain in right knee: Secondary | ICD-10-CM

## 2021-04-08 DIAGNOSIS — G8929 Other chronic pain: Secondary | ICD-10-CM | POA: Diagnosis not present

## 2021-04-08 MED ORDER — LIDOCAINE HCL 1 % IJ SOLN
3.0000 mL | INTRAMUSCULAR | Status: AC | PRN
  Administered 2021-04-08: 3 mL

## 2021-04-08 MED ORDER — METHYLPREDNISOLONE ACETATE 40 MG/ML IJ SUSP
40.0000 mg | INTRAMUSCULAR | Status: AC | PRN
  Administered 2021-04-08: 40 mg via INTRA_ARTICULAR
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
## 2021-04-08 NOTE — Telephone Encounter (Signed)
Please get auth for right knee gel injection -Dr. Blackman pt.  

## 2021-04-09 NOTE — Telephone Encounter (Signed)
Noted  

## 2021-05-04 ENCOUNTER — Telehealth: Payer: Self-pay

## 2021-05-04 ENCOUNTER — Telehealth: Payer: Self-pay | Admitting: Orthopaedic Surgery

## 2021-05-04 NOTE — Telephone Encounter (Signed)
Called and left a VM advising patient to CB to schedule an appointment with Dr. Ninfa Linden or Artis Delay for gel injection.

## 2021-05-04 NOTE — Telephone Encounter (Signed)
VOB has been submitted for Monovisc, right knee. Pending BV.

## 2021-05-04 NOTE — Telephone Encounter (Signed)
Pt called stating she was waiting to hear back about a gel injection; and would like a CB to discuss how much longer the wait might be.   [phone]

## 2021-05-04 NOTE — Telephone Encounter (Signed)
Approved for Monovisc, right knee. [place] Patient will be responsible for 20% OOP. Co-pay $30.00 No PA required  Appt. 05/06/2021 with Dr. Ninfa Linden

## 2021-05-06 ENCOUNTER — Encounter: Payer: Self-pay | Admitting: Orthopaedic Surgery

## 2021-05-06 ENCOUNTER — Ambulatory Visit: Payer: PPO | Admitting: Orthopaedic Surgery

## 2021-05-06 DIAGNOSIS — M1711 Unilateral primary osteoarthritis, right knee: Secondary | ICD-10-CM | POA: Diagnosis not present

## 2021-05-06 MED ORDER — HYALURONAN 88 MG/4ML IX SOSY
88.0000 mg | PREFILLED_SYRINGE | INTRA_ARTICULAR | Status: AC | PRN
  Administered 2021-05-06: 88 mg via INTRA_ARTICULAR

## 2021-05-06 NOTE — Progress Notes (Signed)
   Procedure Note  Patient: [name] [name]             Date of Birth: 1951-01-24           MRN: [id number]             Visit Date: 05/06/2021  Procedures: Visit Diagnoses:  1. Unilateral primary osteoarthritis, right knee     Large Joint Inj: R knee on 05/06/2021 2:52 PM Indications: diagnostic evaluation and pain Details: 22 G 1.5 in needle, superolateral approach  Arthrogram: No  Medications: 88 mg Hyaluronan 88 MG/4ML Outcome: tolerated well, no immediate complications Procedure, treatment alternatives, risks and benefits explained, specific risks discussed. Consent was given by the patient. Immediately prior to procedure a time out was called to verify the correct patient, procedure, equipment, support staff and site/side marked as required. Patient was prepped and draped in the usual sterile fashion.    The patient comes in today for scheduled Monovisc injection in her right knee to treat the pain from osteoarthritis.  She has tried and failed other conservative treatments including steroid injections.  She is trying to hold off on knee replacement surgery.  There is no significant effusion of her right knee but there is medial joint line tenderness and patellofemoral crepitation with varus malalignment.  I did place Monovisc in her right knee without difficulty.  She understands we can do this again in 6 months if needed.  The other option would be knee replacement surgery if this fails.  All questions and concerns were answered addressed.  Follow-up is as needed.

## 2021-05-12 ENCOUNTER — Other Ambulatory Visit: Payer: Self-pay | Admitting: Cardiovascular Disease

## 2021-06-10 ENCOUNTER — Other Ambulatory Visit: Payer: Self-pay | Admitting: Cardiovascular Disease

## 2021-06-15 DIAGNOSIS — D485 Neoplasm of uncertain behavior of skin: Secondary | ICD-10-CM | POA: Diagnosis not present

## 2021-06-15 DIAGNOSIS — D1801 Hemangioma of skin and subcutaneous tissue: Secondary | ICD-10-CM | POA: Diagnosis not present

## 2021-06-15 DIAGNOSIS — L821 Other seborrheic keratosis: Secondary | ICD-10-CM | POA: Diagnosis not present

## 2021-06-15 DIAGNOSIS — Z85828 Personal history of other malignant neoplasm of skin: Secondary | ICD-10-CM | POA: Diagnosis not present

## 2021-06-15 DIAGNOSIS — C44319 Basal cell carcinoma of skin of other parts of face: Secondary | ICD-10-CM | POA: Diagnosis not present

## 2021-06-15 DIAGNOSIS — L918 Other hypertrophic disorders of the skin: Secondary | ICD-10-CM | POA: Diagnosis not present

## 2021-06-15 DIAGNOSIS — L853 Xerosis cutis: Secondary | ICD-10-CM | POA: Diagnosis not present

## 2021-06-15 DIAGNOSIS — D224 Melanocytic nevi of scalp and neck: Secondary | ICD-10-CM | POA: Diagnosis not present

## 2021-06-21 ENCOUNTER — Other Ambulatory Visit: Payer: Self-pay

## 2021-06-21 ENCOUNTER — Encounter: Payer: Self-pay | Admitting: Orthopaedic Surgery

## 2021-06-21 ENCOUNTER — Ambulatory Visit (INDEPENDENT_AMBULATORY_CARE_PROVIDER_SITE_OTHER): Payer: PPO | Admitting: Orthopaedic Surgery

## 2021-06-21 VITALS — Ht 64.0 in | Wt 193.0 lb

## 2021-06-21 DIAGNOSIS — M1711 Unilateral primary osteoarthritis, right knee: Secondary | ICD-10-CM | POA: Diagnosis not present

## 2021-06-21 MED ORDER — LIDOCAINE HCL 1 % IJ SOLN
3.0000 mL | INTRAMUSCULAR | Status: AC | PRN
  Administered 2021-06-21: 3 mL

## 2021-06-21 MED ORDER — METHYLPREDNISOLONE ACETATE 40 MG/ML IJ SUSP
40.0000 mg | INTRAMUSCULAR | Status: AC | PRN
  Administered 2021-06-21: 40 mg via INTRA_ARTICULAR
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
## 2021-06-23 ENCOUNTER — Ambulatory Visit: Payer: PPO | Admitting: Physician Assistant

## 2021-06-28 ENCOUNTER — Ambulatory Visit: Payer: PPO | Admitting: Orthopaedic Surgery

## 2021-07-05 ENCOUNTER — Other Ambulatory Visit: Payer: Self-pay | Admitting: Internal Medicine

## 2021-07-05 DIAGNOSIS — M5380 Other specified dorsopathies, site unspecified: Secondary | ICD-10-CM | POA: Diagnosis not present

## 2021-07-05 DIAGNOSIS — M1990 Unspecified osteoarthritis, unspecified site: Secondary | ICD-10-CM | POA: Diagnosis not present

## 2021-07-05 DIAGNOSIS — K635 Polyp of colon: Secondary | ICD-10-CM | POA: Diagnosis not present

## 2021-07-05 DIAGNOSIS — D72819 Decreased white blood cell count, unspecified: Secondary | ICD-10-CM | POA: Diagnosis not present

## 2021-07-05 DIAGNOSIS — J302 Other seasonal allergic rhinitis: Secondary | ICD-10-CM | POA: Diagnosis not present

## 2021-07-05 DIAGNOSIS — R109 Unspecified abdominal pain: Secondary | ICD-10-CM | POA: Diagnosis not present

## 2021-07-05 DIAGNOSIS — K219 Gastro-esophageal reflux disease without esophagitis: Secondary | ICD-10-CM | POA: Diagnosis not present

## 2021-07-05 DIAGNOSIS — N1831 Chronic kidney disease, stage 3a: Secondary | ICD-10-CM | POA: Diagnosis not present

## 2021-07-05 DIAGNOSIS — N200 Calculus of kidney: Secondary | ICD-10-CM | POA: Diagnosis not present

## 2021-07-05 DIAGNOSIS — E785 Hyperlipidemia, unspecified: Secondary | ICD-10-CM | POA: Diagnosis not present

## 2021-07-05 DIAGNOSIS — I129 Hypertensive chronic kidney disease with stage 1 through stage 4 chronic kidney disease, or unspecified chronic kidney disease: Secondary | ICD-10-CM | POA: Diagnosis not present

## 2021-07-05 DIAGNOSIS — I213 ST elevation (STEMI) myocardial infarction of unspecified site: Secondary | ICD-10-CM | POA: Diagnosis not present

## 2021-07-19 DIAGNOSIS — C44319 Basal cell carcinoma of skin of other parts of face: Secondary | ICD-10-CM | POA: Diagnosis not present

## 2021-07-19 DIAGNOSIS — Z85828 Personal history of other malignant neoplasm of skin: Secondary | ICD-10-CM | POA: Diagnosis not present

## 2021-07-30 ENCOUNTER — Other Ambulatory Visit: Payer: Self-pay | Admitting: Cardiovascular Disease

## 2021-08-02 ENCOUNTER — Other Ambulatory Visit: Payer: Self-pay | Admitting: Physician Assistant

## 2021-08-02 DIAGNOSIS — K219 Gastro-esophageal reflux disease without esophagitis: Secondary | ICD-10-CM | POA: Diagnosis not present

## 2021-08-02 DIAGNOSIS — R1010 Upper abdominal pain, unspecified: Secondary | ICD-10-CM

## 2021-08-02 DIAGNOSIS — Z86010 Personal history of colonic polyps: Secondary | ICD-10-CM | POA: Diagnosis not present

## 2021-08-02 DIAGNOSIS — Z1211 Encounter for screening for malignant neoplasm of colon: Secondary | ICD-10-CM | POA: Diagnosis not present

## 2021-08-10 ENCOUNTER — Ambulatory Visit
Admission: EM | Admit: 2021-08-10 | Discharge: 2021-08-10 | Disposition: A | Discharge status: Home / Self Care | Payer: PPO | Attending: Internal Medicine | Admitting: Internal Medicine

## 2021-08-10 ENCOUNTER — Other Ambulatory Visit: Payer: Self-pay

## 2021-08-10 DIAGNOSIS — Z20822 Contact with and (suspected) exposure to covid-19: Secondary | ICD-10-CM

## 2021-08-10 DIAGNOSIS — J069 Acute upper respiratory infection, unspecified: Secondary | ICD-10-CM

## 2021-08-10 MED ORDER — BENZONATATE 100 MG PO CAPS: 100.0000 mg | ORAL_CAPSULE | Freq: Three times a day (TID) | ORAL | 0 refills | Status: DC | PRN

## 2021-08-10 MED ORDER — PREDNISONE 20 MG PO TABS: 40.0000 mg | ORAL_TABLET | Freq: Every day | ORAL | 0 refills | Status: AC

## 2021-08-10 NOTE — ED Triage Notes (Signed)
Three day h/o cough and sinus pressure. Pt notes one day of hoarseness that resolved independently. Onset yesterday of fatigue.

## 2021-08-10 NOTE — Discharge Instructions (Addendum)
You likely having a viral upper respiratory infection. We recommended symptom control. I expect your symptoms to start improving in the next 1-2 weeks.   1. Take a daily allergy pill/anti-histamine like Zyrtec, Claritin, or Store brand consistently for 2 weeks  2. For congestion you may try an oral decongestant like Mucinex or coricidin HBP. You may also try intranasal flonase nasal spray or saline irrigations (neti pot, sinus cleanse)  3. For your sore throat you may try cepacol lozenges, salt water gargles, throat spray. Treatment of congestion may also help your sore throat.  4. For cough you may try Robitussen  5. Take Tylenol or Ibuprofen to help with pain/inflammation  6. Stay hydrated, drink plenty of fluids to keep throat coated and less irritated  Honey Tea For cough/sore throat try using a honey-based tea. Use 3 teaspoons of honey with juice squeezed from half lemon. Place shaved pieces of ginger into 1/2-1 cup of water and warm over stove top. Then mix the ingredients and repeat every 4 hours as needed.   You have been prescribed prednisone and benzonatate cough medication to help alleviate symptoms.  Your COVID-19 test is pending.  We will call if it is positive.  Please go to the hospital shortness of breath worsens.
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
## 2021-08-11 LAB — SARS-COV-2, NAA 2 DAY TAT

## 2021-08-11 LAB — NOVEL CORONAVIRUS, NAA: SARS-CoV-2, NAA: NOT DETECTED

## 2021-08-16 ENCOUNTER — Ambulatory Visit
Admission: RE | Admit: 2021-08-16 | Discharge: 2021-08-16 | Disposition: A | Discharge status: Home / Self Care | Payer: PPO | Source: Ambulatory Visit | Attending: Physician Assistant | Admitting: Physician Assistant

## 2021-08-16 DIAGNOSIS — K824 Cholesterolosis of gallbladder: Secondary | ICD-10-CM | POA: Diagnosis not present

## 2021-08-16 DIAGNOSIS — K760 Fatty (change of) liver, not elsewhere classified: Secondary | ICD-10-CM | POA: Diagnosis not present

## 2021-08-16 DIAGNOSIS — R1010 Upper abdominal pain, unspecified: Secondary | ICD-10-CM

## 2021-08-16 DIAGNOSIS — J189 Pneumonia, unspecified organism: Secondary | ICD-10-CM | POA: Diagnosis not present

## 2021-08-16 DIAGNOSIS — R0981 Nasal congestion: Secondary | ICD-10-CM | POA: Diagnosis not present

## 2021-08-16 DIAGNOSIS — J101 Influenza due to other identified influenza virus with other respiratory manifestations: Secondary | ICD-10-CM | POA: Diagnosis not present

## 2021-08-16 DIAGNOSIS — Z1152 Encounter for screening for COVID-19: Secondary | ICD-10-CM | POA: Diagnosis not present

## 2021-08-16 DIAGNOSIS — R5383 Other fatigue: Secondary | ICD-10-CM | POA: Diagnosis not present

## 2021-08-16 DIAGNOSIS — J302 Other seasonal allergic rhinitis: Secondary | ICD-10-CM | POA: Diagnosis not present

## 2021-08-16 DIAGNOSIS — R051 Acute cough: Secondary | ICD-10-CM | POA: Diagnosis not present

## 2021-09-06 DIAGNOSIS — J189 Pneumonia, unspecified organism: Secondary | ICD-10-CM | POA: Diagnosis not present

## 2021-10-12 DIAGNOSIS — K5730 Diverticulosis of large intestine without perforation or abscess without bleeding: Secondary | ICD-10-CM | POA: Diagnosis not present

## 2021-10-12 DIAGNOSIS — R1013 Epigastric pain: Secondary | ICD-10-CM | POA: Diagnosis not present

## 2021-10-12 DIAGNOSIS — K219 Gastro-esophageal reflux disease without esophagitis: Secondary | ICD-10-CM | POA: Diagnosis not present

## 2021-10-12 DIAGNOSIS — K649 Unspecified hemorrhoids: Secondary | ICD-10-CM | POA: Diagnosis not present

## 2021-10-12 DIAGNOSIS — D128 Benign neoplasm of rectum: Secondary | ICD-10-CM | POA: Diagnosis not present

## 2021-10-12 DIAGNOSIS — Z1211 Encounter for screening for malignant neoplasm of colon: Secondary | ICD-10-CM | POA: Diagnosis not present

## 2021-10-15 ENCOUNTER — Other Ambulatory Visit: Payer: Self-pay

## 2021-10-15 DIAGNOSIS — D128 Benign neoplasm of rectum: Secondary | ICD-10-CM | POA: Diagnosis not present

## 2021-11-11 ENCOUNTER — Other Ambulatory Visit: Payer: Self-pay | Admitting: Physician Assistant

## 2021-11-11 DIAGNOSIS — M1711 Unilateral primary osteoarthritis, right knee: Secondary | ICD-10-CM

## 2021-11-19 NOTE — Progress Notes (Signed)
Surgical Instructions    Your procedure is scheduled on 11/30/21.  Report to [facility] Main Entrance "A" at 10:00 A.M., then check in with the Admitting office.  Call this number if you have problems the morning of surgery:  [phone]   If you have any questions prior to your surgery date call [phone]: Open Monday-Friday 8am-4pm    Remember:  Do not eat after midnight the night before your surgery  You may drink clear liquids until 9:00 the morning of your surgery.   Clear liquids allowed are: Water, Non-Citrus Juices (without pulp), Carbonated Beverages, Clear Tea, Black Coffee ONLY (NO MILK, CREAM OR POWDERED CREAMER of any kind), and Gatorade  Please complete your PRE-SURGERY ENSURE that was provided to you by 9:00 the morning of surgery.  Please, if able, drink it in one setting. DO NOT SIP.     Take these medicines the morning of surgery with A SIP OF WATER:  DULoxetine (CYMBALTA) ezetimibe (ZETIA) fenofibrate  metoprolol succinate (TOPROL-XL) pantoprazole (PROTONIX) rosuvastatin (CRESTOR)   If Needed: nitroGLYCERIN (NITROSTAT) Propylene Glycol (SYSTANE BALANCE OP) traMADol (ULTRAM)    As of today, STOP taking any Aspirin (unless otherwise instructed by your surgeon) Aleve, Naproxen, Ibuprofen, Motrin, Advil, Goody's, BC's, all herbal medications, fish oil, and all vitamins.   After your COVID test   You are not required to quarantine however you are required to wear a well-fitting mask when you are out and around people not in your household.  If your mask becomes wet or soiled, replace with a new one.  Wash your hands often with soap and water for 20 seconds or clean your hands with an alcohol-based hand sanitizer that contains at least 60% alcohol.  Do not share personal items.  Notify your provider: if you are in close contact with someone who has COVID  or if you develop a fever of 100.4 or greater, sneezing, cough, sore throat, shortness of breath or  body aches.          Do not wear jewelry or makeup Do not wear lotions, powders, perfumes or deodorant. Do not shave 48 hours prior to surgery.  Do not bring valuables to the hospital. DO Not wear nail polish, gel polish, artificial nails, or any other type of covering on natural nails including finger and toenails. If patients have artificial nails, gel coating, etc. that need to be removed by a nail salon, please have this removed prior to surgery or surgery may need to be canceled/delayed if the surgeon/ anesthesia feels like the patient is unable to be adequately monitored.             [facility] is not responsible for any belongings or valuables.  Do NOT Smoke (Tobacco/Vaping)  24 hours prior to your procedure  If you use a CPAP at night, you may bring your mask for your overnight stay.   Contacts, glasses, hearing aids, dentures or partials may not be worn into surgery, please bring cases for these belongings   For patients admitted to the hospital, discharge time will be determined by your treatment team.   Patients discharged the day of surgery will not be allowed to drive home, and someone needs to stay with them for 24 hours.  NO VISITORS WILL BE ALLOWED IN PRE-OP WHERE PATIENTS ARE PREPPED FOR SURGERY.  ONLY 1 SUPPORT PERSON MAY BE PRESENT IN THE WAITING ROOM WHILE YOU ARE IN SURGERY.  IF YOU ARE TO BE ADMITTED, ONCE YOU ARE IN [place]  ROOM YOU WILL BE ALLOWED TWO (2) VISITORS. 1 (ONE) VISITOR MAY STAY OVERNIGHT BUT MUST ARRIVE TO THE ROOM BY 8pm.  Minor children may have two parents present. Special consideration for safety and communication needs will be reviewed on a case by case basis.  Special instructions:    Oral Hygiene is also important to reduce your risk of infection.  Remember - BRUSH YOUR TEETH THE MORNING OF SURGERY WITH YOUR REGULAR TOOTHPASTE   - Preparing For Surgery  Before surgery, you can play an important role. Because skin is not sterile, your  skin needs to be as free of germs as possible. You can reduce the number of germs on your skin by washing with CHG (chlorahexidine gluconate) Soap before surgery.  CHG is an antiseptic cleaner which kills germs and bonds with the skin to continue killing germs even after washing.     Please do not use if you have an allergy to CHG or antibacterial soaps. If your skin becomes reddened/irritated stop using the CHG.  Do not shave (including legs and underarms) for at least 48 hours prior to first CHG shower. It is OK to shave your face.  Please follow these instructions carefully.     Shower the NIGHT BEFORE SURGERY and the MORNING OF SURGERY with CHG Soap.   If you chose to wash your hair, wash your hair first as usual with your normal shampoo. After you shampoo, rinse your hair and body thoroughly to remove the shampoo.  Then [employer] and genitals (private parts) with your normal soap and rinse thoroughly to remove soap.  After that Use CHG Soap as you would any other liquid soap. You can apply CHG directly to the skin and wash gently with a scrungie or a clean washcloth.   Apply the CHG Soap to your body ONLY FROM THE NECK DOWN.  Do not use on open wounds or open sores. Avoid contact with your eyes, ears, mouth and genitals (private parts). Wash Face and genitals (private parts)  with your normal soap.   Wash thoroughly, paying special attention to the area where your surgery will be performed.  Thoroughly rinse your body with warm water from the neck down.  DO NOT shower/wash with your normal soap after using and rinsing off the CHG Soap.  Pat yourself dry with a CLEAN TOWEL.  Wear CLEAN PAJAMAS to bed the night before surgery  Place CLEAN SHEETS on your bed the night before your surgery  DO NOT SLEEP WITH PETS.   Day of Surgery:  Take a shower with CHG soap. Wear Clean/Comfortable clothing the morning of surgery Do not apply any deodorants/lotions.   Remember to brush your  teeth WITH YOUR REGULAR TOOTHPASTE.   Please read over the following fact sheets that you were given.

## 2021-11-22 ENCOUNTER — Encounter (HOSPITAL_COMMUNITY)
Admission: RE | Admit: 2021-11-22 | Discharge: 2021-11-22 | Disposition: A | Discharge status: Home / Self Care | Payer: PPO | Source: Ambulatory Visit | Attending: Orthopaedic Surgery | Admitting: Orthopaedic Surgery

## 2021-11-22 ENCOUNTER — Encounter (HOSPITAL_COMMUNITY): Payer: Self-pay

## 2021-11-22 ENCOUNTER — Other Ambulatory Visit: Payer: Self-pay

## 2021-11-22 VITALS — BP 125/64 | HR 69 | Temp 98.4°F | Resp 17 | Ht 64.0 in | Wt 190.7 lb

## 2021-11-22 DIAGNOSIS — Z01812 Encounter for preprocedural laboratory examination: Secondary | ICD-10-CM | POA: Diagnosis not present

## 2021-11-22 DIAGNOSIS — Z01818 Encounter for other preprocedural examination: Secondary | ICD-10-CM

## 2021-11-22 LAB — TYPE AND SCREEN
ABO/RH(D): O POS
Antibody Screen: NEGATIVE

## 2021-11-22 LAB — CBC
HCT: 43.6 % (ref 36.0–46.0)
Hemoglobin: 13.6 g/dL (ref 12.0–15.0)
MCH: 29.5 pg (ref 26.0–34.0)
MCHC: 31.2 g/dL (ref 30.0–36.0)
MCV: 94.6 fL (ref 80.0–100.0)
Platelets: 239 10*3/uL (ref 150–400)
RBC: 4.61 MIL/uL (ref 3.87–5.11)
RDW: 13.3 % (ref 11.5–15.5)
WBC: 3.4 10*3/uL — ABNORMAL LOW (ref 4.0–10.5)
nRBC: 0 % (ref 0.0–0.2)

## 2021-11-22 LAB — BASIC METABOLIC PANEL
Anion gap: 7 (ref 5–15)
BUN: 21 mg/dL (ref 8–23)
CO2: 26 mmol/L (ref 22–32)
Calcium: 9.3 mg/dL (ref 8.9–10.3)
Chloride: 106 mmol/L (ref 98–111)
Creatinine, Ser: 1.02 mg/dL — ABNORMAL HIGH (ref 0.44–1.00)
GFR, Estimated: 60 mL/min — ABNORMAL LOW (ref >60)
Glucose, Bld: 137 mg/dL — ABNORMAL HIGH (ref 70–99)
Potassium: 4.2 mmol/L (ref 3.5–5.1)
Sodium: 139 mmol/L (ref 135–145)

## 2021-11-22 LAB — SURGICAL PCR SCREEN
MRSA, PCR: NEGATIVE
Staphylococcus aureus: POSITIVE — AB

## 2021-11-22 NOTE — Progress Notes (Signed)
PCP: Richardson Landry, MD Cardiologist: Sanda Klein, MD  EKG: 04/07/21 CXR: 02/23/17 ECHO: 04/17/17 Stress Test: denies Cardiac Cath:02/24/17  Fasting Blood Sugar- na Checks Blood Sugar__na_ times a day  ASA: Patient to call MD for instructions Blood Thinner: No  OSA/CPAP: No  Covid test scheduled 12/23 at 1345  Anesthesia Review: Yes, cardiac history  Patient denies shortness of breath, fever, cough, and chest pain at PAT appointment.  Patient verbalized understanding of instructions provided today at the PAT appointment.  Patient asked to review instructions at home and day of surgery.

## 2021-11-23 ENCOUNTER — Encounter (HOSPITAL_COMMUNITY): Payer: Self-pay | Admitting: Physician Assistant

## 2021-11-24 ENCOUNTER — Telehealth: Payer: Self-pay | Admitting: *Deleted

## 2021-11-24 NOTE — Telephone Encounter (Signed)
° °  Pre-operative Risk Assessment    Patient Name: [name] [name]  DOB: [birth year] MRN: [id number]      Request for Surgical Clearance    Procedure:   RIGHT TOTAL KNEE ARTHROSCOPY  Date of Surgery:  Clearance 11/30/21  URGENT                               Surgeon:  DR. Jean Rosenthal Surgeon's Group or Practice Name:  [facility] Phone number:  [phone] Fax number:  [phone] ATTN: Dondra Spry   Type of Clearance Requested:   - Medical  - Pharmacy:  Hold Aspirin     Type of Anesthesia:  Spinal & Block   Additional requests/questions:    [name]   11/24/2021, 8:18 AM

## 2021-11-24 NOTE — Telephone Encounter (Signed)
I tried to call the pt in regard to needing an appt for pre op clearance. No answer and no vm came on. I also tried to call the surgeon's office though was on hold with no answer. I will fax notes to surgeon's office pt surgery will need to be postponed until cleared by cardiology. I was going to offer the pt an appt with DOD Dr. Debara Pickett 12/07/21 ok per Clarnce Flock RN for Dr. Debara Pickett ok to use time slot 12/07/21 1:30 7 day hold slot. If pt calls back and appt is still available, ok per [place] to use that time slot.

## 2021-11-24 NOTE — Telephone Encounter (Signed)
Primary Cardiologist:Mihai Croitoru, MD  Chart reviewed as part of pre-operative protocol coverage. Because of [name] Runkel Godeaux's past medical history and time since last visit, he/she will require a follow-up visit in order to better assess preoperative cardiovascular risk.  Pre-op covering staff: - Please schedule appointment and call patient to inform them. - Please contact requesting surgeon's office via preferred method (i.e, phone, fax) to inform them of need for appointment prior to surgery.  If applicable, this message will also be routed to pharmacy pool and/or primary cardiologist for input on holding anticoagulant/antiplatelet agent as requested below so that this information is available at time of patient's appointment.   Deberah Pelton, NP  11/24/2021, 8:42 AM

## 2021-11-25 NOTE — Telephone Encounter (Signed)
Pt has been scheduled to see Dr. Debara Pickett, 12/07/21, and clearance will be addressed at that time.   Will route back to the requesting surgeon's office to make them aware.

## 2021-11-26 ENCOUNTER — Other Ambulatory Visit (HOSPITAL_COMMUNITY): Payer: PPO

## 2021-11-26 ENCOUNTER — Other Ambulatory Visit: Payer: Self-pay | Admitting: Cardiovascular Disease

## 2021-11-30 ENCOUNTER — Encounter (HOSPITAL_COMMUNITY): Admission: RE | Payer: Self-pay | Source: Ambulatory Visit

## 2021-11-30 ENCOUNTER — Ambulatory Visit (HOSPITAL_COMMUNITY): Admission: RE | Admit: 2021-11-30 | Payer: PPO | Source: Ambulatory Visit | Admitting: Orthopaedic Surgery

## 2021-11-30 SURGERY — ARTHROPLASTY, KNEE, TOTAL
Anesthesia: Spinal | Site: Knee | Laterality: Right

## 2021-12-06 ENCOUNTER — Other Ambulatory Visit: Payer: Self-pay | Admitting: Cardiovascular Disease

## 2021-12-07 ENCOUNTER — Ambulatory Visit: Payer: PPO | Admitting: Internal Medicine

## 2021-12-07 ENCOUNTER — Other Ambulatory Visit: Payer: Self-pay

## 2021-12-07 ENCOUNTER — Encounter: Payer: Self-pay | Admitting: Internal Medicine

## 2021-12-07 VITALS — BP 132/68 | HR 63 | Ht 64.0 in | Wt 189.6 lb

## 2021-12-07 DIAGNOSIS — E782 Mixed hyperlipidemia: Secondary | ICD-10-CM | POA: Diagnosis not present

## 2021-12-07 DIAGNOSIS — Z01810 Encounter for preprocedural cardiovascular examination: Secondary | ICD-10-CM | POA: Diagnosis not present

## 2021-12-07 DIAGNOSIS — I10 Essential (primary) hypertension: Secondary | ICD-10-CM

## 2021-12-07 DIAGNOSIS — I2510 Atherosclerotic heart disease of native coronary artery without angina pectoris: Secondary | ICD-10-CM | POA: Diagnosis not present

## 2021-12-07 NOTE — Patient Instructions (Signed)
Medication Instructions:  Your physician recommends that you continue on your current medications as directed. Please refer to the Current Medication list given to you today.  *If you need a refill on your cardiac medications before your next appointment, please call your pharmacy*   Follow-Up: At [facility], you and your health needs are our priority.  As part of our continuing mission to provide you with exceptional heart care, we have created designated Provider Care Teams.  These Care Teams include your primary Cardiologist (physician) and Advanced Practice Providers (APPs -  Physician Assistants and Nurse Practitioners) who all work together to provide you with the care you need, when you need it.  We recommend signing up for the patient portal called "MyChart".  Sign up information is provided on this After Visit Summary.  MyChart is used to connect with patients for Virtual Visits (Telemedicine).  Patients are able to view lab/test results, encounter notes, upcoming appointments, etc.  Non-urgent messages can be sent to your provider as well.   To learn more about what you can do with MyChart, go to [email or web address].    Your next appointment:   April/May 2023 with Dr. Sallyanne Kuster (primary cardiologist)
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
## 2021-12-13 ENCOUNTER — Encounter (HOSPITAL_COMMUNITY): Payer: Self-pay | Admitting: Orthopaedic Surgery

## 2021-12-13 ENCOUNTER — Other Ambulatory Visit: Payer: Self-pay | Admitting: Physician Assistant

## 2021-12-13 ENCOUNTER — Other Ambulatory Visit: Payer: Self-pay

## 2021-12-13 DIAGNOSIS — M1711 Unilateral primary osteoarthritis, right knee: Secondary | ICD-10-CM

## 2021-12-13 NOTE — Progress Notes (Signed)
For Short Stay: COVID SWAB appointment date: arriving at 6:45 AM COVID testing in short stay (Could not come during COVID testing hours prior to procedure) Date of COVID positive in last 90 days: N/A   For Anesthesia: PCP - Prince Solian, MD Cardiologist - Pixie Casino, MD last office visit and cardiac clearance note 12/07/21 in epic  Chest x-ray - greater than 2 years in epic EKG - 12/07/21 in epic Stress Test - N/A ECHO - greater than 2 years in epic Cardiac Cath - greater than 2 years in epic Pacemaker/ICD device last checked: N/A Pacemaker orders received: N/A Device Rep notified: N/A  Sleep Study - N/A CPAP - N/A  Fasting Blood Sugar - N/A Checks Blood Sugar ___N/A__ times a day  Blood Thinner Instructions: N/A Aspirin Instructions: per pre op instructions Last Dose:per pre op instructions  Activity level: Can go up a flight of stairs and activities of daily living without stopping and without chest pain and/or shortness of breath   Anesthesia review: coronary artery disease with NSTEMI in 2018 status post PCI to the ostial ramus branch, CAD. HTN  Patient denies shortness of breath, fever, cough and chest pain at PAT appointment   Patient verbalized understanding of instructions that were given to them at the PAT appointment. Patient was also instructed that they will need to review over the PAT instructions again at home before surgery.

## 2021-12-14 ENCOUNTER — Encounter: Payer: PPO | Admitting: Orthopaedic Surgery
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
## 2021-12-14 NOTE — Anesthesia Preprocedure Evaluation (Addendum)
Anesthesia Evaluation  Patient identified by MRN, date of birth, ID band Patient awake    Reviewed: Allergy & Precautions, NPO status , Patient's Chart, lab work & pertinent test results, reviewed documented beta blocker date and time   Airway Mallampati: II  TM Distance: >3 FB Neck ROM: Full    Dental  (+) Teeth Intact, Dental Advisory Given, Caps   Pulmonary neg pulmonary ROS,    Pulmonary exam normal breath sounds clear to auscultation       Cardiovascular hypertension, Pt. on medications and Pt. on home beta blockers + CAD, + Past MI and + Cardiac Stents  Normal cardiovascular exam Rhythm:Regular Rate:Normal  Non STEMI 02/2017  Drug Eluding Stent Ramus Intermedius x 1   Neuro/Psych  Headaches, negative psych ROS   GI/Hepatic Neg liver ROS, GERD  Medicated,  Endo/Other  Obesity Hyperlipidemia  Renal/GU Renal diseaseHx/o renal calculi  negative genitourinary   Musculoskeletal  (+) Arthritis , Osteoarthritis,  DJD right knee   Abdominal (+) + obese,   Peds  Hematology negative hematology ROS (+)   Anesthesia Other Findings   Reproductive/Obstetrics                            Anesthesia Physical Anesthesia Plan  ASA: 3  Anesthesia Plan: Spinal   Post-op Pain Management: Regional block   Induction: Intravenous  PONV Risk Score and Plan: 3 and Treatment may vary due to age or medical condition and Propofol infusion  Airway Management Planned:   Additional Equipment:   Intra-op Plan:   Post-operative Plan:   Informed Consent: I have reviewed the patients History and Physical, chart, labs and discussed the procedure including the risks, benefits and alternatives for the proposed anesthesia with the patient or authorized representative who has indicated his/her understanding and acceptance.     Dental advisory given  Plan Discussed with: CRNA and Anesthesiologist  Anesthesia  Plan Comments: (See PAT note 11/22/2021, Konrad Felix Ward, PA-C)       Anesthesia Quick Evaluation

## 2021-12-14 NOTE — Addendum Note (Signed)
Addendum  created 12/14/21 1503 by Ward, Lenise Arena, PA-C   Clinical Note Signed

## 2021-12-16 ENCOUNTER — Telehealth: Payer: Self-pay | Admitting: *Deleted

## 2021-12-16 NOTE — Care Plan (Signed)
[place] office RNCM call to patient prior to her Right total knee arthroplasty with Dr. Ninfa Linden on 12/17/21. She is an Ortho bundle patient through [facility] and is agreeable to case management. She lives with her husband, who will be assisting at home after surgery. She has a RW and BSC/3in1. No DME needed. She has 5 steps to enter her single level home. Anticipate HHPT will be needed after a short hospital stay. Referral to [facility] after choice provided. Patient has chosen her OPPT at [facility] and this will be scheduled by CM when appropriate. Reviewed all post op care instructions. Will continue to follow for needs.

## 2021-12-16 NOTE — Telephone Encounter (Signed)
Ortho bundle pre-op call completed. 
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
## 2021-12-17 ENCOUNTER — Ambulatory Visit (HOSPITAL_COMMUNITY): Payer: PPO | Admitting: Anesthesiology

## 2021-12-17 ENCOUNTER — Other Ambulatory Visit: Payer: Self-pay

## 2021-12-17 ENCOUNTER — Observation Stay (HOSPITAL_COMMUNITY): Payer: PPO

## 2021-12-17 ENCOUNTER — Encounter (HOSPITAL_COMMUNITY): Payer: Self-pay | Admitting: Orthopaedic Surgery

## 2021-12-17 ENCOUNTER — Ambulatory Visit (HOSPITAL_COMMUNITY): Payer: PPO | Admitting: Physician Assistant

## 2021-12-17 ENCOUNTER — Encounter (HOSPITAL_COMMUNITY)
Admission: RE | Disposition: A | Discharge status: Home / Self Care | Payer: Self-pay | Source: Ambulatory Visit | Attending: Orthopaedic Surgery

## 2021-12-17 ENCOUNTER — Observation Stay (HOSPITAL_COMMUNITY)
Admission: RE | Admit: 2021-12-17 | Discharge: 2021-12-20 | Disposition: A | Discharge status: Home / Self Care | Payer: PPO | Source: Ambulatory Visit | Attending: Orthopaedic Surgery | Admitting: Orthopaedic Surgery

## 2021-12-17 ENCOUNTER — Other Ambulatory Visit: Payer: Self-pay | Admitting: *Deleted

## 2021-12-17 DIAGNOSIS — Z79899 Other long term (current) drug therapy: Secondary | ICD-10-CM | POA: Diagnosis not present

## 2021-12-17 DIAGNOSIS — M1711 Unilateral primary osteoarthritis, right knee: Secondary | ICD-10-CM | POA: Diagnosis not present

## 2021-12-17 DIAGNOSIS — Z471 Aftercare following joint replacement surgery: Secondary | ICD-10-CM | POA: Diagnosis not present

## 2021-12-17 DIAGNOSIS — Z96651 Presence of right artificial knee joint: Secondary | ICD-10-CM | POA: Diagnosis not present

## 2021-12-17 DIAGNOSIS — Z7982 Long term (current) use of aspirin: Secondary | ICD-10-CM | POA: Diagnosis not present

## 2021-12-17 DIAGNOSIS — Z20822 Contact with and (suspected) exposure to covid-19: Secondary | ICD-10-CM | POA: Insufficient documentation

## 2021-12-17 DIAGNOSIS — G8918 Other acute postprocedural pain: Secondary | ICD-10-CM | POA: Diagnosis not present

## 2021-12-17 DIAGNOSIS — I2510 Atherosclerotic heart disease of native coronary artery without angina pectoris: Secondary | ICD-10-CM | POA: Diagnosis not present

## 2021-12-17 DIAGNOSIS — I252 Old myocardial infarction: Secondary | ICD-10-CM | POA: Insufficient documentation

## 2021-12-17 DIAGNOSIS — I10 Essential (primary) hypertension: Secondary | ICD-10-CM | POA: Diagnosis not present

## 2021-12-17 HISTORY — PX: TOTAL KNEE ARTHROPLASTY: SHX125

## 2021-12-17 LAB — SARS CORONAVIRUS 2 BY RT PCR (HOSPITAL ORDER, PERFORMED IN ~~LOC~~ HOSPITAL LAB): SARS Coronavirus 2: NEGATIVE

## 2021-12-17 LAB — TYPE AND SCREEN
ABO/RH(D): O POS
Antibody Screen: NEGATIVE

## 2021-12-17 SURGERY — ARTHROPLASTY, KNEE, TOTAL
Anesthesia: Spinal | Site: Knee | Laterality: Right

## 2021-12-17 MED ORDER — MIDAZOLAM HCL 2 MG/2ML IJ SOLN
INTRAMUSCULAR | Status: AC
  Filled 2021-12-17: qty 2

## 2021-12-17 MED ORDER — ONDANSETRON HCL 4 MG PO TABS: 4.0000 mg | ORAL_TABLET | Freq: Four times a day (QID) | ORAL | Status: DC | PRN

## 2021-12-17 MED ORDER — PHENYLEPHRINE HCL-NACL 20-0.9 MG/250ML-% IV SOLN
INTRAVENOUS | Status: DC | PRN
  Administered 2021-12-17: 50 ug/min via INTRAVENOUS

## 2021-12-17 MED ORDER — ADULT MULTIVITAMIN W/MINERALS CH
1.0000 | ORAL_TABLET | Freq: Every day | ORAL | Status: DC
  Administered 2021-12-19 – 2021-12-20 (×2): 1 via ORAL
  Filled 2021-12-17 (×3): qty 1

## 2021-12-17 MED ORDER — EPHEDRINE 5 MG/ML INJ
INTRAVENOUS | Status: AC
  Filled 2021-12-17: qty 5

## 2021-12-17 MED ORDER — 0.9 % SODIUM CHLORIDE (POUR BTL) OPTIME
TOPICAL | Status: DC | PRN
  Administered 2021-12-17: 1000 mL

## 2021-12-17 MED ORDER — CEFAZOLIN SODIUM-DEXTROSE 2-4 GM/100ML-% IV SOLN
2.0000 g | INTRAVENOUS | Status: AC
  Administered 2021-12-17: 2 g via INTRAVENOUS
  Filled 2021-12-17: qty 100

## 2021-12-17 MED ORDER — IRBESARTAN 150 MG PO TABS
150.0000 mg | ORAL_TABLET | Freq: Every day | ORAL | Status: DC
  Administered 2021-12-18 – 2021-12-20 (×3): 150 mg via ORAL
  Filled 2021-12-17 (×3): qty 1

## 2021-12-17 MED ORDER — ORAL CARE MOUTH RINSE: 15.0000 mL | Freq: Once | OROMUCOSAL | Status: AC

## 2021-12-17 MED ORDER — PHENYLEPHRINE 40 MCG/ML (10ML) SYRINGE FOR IV PUSH (FOR BLOOD PRESSURE SUPPORT)
PREFILLED_SYRINGE | INTRAVENOUS | Status: AC
  Filled 2021-12-17: qty 10

## 2021-12-17 MED ORDER — HYDROCHLOROTHIAZIDE 12.5 MG PO TABS
12.5000 mg | ORAL_TABLET | Freq: Every day | ORAL | Status: DC
  Administered 2021-12-18 – 2021-12-20 (×3): 12.5 mg via ORAL
  Filled 2021-12-17 (×3): qty 1

## 2021-12-17 MED ORDER — ROPIVACAINE HCL 7.5 MG/ML IJ SOLN
INTRAMUSCULAR | Status: DC | PRN
  Administered 2021-12-17: 20 mL via PERINEURAL

## 2021-12-17 MED ORDER — POVIDONE-IODINE 10 % EX SWAB
2.0000 "application " | Freq: Once | CUTANEOUS | Status: AC
  Administered 2021-12-17: 2 via TOPICAL

## 2021-12-17 MED ORDER — CEFAZOLIN SODIUM-DEXTROSE 1-4 GM/50ML-% IV SOLN
1.0000 g | Freq: Four times a day (QID) | INTRAVENOUS | Status: AC
  Administered 2021-12-17 (×2): 1 g via INTRAVENOUS
  Filled 2021-12-17 (×2): qty 50

## 2021-12-17 MED ORDER — ROSUVASTATIN CALCIUM 20 MG PO TABS
20.0000 mg | ORAL_TABLET | Freq: Every morning | ORAL | Status: DC
  Administered 2021-12-18 – 2021-12-20 (×2): 20 mg via ORAL
  Filled 2021-12-17 (×3): qty 1

## 2021-12-17 MED ORDER — DOCUSATE SODIUM 100 MG PO CAPS
100.0000 mg | ORAL_CAPSULE | Freq: Two times a day (BID) | ORAL | Status: DC
  Administered 2021-12-17 – 2021-12-20 (×6): 100 mg via ORAL
  Filled 2021-12-17 (×6): qty 1

## 2021-12-17 MED ORDER — METOPROLOL SUCCINATE ER 25 MG PO TB24
25.0000 mg | ORAL_TABLET | Freq: Every day | ORAL | Status: DC
  Administered 2021-12-19 – 2021-12-20 (×2): 25 mg via ORAL
  Filled 2021-12-17 (×3): qty 1

## 2021-12-17 MED ORDER — HYDROMORPHONE HCL 1 MG/ML IJ SOLN: 0.2500 mg | INTRAMUSCULAR | Status: DC | PRN

## 2021-12-17 MED ORDER — PHENYLEPHRINE 40 MCG/ML (10ML) SYRINGE FOR IV PUSH (FOR BLOOD PRESSURE SUPPORT)
PREFILLED_SYRINGE | INTRAVENOUS | Status: DC | PRN
  Administered 2021-12-17 (×4): 80 ug via INTRAVENOUS

## 2021-12-17 MED ORDER — PROPOFOL 10 MG/ML IV BOLUS
INTRAVENOUS | Status: DC | PRN
  Administered 2021-12-17: 50 mg via INTRAVENOUS

## 2021-12-17 MED ORDER — SODIUM CHLORIDE 0.9 % IR SOLN
Status: DC | PRN
  Administered 2021-12-17: 1000 mL

## 2021-12-17 MED ORDER — NITROGLYCERIN 0.4 MG SL SUBL: 0.4000 mg | SUBLINGUAL_TABLET | SUBLINGUAL | Status: DC | PRN

## 2021-12-17 MED ORDER — PHENOL 1.4 % MT LIQD: 1.0000 | OROMUCOSAL | Status: DC | PRN

## 2021-12-17 MED ORDER — METOCLOPRAMIDE HCL 5 MG PO TABS: 5.0000 mg | ORAL_TABLET | Freq: Three times a day (TID) | ORAL | Status: DC | PRN

## 2021-12-17 MED ORDER — EZETIMIBE 10 MG PO TABS
10.0000 mg | ORAL_TABLET | Freq: Every day | ORAL | Status: DC
  Administered 2021-12-18 – 2021-12-20 (×3): 10 mg via ORAL
  Filled 2021-12-17 (×3): qty 1

## 2021-12-17 MED ORDER — FENOFIBRATE 160 MG PO TABS
160.0000 mg | ORAL_TABLET | Freq: Every day | ORAL | Status: DC
Start: 2021-12-18 — End: 2021-12-20
  Administered 2021-12-18 – 2021-12-20 (×3): 160 mg via ORAL
  Filled 2021-12-17 (×4): qty 1

## 2021-12-17 MED ORDER — PROPOFOL 500 MG/50ML IV EMUL
INTRAVENOUS | Status: DC | PRN
  Administered 2021-12-17: 100 ug/kg/min via INTRAVENOUS

## 2021-12-17 MED ORDER — METOCLOPRAMIDE HCL 5 MG/ML IJ SOLN: 5.0000 mg | Freq: Three times a day (TID) | INTRAMUSCULAR | Status: DC | PRN

## 2021-12-17 MED ORDER — ASPIRIN 81 MG PO CHEW
81.0000 mg | CHEWABLE_TABLET | Freq: Two times a day (BID) | ORAL | Status: DC
  Administered 2021-12-17 – 2021-12-20 (×6): 81 mg via ORAL
  Filled 2021-12-17 (×6): qty 1

## 2021-12-17 MED ORDER — DEXAMETHASONE SODIUM PHOSPHATE 10 MG/ML IJ SOLN
INTRAMUSCULAR | Status: DC | PRN
  Administered 2021-12-17: 10 mg via INTRAVENOUS

## 2021-12-17 MED ORDER — IRBESARTAN-HYDROCHLOROTHIAZIDE 150-12.5 MG PO TABS: 1.0000 | ORAL_TABLET | Freq: Every day | ORAL | Status: DC

## 2021-12-17 MED ORDER — METHOCARBAMOL 1000 MG/10ML IJ SOLN
500.0000 mg | Freq: Four times a day (QID) | INTRAVENOUS | Status: DC | PRN
  Filled 2021-12-17: qty 5

## 2021-12-17 MED ORDER — ONDANSETRON HCL 4 MG/2ML IJ SOLN: 4.0000 mg | Freq: Four times a day (QID) | INTRAMUSCULAR | Status: DC | PRN

## 2021-12-17 MED ORDER — LACTATED RINGERS IV SOLN: INTRAVENOUS | Status: DC

## 2021-12-17 MED ORDER — DULOXETINE HCL 30 MG PO CPEP
30.0000 mg | ORAL_CAPSULE | Freq: Every day | ORAL | Status: DC
  Administered 2021-12-18 – 2021-12-20 (×3): 30 mg via ORAL
  Filled 2021-12-17 (×3): qty 1

## 2021-12-17 MED ORDER — FENTANYL CITRATE PF 50 MCG/ML IJ SOSY
50.0000 ug | PREFILLED_SYRINGE | INTRAMUSCULAR | Status: DC
  Administered 2021-12-17 (×2): 50 ug via INTRAVENOUS
  Filled 2021-12-17: qty 2

## 2021-12-17 MED ORDER — MAGNESIUM OXIDE -MG SUPPLEMENT 400 (240 MG) MG PO TABS
400.0000 mg | ORAL_TABLET | Freq: Every day | ORAL | Status: DC
  Administered 2021-12-18 – 2021-12-20 (×3): 400 mg via ORAL
  Filled 2021-12-17 (×3): qty 1

## 2021-12-17 MED ORDER — MIDAZOLAM HCL 2 MG/2ML IJ SOLN
1.0000 mg | INTRAMUSCULAR | Status: DC
  Filled 2021-12-17: qty 2

## 2021-12-17 MED ORDER — EPHEDRINE SULFATE-NACL 50-0.9 MG/10ML-% IV SOSY
PREFILLED_SYRINGE | INTRAVENOUS | Status: DC | PRN
  Administered 2021-12-17: 5 mg via INTRAVENOUS
  Administered 2021-12-17: 10 mg via INTRAVENOUS
  Administered 2021-12-17: 5 mg via INTRAVENOUS

## 2021-12-17 MED ORDER — FENTANYL CITRATE (PF) 100 MCG/2ML IJ SOLN
INTRAMUSCULAR | Status: AC
  Filled 2021-12-17: qty 2

## 2021-12-17 MED ORDER — ALUM & MAG HYDROXIDE-SIMETH 200-200-20 MG/5ML PO SUSP
30.0000 mL | ORAL | Status: DC | PRN
Start: 2021-12-17 — End: 2021-12-20

## 2021-12-17 MED ORDER — ACETAMINOPHEN 325 MG PO TABS: 325.0000 mg | ORAL_TABLET | Freq: Four times a day (QID) | ORAL | Status: DC | PRN

## 2021-12-17 MED ORDER — MENTHOL 3 MG MT LOZG: 1.0000 | LOZENGE | OROMUCOSAL | Status: DC | PRN

## 2021-12-17 MED ORDER — CHLORHEXIDINE GLUCONATE 0.12 % MT SOLN
15.0000 mL | Freq: Once | OROMUCOSAL | Status: AC
  Administered 2021-12-17: 15 mL via OROMUCOSAL

## 2021-12-17 MED ORDER — OXYCODONE HCL 5 MG PO TABS
5.0000 mg | ORAL_TABLET | ORAL | Status: DC | PRN
  Filled 2021-12-17 (×9): qty 2

## 2021-12-17 MED ORDER — ONDANSETRON HCL 4 MG/2ML IJ SOLN
4.0000 mg | Freq: Once | INTRAMUSCULAR | Status: AC | PRN
  Administered 2021-12-17: 4 mg via INTRAVENOUS

## 2021-12-17 MED ORDER — METHOCARBAMOL 500 MG PO TABS
500.0000 mg | ORAL_TABLET | Freq: Four times a day (QID) | ORAL | Status: DC | PRN
  Administered 2021-12-17 – 2021-12-20 (×9): 500 mg via ORAL
  Filled 2021-12-17 (×9): qty 1

## 2021-12-17 MED ORDER — OXYCODONE HCL 5 MG PO TABS: 5.0000 mg | ORAL_TABLET | Freq: Once | ORAL | Status: DC | PRN

## 2021-12-17 MED ORDER — PANTOPRAZOLE SODIUM 40 MG PO TBEC
40.0000 mg | DELAYED_RELEASE_TABLET | Freq: Every day | ORAL | Status: DC
Start: 2021-12-18 — End: 2021-12-20
  Administered 2021-12-18 – 2021-12-20 (×3): 40 mg via ORAL
  Filled 2021-12-17 (×3): qty 1

## 2021-12-17 MED ORDER — OXYCODONE HCL 5 MG PO TABS
10.0000 mg | ORAL_TABLET | ORAL | Status: DC | PRN
  Administered 2021-12-17: 15 mg via ORAL
  Administered 2021-12-17 – 2021-12-20 (×14): 10 mg via ORAL
  Filled 2021-12-17 (×3): qty 2
  Filled 2021-12-17: qty 3
  Filled 2021-12-17 (×2): qty 2

## 2021-12-17 MED ORDER — TRANEXAMIC ACID-NACL 1000-0.7 MG/100ML-% IV SOLN
1000.0000 mg | INTRAVENOUS | Status: AC
  Administered 2021-12-17: 1000 mg via INTRAVENOUS
  Filled 2021-12-17: qty 100

## 2021-12-17 MED ORDER — OXYCODONE HCL 5 MG/5ML PO SOLN: 5.0000 mg | Freq: Once | ORAL | Status: DC | PRN

## 2021-12-17 MED ORDER — HYDROMORPHONE HCL 1 MG/ML IJ SOLN
0.5000 mg | INTRAMUSCULAR | Status: DC | PRN
  Administered 2021-12-17 – 2021-12-19 (×5): 1 mg via INTRAVENOUS
  Filled 2021-12-17 (×5): qty 1

## 2021-12-17 MED ORDER — SODIUM CHLORIDE 0.9 % IV SOLN: INTRAVENOUS | Status: DC

## 2021-12-17 SURGICAL SUPPLY — 67 items
APL SKNCLS STERI-STRIP NONHPOA (GAUZE/BANDAGES/DRESSINGS)
BAG COUNTER SPONGE SURGICOUNT (BAG) IMPLANT
BAG SPEC THK2 15X12 ZIP CLS (MISCELLANEOUS) ×1
BAG SPNG CNTER NS LX DISP (BAG)
BAG ZIPLOCK 12X15 (MISCELLANEOUS) ×3 IMPLANT
BENZOIN TINCTURE PRP APPL 2/3 (GAUZE/BANDAGES/DRESSINGS) IMPLANT
BLADE SAG 18X100X1.27 (BLADE) ×3 IMPLANT
BLADE SURG SZ10 CARB STEEL (BLADE) ×6 IMPLANT
BNDG CMPR MED 10X6 ELC LF (GAUZE/BANDAGES/DRESSINGS) ×1
BNDG ELASTIC 6X10 VLCR STRL LF (GAUZE/BANDAGES/DRESSINGS) ×1 IMPLANT
BNDG ELASTIC 6X5.8 VLCR STR LF (GAUZE/BANDAGES/DRESSINGS) ×6 IMPLANT
BOWL SMART MIX CTS (DISPOSABLE) ×1 IMPLANT
BSPLAT TIB 5D E CMNT KN RT (Knees) ×1 IMPLANT
CEMENT BONE R 1X40 (Cement) ×2 IMPLANT
COOLER ICEMAN CLASSIC (MISCELLANEOUS) ×3 IMPLANT
COVER SURGICAL LIGHT HANDLE (MISCELLANEOUS) ×3 IMPLANT
CUFF TOURN SGL QUICK 34 (TOURNIQUET CUFF) ×2
CUFF TRNQT CYL 34X4.125X (TOURNIQUET CUFF) ×2 IMPLANT
DECANTER SPIKE VIAL GLASS SM (MISCELLANEOUS) IMPLANT
DRAPE INCISE IOBAN 66X45 STRL (DRAPES) ×3 IMPLANT
DRAPE U-SHAPE 47X51 STRL (DRAPES) ×3 IMPLANT
DRSG PAD ABDOMINAL 8X10 ST (GAUZE/BANDAGES/DRESSINGS) ×5 IMPLANT
DURAPREP 26ML APPLICATOR (WOUND CARE) ×3 IMPLANT
ELECT BLADE TIP CTD 4 INCH (ELECTRODE) ×3 IMPLANT
ELECT REM PT RETURN 15FT ADLT (MISCELLANEOUS) ×3 IMPLANT
FEMUR CMT CR STD SZ 6 RT KNEE (Joint) ×2 IMPLANT
FEMUR CMTD CR STD SZ 6 RT KNEE (Joint) IMPLANT
GAUZE SPONGE 4X4 12PLY STRL (GAUZE/BANDAGES/DRESSINGS) ×3 IMPLANT
GAUZE XEROFORM 1X8 LF (GAUZE/BANDAGES/DRESSINGS) ×1 IMPLANT
GLOVE SRG 8 PF TXTR STRL LF DI (GLOVE) ×4 IMPLANT
GLOVE SURG ENC MOIS LTX SZ7.5 (GLOVE) ×3 IMPLANT
GLOVE SURG NEOPR MICRO LF SZ8 (GLOVE) ×3 IMPLANT
GLOVE SURG UNDER POLY LF SZ8 (GLOVE) ×4
GOWN STRL REUS W/TWL XL LVL3 (GOWN DISPOSABLE) ×6 IMPLANT
HANDPIECE INTERPULSE COAX TIP (DISPOSABLE) ×2
HDLS TROCR DRIL PIN KNEE 75 (PIN) ×8
HOLDER FOLEY CATH W/STRAP (MISCELLANEOUS) ×1 IMPLANT
IMMOBILIZER KNEE 20 (SOFTGOODS) ×2
IMMOBILIZER KNEE 20 THIGH 36 (SOFTGOODS) ×2 IMPLANT
INSERT TIB ARTISURF SZ6-7 (Insert) ×1 IMPLANT
KIT TURNOVER KIT A (KITS) IMPLANT
NS IRRIG 1000ML POUR BTL (IV SOLUTION) ×3 IMPLANT
PACK TOTAL KNEE CUSTOM (KITS) ×3 IMPLANT
PAD COLD SHLDR WRAP-ON (PAD) ×3 IMPLANT
PADDING CAST COTTON 6X4 STRL (CAST SUPPLIES) ×6 IMPLANT
PADDING CAST SYN 6 (CAST SUPPLIES) ×1
PADDING CAST SYNTHETIC 6X4 NS (CAST SUPPLIES) IMPLANT
PIN DRILL HDLS TROCAR 75 4PK (PIN) IMPLANT
PROTECTOR NERVE ULNAR (MISCELLANEOUS) ×3 IMPLANT
SCREW FEMALE HEX FIX 25X2.5 (ORTHOPEDIC DISPOSABLE SUPPLIES) ×1 IMPLANT
SET HNDPC FAN SPRY TIP SCT (DISPOSABLE) ×2 IMPLANT
SET PAD KNEE POSITIONER (MISCELLANEOUS) ×3 IMPLANT
SPONGE T-LAP 18X18 ~~LOC~~+RFID (SPONGE) ×9 IMPLANT
STAPLER VISISTAT 35W (STAPLE) IMPLANT
STEM POLY PAT PLY 29M KNEE (Knees) ×1 IMPLANT
STEM TIBIA 5 DEG SZ E R KNEE (Knees) IMPLANT
STRIP CLOSURE SKIN 1/2X4 (GAUZE/BANDAGES/DRESSINGS) IMPLANT
SUT MNCRL AB 4-0 PS2 18 (SUTURE) IMPLANT
SUT VIC AB 0 CT1 27 (SUTURE) ×2
SUT VIC AB 0 CT1 27XBRD ANTBC (SUTURE) ×2 IMPLANT
SUT VIC AB 1 CT1 36 (SUTURE) ×6 IMPLANT
SUT VIC AB 2-0 CT1 27 (SUTURE) ×4
SUT VIC AB 2-0 CT1 TAPERPNT 27 (SUTURE) ×4 IMPLANT
TIBIA STEM 5 DEG SZ E R KNEE (Knees) ×2 IMPLANT
TRAY FOLEY MTR SLVR 14FR STAT (SET/KITS/TRAYS/PACK) ×1 IMPLANT
TRAY FOLEY MTR SLVR 16FR STAT (SET/KITS/TRAYS/PACK) IMPLANT
WATER STERILE IRR 1000ML POUR (IV SOLUTION) ×6 IMPLANT

## 2021-12-17 NOTE — Transfer of Care (Signed)
Immediate Anesthesia Transfer of Care Note  Patient: [name] [name]  Procedure(s) Performed: RIGHT TOTAL KNEE ARTHROPLASTY (Right: Knee)  Patient Location: PACU  Anesthesia Type:Spinal  Level of Consciousness: awake and patient cooperative  Airway & Oxygen Therapy: Patient Spontanous Breathing and Patient connected to nasal cannula oxygen  Post-op Assessment: Report given to RN and Post -op Vital signs reviewed and stable  Post vital signs: Reviewed and stable  Last Vitals:  Vitals Value Taken Time  BP 110/54 12/17/21 1226  Temp    Pulse    Resp 14 12/17/21 1228  SpO2    Vitals shown include unvalidated device data.  Last Pain:  Vitals:   12/17/21 1005  TempSrc:   PainSc: 0-No pain      Patients Stated Pain Goal: 4 (32/20/25 4270)  Complications: No notable events documented.

## 2021-12-17 NOTE — Evaluation (Signed)
Physical Therapy Evaluation Patient Details Name: [name] [name] MRN: [id number] DOB: 1951/04/04 Today's Date: 12/17/2021  History of Present Illness  Pt s/p R TKR and with hx of chronic LBP, CAD and MI  Clinical Impression  Pt s/p R TKR and presents with decreased R LE strength/ROM and post op pain limiting functional mobility.  Pt should progress to dc home with family assist and reports plan is for HHPT follow up.     Recommendations for follow up therapy are one component of a multi-disciplinary discharge planning process, led by the attending physician.  Recommendations may be updated based on patient status, additional functional criteria and insurance authorization.  Follow Up Recommendations Follow physician's recommendations for discharge plan and follow up therapies    Assistance Recommended at Discharge Intermittent Supervision/Assistance  Patient can return home with the following  A little help with walking and/or transfers;A little help with bathing/dressing/bathroom;Assistance with cooking/housework;Help with stairs or ramp for entrance    Equipment Recommendations None recommended by PT  Recommendations for Other Services       Functional Status Assessment Patient has had a recent decline in their functional status and demonstrates the ability to make significant improvements in function in a reasonable and predictable amount of time.     Precautions / Restrictions Precautions Precautions: Fall;Knee Required Braces or Orthoses: Knee Immobilizer - Right Knee Immobilizer - Right: Discontinue once straight leg raise with < 10 degree lag Restrictions Weight Bearing Restrictions: No Other Position/Activity Restrictions: WBAT      Mobility  Bed Mobility Overal bed mobility: Needs Assistance Bed Mobility: Supine to Sit     Supine to sit: Min assist     General bed mobility comments: cues for sequence and use of L LE to self assist    Transfers Overall  transfer level: Needs assistance Equipment used: Rolling walker (2 wheels) Transfers: Sit to/from Stand Sit to Stand: Min assist           General transfer comment: cues for LE management and use of UEs to self assist    Ambulation/Gait Ambulation/Gait assistance: Min assist Gait Distance (Feet): 50 Feet Assistive device: Rolling walker (2 wheels) Gait Pattern/deviations: Step-to pattern;Decreased step length - right;Decreased step length - left;Shuffle;Trunk flexed Gait velocity: decr     General Gait Details: cues for sequenc, posture and position from [employer]            Wheelchair Mobility    Modified Rankin (Stroke Patients Only)       Balance Overall balance assessment: Needs assistance Sitting-balance support: No upper extremity supported;Feet supported Sitting balance-Leahy Scale: Good     Standing balance support: Bilateral upper extremity supported Standing balance-Leahy Scale: Poor                               Pertinent Vitals/Pain Pain Assessment: 0-10 Pain Score: 5  Pain Location: R knee Pain Descriptors / Indicators: Aching;Sore Pain Intervention(s): Limited activity within patient's tolerance;Monitored during session;Premedicated before session;Ice applied    Home Living Family/patient expects to be discharged to:: Private residence Living Arrangements: Spouse/significant other Available Help at Discharge: Family Type of Home: House Home Access: Stairs to enter Entrance Stairs-Rails: Right Entrance Stairs-Number of Steps: 5   Home Layout: One level Home Equipment: Conservation officer, nature (2 wheels);Cane - single point;BSC/3in1      Prior Function Prior Level of Function : Independent/Modified Independent  Hand Dominance        Extremity/Trunk Assessment   Upper Extremity Assessment Upper Extremity Assessment: Overall WFL for tasks assessed    Lower Extremity Assessment Lower Extremity  Assessment: RLE deficits/detail    Cervical / Trunk Assessment Cervical / Trunk Assessment: Normal  Communication   Communication: No difficulties  Cognition Arousal/Alertness: Awake/alert Behavior During Therapy: WFL for tasks assessed/performed Overall Cognitive Status: Within Functional Limits for tasks assessed                                          General Comments      Exercises Total Joint Exercises Ankle Circles/Pumps: AROM;Both;15 reps;Supine   Assessment/Plan    PT Assessment Patient needs continued PT services  PT Problem List Decreased strength;Decreased range of motion;Decreased activity tolerance;Decreased balance;Decreased mobility;Decreased knowledge of use of DME;Pain       PT Treatment Interventions DME instruction;Gait training;Functional mobility training;Stair training;Therapeutic activities;Therapeutic exercise;Patient/family education    PT Goals (Current goals can be found in the Care Plan section)  Acute Rehab PT Goals Patient Stated Goal: REgain IND PT Goal Formulation: With patient Time For Goal Achievement: 12/24/21 Potential to Achieve Goals: Good    Frequency 7X/week     Co-evaluation               AM-PAC PT "6 Clicks" Mobility  Outcome Measure Help needed turning from your back to your side while in a flat bed without using bedrails?: A Little Help needed moving from lying on your back to sitting on the side of a flat bed without using bedrails?: A Little Help needed moving to and from a bed to a chair (including a wheelchair)?: A Little Help needed standing up from a chair using your arms (e.g., wheelchair or bedside chair)?: A Little Help needed to walk in hospital room?: A Little Help needed climbing 3-5 steps with a railing? : A Lot 6 Click Score: 17    End of Session Equipment Utilized During Treatment: Gait belt;Right knee immobilizer Activity Tolerance: Patient tolerated treatment well Patient left:  in chair;with call bell/phone within reach;with chair alarm set;with family/visitor present Nurse Communication: Mobility status PT Visit Diagnosis: Difficulty in walking, not elsewhere classified (R26.2)    Time: 6203-5597 PT Time Calculation (min) (ACUTE ONLY): 34 min   Charges:   PT Evaluation $PT Eval Low Complexity: 1 Low PT Treatments $Gait Training: 8-22 mins        [place] Pager [phone] Office [phone]   Florence Yeung 12/17/2021, 4:12 PM

## 2021-12-17 NOTE — Anesthesia Procedure Notes (Signed)
Anesthesia Regional Block: Adductor canal block   Pre-Anesthetic Checklist: , timeout performed,  Correct Patient, Correct Site, Correct Laterality,  Correct Procedure, Correct Position, site marked,  Risks and benefits discussed,  Surgical consent,  Pre-op evaluation,  At surgeon's request and post-op pain management  Laterality: Right  Prep: chloraprep       Needles:  Injection technique: Single-shot  Needle Type: Echogenic Stimulator Needle     Needle Length: 10cm  Needle Gauge: 21   Needle insertion depth: 7 cm   Additional Needles:   Procedures:,,,, ultrasound used (permanent image in chart),,    Narrative:  Start time: 12/17/2021 9:41 AM End time: 12/17/2021 9:46 AM Injection made incrementally with aspirations every 5 mL.  Performed by: Personally  Anesthesiologist: Josephine Igo, MD  Additional Notes: Timeout performed. Patient sedated. Relevant anatomy ID'd using [place]. Incremental 2-22ml injection of LA with frequent aspiration. Patient tolerated procedure well.    Right Adductor Canal Block

## 2021-12-17 NOTE — Progress Notes (Signed)
AssistedDr. Foster with right, ultrasound guided, adductor canal block. Side rails up, monitors on throughout procedure. See vital signs in flow sheet. Tolerated Procedure well.  

## 2021-12-17 NOTE — Anesthesia Postprocedure Evaluation (Signed)
Anesthesia Post Note  Patient: [name] [name]  Procedure(s) Performed: RIGHT TOTAL KNEE ARTHROPLASTY (Right: Knee)     Patient location during evaluation: PACU Anesthesia Type: Spinal Level of consciousness: oriented and awake and alert Pain management: pain level controlled Vital Signs Assessment: post-procedure vital signs reviewed and stable Respiratory status: spontaneous breathing, respiratory function stable and nonlabored ventilation Cardiovascular status: blood pressure returned to baseline and stable Postop Assessment: no headache, no backache, no apparent nausea or vomiting, spinal receding and patient able to bend at knees Anesthetic complications: no   No notable events documented.  Last Vitals:  Vitals:   12/17/21 1245 12/17/21 1300  BP: 111/61 (!) 106/57  Pulse: 60 62  Resp: 12 12  Temp:    SpO2: 98% 98%    Last Pain:  Vitals:   12/17/21 1230  TempSrc:   PainSc: 0-No pain                 Kaisen Ackers A.

## 2021-12-17 NOTE — Anesthesia Procedure Notes (Signed)
Procedure Name: MAC Date/Time: 12/17/2021 10:30 AM Performed by: Josephine Igo, CRNA Pre-anesthesia Checklist: Patient identified, Emergency Drugs available and Suction available Oxygen Delivery Method: Simple face mask Placement Confirmation: positive ETCO2

## 2021-12-17 NOTE — Brief Op Note (Signed)
12/17/2021  12:04 PM  PATIENT:  [name] [name]  71 y.o. female  PRE-OPERATIVE DIAGNOSIS:  right knee osteoarthritis  POST-OPERATIVE DIAGNOSIS:  right knee osteoarthritis  PROCEDURE:  Procedure(s): RIGHT TOTAL KNEE ARTHROPLASTY (Right)  SURGEON:  Surgeon(s) and Role:    Mcarthur Rossetti, MD - Primary  PHYSICIAN ASSISTANT:  Benita Stabile, PA-C  ANESTHESIA:   regional and spinal  COUNTS:  YES  TOURNIQUET:   Total Tourniquet Time Documented: Thigh (Right) - 48 minutes Total: Thigh (Right) - 48 minutes   DICTATION: .Other Dictation: Dictation Number [id number]  PLAN OF CARE: Admit for overnight observation  PATIENT DISPOSITION:  PACU - hemodynamically stable.   Delay start of Pharmacological VTE agent (>24hrs) due to surgical blood loss or risk of bleeding: no

## 2021-12-17 NOTE — Interval H&P Note (Signed)
History and Physical Interval Note: The patient understands that she is here today for a right total knee replacement to treat her right knee osteoarthritis.  There has been no acute or interval change in her medical status.  Please see recent H&P.  The risks and benefits of surgery been discussed in detail and informed consent is obtained.  The right knee has been marked.  12/17/2021 9:11 AM  [name] [name]  has presented today for surgery, with the diagnosis of right knee osteoarthritis.  The various methods of treatment have been discussed with the patient and family. After consideration of risks, benefits and other options for treatment, the patient has consented to  Procedure(s): RIGHT TOTAL KNEE ARTHROPLASTY (Right) as a surgical intervention.  The patient's history has been reviewed, patient examined, no change in status, stable for surgery.  I have reviewed the patient's chart and labs.  Questions were answered to the patient's satisfaction.     Mcarthur Rossetti

## 2021-12-17 NOTE — Care Plan (Signed)
Ortho Bundle Case Management Note  Patient Details  Name: [name] [name] MRN: [id number] Date of Birth: 05-12-1951   [facility] office RNCM call to patient prior to her Right total knee arthroplasty with Dr. Ninfa Linden on 12/17/21. She is an Ortho bundle patient through [facility] and is agreeable to case management. She lives with her husband, who will be assisting at home after surgery. She has a RW and BSC/3in1. No DME needed. She has 5 steps to enter her single level home. Anticipate HHPT will be needed after a short hospital stay. Referral to [facility] after choice provided. Patient has chosen her OPPT at [facility] and this will be scheduled by CM when appropriate. Reviewed all post op care instructions. Will continue to follow for needs.                  DME Arranged:   (Patient has all DME (RW, BSC/3in1)) DME Agency:     HH Arranged:  PT HH Agency:  [place]  Additional Comments: Please contact me with any questions of if this plan should need to change.  Jamse Arn, RN, BSN, [employer]  [phone] 12/17/2021, 2:12 PM

## 2021-12-18 DIAGNOSIS — M1711 Unilateral primary osteoarthritis, right knee: Secondary | ICD-10-CM | POA: Diagnosis not present

## 2021-12-18 LAB — BASIC METABOLIC PANEL
Anion gap: 5 (ref 5–15)
BUN: 16 mg/dL (ref 8–23)
CO2: 27 mmol/L (ref 22–32)
Calcium: 8.8 mg/dL — ABNORMAL LOW (ref 8.9–10.3)
Chloride: 106 mmol/L (ref 98–111)
Creatinine, Ser: 0.83 mg/dL (ref 0.44–1.00)
GFR, Estimated: >60 mL/min (ref >60)
Glucose, Bld: 143 mg/dL — ABNORMAL HIGH (ref 70–99)
Potassium: 3.9 mmol/L (ref 3.5–5.1)
Sodium: 138 mmol/L (ref 135–145)

## 2021-12-18 LAB — CBC
HCT: 37.1 % (ref 36.0–46.0)
Hemoglobin: 11.9 g/dL — ABNORMAL LOW (ref 12.0–15.0)
MCH: 30.4 pg (ref 26.0–34.0)
MCHC: 32.1 g/dL (ref 30.0–36.0)
MCV: 94.9 fL (ref 80.0–100.0)
Platelets: 210 10*3/uL (ref 150–400)
RBC: 3.91 MIL/uL (ref 3.87–5.11)
RDW: 13.2 % (ref 11.5–15.5)
WBC: 8.3 10*3/uL (ref 4.0–10.5)
nRBC: 0 % (ref 0.0–0.2)

## 2021-12-18 MED ORDER — OXYCODONE HCL 5 MG PO TABS: 5.0000 mg | ORAL_TABLET | ORAL | 0 refills | Status: DC | PRN

## 2021-12-18 MED ORDER — METHOCARBAMOL 500 MG PO TABS: 500.0000 mg | ORAL_TABLET | Freq: Four times a day (QID) | ORAL | 1 refills | Status: DC | PRN

## 2021-12-18 MED ORDER — ASPIRIN 81 MG PO CHEW: 81.0000 mg | CHEWABLE_TABLET | Freq: Two times a day (BID) | ORAL | 0 refills | Status: DC

## 2021-12-18 NOTE — Progress Notes (Signed)
Physical Therapy Treatment Patient Details Name: [name] [name] MRN: [id number] DOB: 11-27-1951 Today's Date: 12/18/2021   History of Present Illness Pt s/p R TKR and with hx of chronic LBP, CAD and MI    PT Comments    Pt continues cooperative but pain limited this pm despite premed.  Pt requiring increased time for all tasks but tolerated ambulation to/from bathroom and performing hygiene at sink before being assisted to bed.    Recommendations for follow up therapy are one component of a multi-disciplinary discharge planning process, led by the attending physician.  Recommendations may be updated based on patient status, additional functional criteria and insurance authorization.  Follow Up Recommendations  Follow physician's recommendations for discharge plan and follow up therapies     Assistance Recommended at Discharge Intermittent Supervision/Assistance  Patient can return home with the following A little help with walking and/or transfers;A little help with bathing/dressing/bathroom;Assistance with cooking/housework;Help with stairs or ramp for entrance   Equipment Recommendations  None recommended by PT    Recommendations for Other Services       Precautions / Restrictions Precautions Precautions: Fall;Knee Required Braces or Orthoses: Knee Immobilizer - Right Knee Immobilizer - Right: Discontinue once straight leg raise with < 10 degree lag Restrictions Weight Bearing Restrictions: No Other Position/Activity Restrictions: WBAT     Mobility  Bed Mobility Overal bed mobility: Needs Assistance Bed Mobility: Sit to Supine     Supine to sit: Min assist Sit to supine: Min assist   General bed mobility comments: Increased time with cues for sequence and use of L LE to self assist    Transfers Overall transfer level: Needs assistance Equipment used: Rolling walker (2 wheels) Transfers: Sit to/from Stand Sit to Stand: Min assist           General transfer  comment: cues for LE management and use of UEs to self assist    Ambulation/Gait Ambulation/Gait assistance: Min assist;Min guard Gait Distance (Feet): 15 Feet (15' twice to/from bathroom) Assistive device: Rolling walker (2 wheels) Gait Pattern/deviations: Step-to pattern;Decreased step length - right;Decreased step length - left;Shuffle;Trunk flexed Gait velocity: decr     General Gait Details: cues for sequenc, posture and position from [employer]             Wheelchair Mobility    Modified Rankin (Stroke Patients Only)       Balance Overall balance assessment: Needs assistance Sitting-balance support: No upper extremity supported;Feet supported Sitting balance-Leahy Scale: Good     Standing balance support: Single extremity supported Standing balance-Leahy Scale: Poor                              Cognition Arousal/Alertness: Awake/alert Behavior During Therapy: WFL for tasks assessed/performed Overall Cognitive Status: Within Functional Limits for tasks assessed                                          Exercises Total Joint Exercises Ankle Circles/Pumps: AROM;Both;15 reps;Supine Quad Sets: AROM;Both;10 reps;Supine Heel Slides: AAROM;10 reps;Supine;Right Straight Leg Raises: AAROM;10 reps;Supine;Right Goniometric ROM: -5 - 35 AAROM R knee    General Comments        Pertinent Vitals/Pain Pain Assessment: 0-10 Pain Score: 8  Pain Location: R knee Pain Descriptors / Indicators: Aching;Sore Pain Intervention(s): Limited activity within patient's tolerance;Monitored during session;Premedicated before  session    Home Living                          Prior Function            PT Goals (current goals can now be found in the care plan section) Acute Rehab PT Goals Patient Stated Goal: REgain IND PT Goal Formulation: With patient Time For Goal Achievement: 12/24/21 Potential to Achieve Goals: Good Progress  towards PT goals: Not progressing toward goals - comment (pain limited)    Frequency    7X/week      PT Plan Current plan remains appropriate    Co-evaluation              AM-PAC PT "6 Clicks" Mobility   Outcome Measure  Help needed turning from your back to your side while in a flat bed without using bedrails?: A Little Help needed moving from lying on your back to sitting on the side of a flat bed without using bedrails?: A Little Help needed moving to and from a bed to a chair (including a wheelchair)?: A Little Help needed standing up from a chair using your arms (e.g., wheelchair or bedside chair)?: A Little Help needed to walk in hospital room?: A Little Help needed climbing 3-5 steps with a railing? : A Lot 6 Click Score: 17    End of Session Equipment Utilized During Treatment: Gait belt;Right knee immobilizer Activity Tolerance: Patient tolerated treatment well;Patient limited by pain Patient left: in bed;with call bell/phone within reach;with family/visitor present Nurse Communication: Mobility status PT Visit Diagnosis: Difficulty in walking, not elsewhere classified (R26.2)     Time: 3559-7416 PT Time Calculation (min) (ACUTE ONLY): 25 min  Charges:  $Gait Training: 8-22 mins $Therapeutic Exercise: 8-22 mins $Therapeutic Activity: 8-22 mins                     Debe Coder PT Acute Rehabilitation Services Pager [phone] Office [phone]    Marolyn Urschel 12/18/2021, 3:46 PM

## 2021-12-18 NOTE — Plan of Care (Signed)

## 2021-12-18 NOTE — Discharge Instructions (Signed)

## 2021-12-18 NOTE — Progress Notes (Signed)
Physical Therapy Treatment Patient Details Name: [name] [name] MRN: [id number] DOB: October 26, 1951 Today's Date: 12/18/2021   History of Present Illness Pt s/p R TKR and with hx of chronic LBP, CAD and MI    PT Comments    Pt continues very cooperative but limited by increased pain this am.  Pt up to bathroom for toileting and hygiene at sink, ambulated limited distance in hall and performed therex program with assist.     Recommendations for follow up therapy are one component of a multi-disciplinary discharge planning process, led by the attending physician.  Recommendations may be updated based on patient status, additional functional criteria and insurance authorization.  Follow Up Recommendations  Follow physician's recommendations for discharge plan and follow up therapies     Assistance Recommended at Discharge Intermittent Supervision/Assistance  Patient can return home with the following A little help with walking and/or transfers;A little help with bathing/dressing/bathroom;Assistance with cooking/housework;Help with stairs or ramp for entrance   Equipment Recommendations  None recommended by PT    Recommendations for Other Services       Precautions / Restrictions Precautions Precautions: Fall;Knee Required Braces or Orthoses: Knee Immobilizer - Right Knee Immobilizer - Right: Discontinue once straight leg raise with < 10 degree lag Restrictions Weight Bearing Restrictions: No Other Position/Activity Restrictions: WBAT     Mobility  Bed Mobility Overal bed mobility: Needs Assistance Bed Mobility: Supine to Sit     Supine to sit: Min assist     General bed mobility comments: Increased time with cues for sequence and use of L LE to self assist    Transfers Overall transfer level: Needs assistance Equipment used: Rolling walker (2 wheels) Transfers: Sit to/from Stand Sit to Stand: Min assist;Min guard           General transfer comment: cues for LE  management and use of UEs to self assist    Ambulation/Gait Ambulation/Gait assistance: Min assist;Min guard Gait Distance (Feet): 44 Feet (and additional 15' into bathroom) Assistive device: Rolling walker (2 wheels) Gait Pattern/deviations: Step-to pattern;Decreased step length - right;Decreased step length - left;Shuffle;Trunk flexed Gait velocity: decr     General Gait Details: cues for sequenc, posture and position from [employer]             Wheelchair Mobility    Modified Rankin (Stroke Patients Only)       Balance Overall balance assessment: Needs assistance Sitting-balance support: No upper extremity supported;Feet supported Sitting balance-Leahy Scale: Good     Standing balance support: Single extremity supported Standing balance-Leahy Scale: Poor                              Cognition Arousal/Alertness: Awake/alert Behavior During Therapy: WFL for tasks assessed/performed Overall Cognitive Status: Within Functional Limits for tasks assessed                                          Exercises Total Joint Exercises Ankle Circles/Pumps: AROM;Both;15 reps;Supine Quad Sets: AROM;Both;10 reps;Supine Heel Slides: AAROM;10 reps;Supine;Right Straight Leg Raises: AAROM;10 reps;Supine;Right Goniometric ROM: -5 - 35 AAROM R knee    General Comments        Pertinent Vitals/Pain Pain Assessment: 0-10 Pain Score: 7  Pain Location: R knee Pain Descriptors / Indicators: Aching;Sore Pain Intervention(s): Limited activity within patient's tolerance  Home Living                          Prior Function            PT Goals (current goals can now be found in the care plan section) Acute Rehab PT Goals Patient Stated Goal: REgain IND PT Goal Formulation: With patient Time For Goal Achievement: 12/24/21 Potential to Achieve Goals: Good Progress towards PT goals: Progressing toward goals    Frequency     7X/week      PT Plan Current plan remains appropriate    Co-evaluation              AM-PAC PT "6 Clicks" Mobility   Outcome Measure  Help needed turning from your back to your side while in a flat bed without using bedrails?: A Little Help needed moving from lying on your back to sitting on the side of a flat bed without using bedrails?: A Little Help needed moving to and from a bed to a chair (including a wheelchair)?: A Little Help needed standing up from a chair using your arms (e.g., wheelchair or bedside chair)?: A Little Help needed to walk in hospital room?: A Little Help needed climbing 3-5 steps with a railing? : A Lot 6 Click Score: 17    End of Session Equipment Utilized During Treatment: Gait belt;Right knee immobilizer Activity Tolerance: Patient tolerated treatment well;Patient limited by pain Patient left: in chair;with call bell/phone within reach;with chair alarm set;with family/visitor present Nurse Communication: Mobility status PT Visit Diagnosis: Difficulty in walking, not elsewhere classified (R26.2)     Time: 6948-5462 PT Time Calculation (min) (ACUTE ONLY): 45 min  Charges:  $Gait Training: 8-22 mins $Therapeutic Exercise: 8-22 mins $Therapeutic Activity: 8-22 mins                     Debe Coder PT Acute Rehabilitation Services Pager [phone] Office [phone]    Ashlynn Gunnels 12/18/2021, 12:37 PM

## 2021-12-18 NOTE — Progress Notes (Signed)
Subjective: 1 Day Post-Op Procedure(s) (LRB): RIGHT TOTAL KNEE ARTHROPLASTY (Right) Patient reports pain as moderate.    Objective: Vital signs in last 24 hours: Temp:  [97.5 F (36.4 C)-98.3 F (36.8 C)] 98.3 F (36.8 C) (01/14 0635) Pulse Rate:  [59-67] 63 (01/14 0635) Resp:  [9-20] 16 (01/14 0635) BP: (97-125)/(50-82) 97/50 (01/14 0635) SpO2:  [94 %-100 %] 96 % (01/14 0635)  Intake/Output from previous day: 01/13 0701 - 01/14 0700 In: 3544.1 [P.O.:840; I.V.:2454.1; IV Piggyback:250] Out: 1450 [Urine:1400; Blood:50] Intake/Output this shift: No intake/output data recorded.  Recent Labs    12/18/21 0336  HGB 11.9*   Recent Labs    12/18/21 0336  WBC 8.3  RBC 3.91  HCT 37.1  PLT 210   Recent Labs    12/18/21 0336  NA 138  K 3.9  CL 106  CO2 27  BUN 16  CREATININE 0.83  GLUCOSE 143*  CALCIUM 8.8*   No results for input(s): LABPT, INR in the last 72 hours.  Sensation intact distally Intact pulses distally Dorsiflexion/Plantar flexion intact Incision: scant drainage Compartment soft   Assessment/Plan: 1 Day Post-Op Procedure(s) (LRB): RIGHT TOTAL KNEE ARTHROPLASTY (Right) Up with therapy Plan for discharge tomorrow Discharge home with home health      Mcarthur Rossetti 12/18/2021, 9:28 AM

## 2021-12-18 NOTE — Op Note (Signed)
NAME: [name] [name], [name] [name] MEDICAL RECORD NO: [id number] ACCOUNT NO: [id number] DATE OF BIRTH: 20-Dec-1950 FACILITY: [facility]: [facility] PHYSICIAN: Lind Guest. Ninfa Linden, MD  Operative Report   DATE OF PROCEDURE: 12/17/2021  PREOPERATIVE DIAGNOSES:  Primary osteoarthritis and degenerative joint disease, right knee.  POSTOPERATIVE DIAGNOSES:  Primary osteoarthritis and degenerative joint disease, right knee.  PROCEDURE:  Right total knee arthroplasty.  IMPLANTS:  Biomet/Zimmer Persona knee system with size 6 right CR femur, size E right tibial tray, 12 mm medial congruent fixed bearing polyethylene insert, size 29 mm patellar button.  SURGEON:  Jean Rosenthal, MD  ASSISTANT:  Rosanna Randy, PA-C  ANESTHESIA: 1.  Right lower extremity adductor canal block. 2.  Spinal.  ANTIBIOTICS:  2 g IV Ancef.  TOURNIQUET TIME:  Under one hour.  ESTIMATED BLOOD LOSS:  Less than 100 mL.  COMPLICATIONS:  None.  INDICATIONS:  The patient is a 71 year old female well known to me.  I have seen her for many years now as it relates to her arthritic right knee.  She has tried and failed conservative treatment for many years including activity modification and  anti-inflammatories as well as steroid injections and hyaluronic acid injections.  It has gone to where her right knee pain is daily and it is detrimentally affecting her mobility, her quality of life and activities of daily living to the point she does  wish to proceed with the knee replacement and we agree with this as well.  We did talk at length and detail about the risk of acute blood loss anemia, nerve or vessel injury, fracture, infection, DVT, implant failure and skin and soft tissue issues.  We  talked about our goals being decreased pain, improve mobility and her overall improve quality of life.  DESCRIPTION OF PROCEDURE:  After informed consent was obtained, appropriate right leg, and knee were marked and adductor canal block was  obtained in the right lower extremity in the holding room.  She was then brought to the operating room and sat up on  the operating table.  Spinal anesthesia was obtained.  She was then laid in supine position.  A Foley catheter was placed and a nonsterile tourniquet was placed around her upper right thigh.  Her right thigh, knee, leg, ankle, foot were prepped and  draped with DuraPrep and sterile drapes.  A timeout was called and she was identified as correct patient, correct right knee.  We then used Esmarch to wrap that leg and tourniquet inflated to 300 mm of pressure.  I then made a direct midline incision  over the patella and carried this proximally and distally.  We dissected down the knee joint, carried out a medial parapatellar arthrotomy, finding a large joint effusion and significant osteophytes and wear of the cartilage throughout all 3 compartments  and specifically the medial compartment. With the knee in a flexed position, we removed remnants of the ACL, medial and lateral meniscus.  We then used our extramedullary cutting guide for making our proximal tibia cut correcting for varus and valgus  and  slope of -5 degrees.  We made this for taking 9 mm off the high side.  We made this cut without difficulty.  We went to the femur and used intramedullary guide for making our distal femoral cut, setting this for a right knee at 5 degrees externally  rotated and 10 mm distal femoral cut, we made that cut without difficulty, and brought the knee back down to full extension. With a 10 mm extension  block, just had a slight play and then did look good.  We went back to the femur and put our femoral  sizing guide based off the epicondylar axis and Whitesides line.  Based off of this, we chose a size 6 CR femur.  We put a 4-in-1 cutting block for a size 6 femur, made our anterior and posterior cuts, followed by our chamfer cuts.  We went back to the  tibia and chose a size E tibial tray for coverage of  the tibial plateau setting the rotation of the tibia and the femur.  We made our keel punch and drill hole off of this.  With the trial E tibia, we trialed a right 6 femur and a 10 mm medial congruent  polyethylene insert.  We went up to a 12 mm insert and that gave her more stability and still had good range of motion.  We then made our patellar cut and drilled 3 holes for size 29 patellar button.  With all trial instrumentation in the knee, I put her  through several cycles of motion.  I was pleased with range of motion and stability.  We then removed all instrumentation from the knee and irrigated the knee with normal saline solution using pulsatile lavage.  We dried the knee real well and then with  the knee in a flexed position, we mixed our cement and then cemented our real  Biomet Zimmer Persona tibia tray, size E right followed by our size 6 CR right femur.  We placed our 12 mm medial congruent polyethylene insert and cemented our size 29  patellar button.  We then held the knee completely extended and compressed in order for the cement to harden.  Once it hardened, we let the tourniquet down.  Hemostasis was obtained with electrocautery.  We then closed the arthrotomy with interrupted #1  Vicryl suture followed by 0 Vicryl to close the deep tissue and 2-0 Vicryl to close subcutaneous tissue.  The skin was closed with staples.  Xeroform well-padded sterile dressing was applied.  She was taken to recovery room in stable condition with all  final counts being correct.  No complications noted.  Of note, [name] Stabile, PA-C did assist in entire case and his assistance was crucial for facilitating every aspect of this case.   NIK D: 12/17/2021 12:02:45 pm T: 12/18/2021 12:11:00 am  JOB: [id number]/ [id number]

## 2021-12-19 DIAGNOSIS — M1711 Unilateral primary osteoarthritis, right knee: Secondary | ICD-10-CM | POA: Diagnosis not present

## 2021-12-19 NOTE — Progress Notes (Signed)
Subjective: 2 Days Post-Op Procedure(s) (LRB): RIGHT TOTAL KNEE ARTHROPLASTY (Right) Patient reports pain as moderate.    Objective: Vital signs in last 24 hours: Temp:  [97.6 F (36.4 C)-99.5 F (37.5 C)] 99.2 F (37.3 C) (01/15 0535) Pulse Rate:  [64-72] 69 (01/15 0535) Resp:  [16-20] 16 (01/15 0535) BP: (92-120)/(53-63) 118/53 (01/15 0535) SpO2:  [91 %-100 %] 91 % (01/15 0535)  Intake/Output from previous day: 01/14 0701 - 01/15 0700 In: 360 [P.O.:360] Out: -  Intake/Output this shift: No intake/output data recorded.  Recent Labs    12/18/21 0336  HGB 11.9*   Recent Labs    12/18/21 0336  WBC 8.3  RBC 3.91  HCT 37.1  PLT 210   Recent Labs    12/18/21 0336  NA 138  K 3.9  CL 106  CO2 27  BUN 16  CREATININE 0.83  GLUCOSE 143*  CALCIUM 8.8*   No results for input(s): LABPT, INR in the last 72 hours.  Neurologically intact Neurovascular intact Sensation intact distally Intact pulses distally Dorsiflexion/Plantar flexion intact Incision: dressing C/D/I No cellulitis present Compartment soft   Assessment/Plan: 2 Days Post-Op Procedure(s) (LRB): RIGHT TOTAL KNEE ARTHROPLASTY (Right) Advance diet Up with therapy D/C IV fluids Discharge home with home health after second PT session as long as she mobilizes well and pain is under control WBAT RLE Please apply thigh high ted hose   Anticipated LOS equal to or greater than 2 midnights due to - Age 69 and older with one or more of the following:  - Obesity  - Expected need for hospital services (PT, OT, Nursing) required for safe  discharge  - Anticipated need for postoperative skilled nursing care or inpatient rehab  - Active co-morbidities: Heart Attack OR   - Unanticipated findings during/Post Surgery: Slow post-op progression: GI, pain control, mobility  - Patient is a high risk of re-admission due to: None   Aundra Dubin 12/19/2021, 7:55 AM
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
## 2021-12-19 NOTE — Progress Notes (Signed)
Physical Therapy Treatment Patient Details Name: [name] [name] MRN: [id number] DOB: [birth year] Today's Date: 12/19/2021   History of Present Illness Pt s/p R TKR and with hx of chronic LBP, CAD and MI    PT Comments    Pt continues cooperative and with improvement noted in pain control, activity tolerance, and stability.  Pt up to bathroom for toileting and hygiene standing at sink and ambulated increased distance in hall.  Pt hopeful for dc home tomorrow.  Recommendations for follow up therapy are one component of a multi-disciplinary discharge planning process, led by the attending physician.  Recommendations may be updated based on patient status, additional functional criteria and insurance authorization.  Follow Up Recommendations  Follow physician's recommendations for discharge plan and follow up therapies     Assistance Recommended at Discharge Intermittent Supervision/Assistance  Patient can return home with the following A little help with walking and/or transfers;A little help with bathing/dressing/bathroom;Assistance with cooking/housework;Help with stairs or ramp for entrance   Equipment Recommendations  None recommended by PT    Recommendations for Other Services       Precautions / Restrictions Precautions Precautions: Fall;Knee Required Braces or Orthoses: Knee Immobilizer - Right Knee Immobilizer - Right: Discontinue once straight leg raise with < 10 degree lag Restrictions Weight Bearing Restrictions: No Other Position/Activity Restrictions: WBAT     Mobility  Bed Mobility Overal bed mobility: Needs Assistance Bed Mobility: Supine to Sit;Sit to Supine     Supine to sit: Min assist Sit to supine: Min assist   General bed mobility comments: Increased time with cues for sequence and use of L LE to self assist    Transfers Overall transfer level: Needs assistance Equipment used: Rolling walker (2 wheels) Transfers: Sit to/from Stand Sit to Stand: Min  guard;Supervision           General transfer comment: cues for LE management and use of UEs to self assist    Ambulation/Gait Ambulation/Gait assistance: Min guard Gait Distance (Feet): 75 Feet (and 15' into bathroom) Assistive device: Rolling walker (2 wheels) Gait Pattern/deviations: Step-to pattern;Decreased step length - right;Decreased step length - left;Shuffle;Trunk flexed Gait velocity: decr     General Gait Details: cues for sequence, posture and position from RW.  Initial difficulty with R LE WB but improved with increased distance walked   Stairs             Wheelchair Mobility    Modified Rankin (Stroke Patients Only)       Balance Overall balance assessment: Needs assistance Sitting-balance support: No upper extremity supported;Feet supported Sitting balance-Leahy Scale: Good     Standing balance support: No upper extremity supported Standing balance-Leahy Scale: Fair                              Cognition Arousal/Alertness: Awake/alert Behavior During Therapy: WFL for tasks assessed/performed Overall Cognitive Status: Within Functional Limits for tasks assessed                                          Exercises      General Comments        Pertinent Vitals/Pain Pain Assessment: 0-10 Pain Score: 6  Pain Location: R knee Pain Descriptors / Indicators: Aching;Sore Pain Intervention(s): Limited activity within patient's tolerance;Monitored during session;Premedicated before session;Ice applied    Home Living  Prior Function            PT Goals (current goals can now be found in the care plan section) Acute Rehab PT Goals Patient Stated Goal: REgain IND PT Goal Formulation: With patient Time For Goal Achievement: 12/24/21 Potential to Achieve Goals: Good Progress towards PT goals: Progressing toward goals    Frequency    7X/week      PT Plan Current plan  remains appropriate    Co-evaluation              AM-PAC PT "6 Clicks" Mobility   Outcome Measure  Help needed turning from your back to your side while in a flat bed without using bedrails?: A Little Help needed moving from lying on your back to sitting on the side of a flat bed without using bedrails?: A Little Help needed moving to and from a bed to a chair (including a wheelchair)?: A Little Help needed standing up from a chair using your arms (e.g., wheelchair or bedside chair)?: A Little Help needed to walk in hospital room?: A Little Help needed climbing 3-5 steps with a railing? : A Lot 6 Click Score: 17    End of Session Equipment Utilized During Treatment: Gait belt;Right knee immobilizer Activity Tolerance: Patient tolerated treatment well;Patient limited by pain Patient left: in bed;with call bell/phone within reach;with bed alarm set Nurse Communication: Mobility status PT Visit Diagnosis: Difficulty in walking, not elsewhere classified (R26.2)     Time: 1448-1856 PT Time Calculation (min) (ACUTE ONLY): 25 min  Charges:  $Gait Training: 8-22 mins $Therapeutic Activity: 8-22 mins                     [place] Pager [phone] Office [phone]    Tracer Gutridge 12/19/2021, 4:53 PM

## 2021-12-19 NOTE — TOC Transition Note (Signed)
Transition of Care [facility]) - CM/SW Discharge Note   Patient Details  Name: [name] [name] MRN: [id number] Date of Birth: 1950-12-14  [facility] CM/SW Contact:  Ross Ludwig, LCSW Phone Number: 12/19/2021, 9:37 AM   Clinical Narrative:    Patient will be going home with home health through [place].  CSW signing off please reconsult with any other social work needs, home health agency has been notified of planned discharge.    Final next level of care: [place] Barriers to Discharge: Barriers Resolved   Patient Goals and CMS Choice Patient states their goals for this hospitalization and ongoing recovery are:: To return back home with husband. CMS Medicare.gov Compare Post Acute Care list provided to:: Patient Choice offered to / list presented to : Patient  Discharge Placement                       Discharge Plan and Services                DME Arranged:  (Patient has all DME (RW, BSC/3in1))         HH Arranged: PT HH Agency: [place] Determinants of Health (SDOH) Interventions     Readmission Risk Interventions No flowsheet data found.

## 2021-12-19 NOTE — Progress Notes (Signed)
Physical Therapy Treatment Patient Details Name: [name] [name] MRN: [id number] DOB: November 27, 1951 Today's Date: 12/19/2021   History of Present Illness Pt s/p R TKR and with hx of chronic LBP, CAD and MI    PT Comments    Pt continues cooperative but progressing slowly 2* pain control issues.    Recommendations for follow up therapy are one component of a multi-disciplinary discharge planning process, led by the attending physician.  Recommendations may be updated based on patient status, additional functional criteria and insurance authorization.  Follow Up Recommendations  Follow physician's recommendations for discharge plan and follow up therapies     Assistance Recommended at Discharge Intermittent Supervision/Assistance  Patient can return home with the following A little help with walking and/or transfers;A little help with bathing/dressing/bathroom;Assistance with cooking/housework;Help with stairs or ramp for entrance   Equipment Recommendations  None recommended by PT    Recommendations for Other Services       Precautions / Restrictions Precautions Precautions: Fall;Knee Required Braces or Orthoses: Knee Immobilizer - Right Knee Immobilizer - Right: Discontinue once straight leg raise with < 10 degree lag Restrictions Weight Bearing Restrictions: No Other Position/Activity Restrictions: WBAT     Mobility  Bed Mobility Overal bed mobility: Needs Assistance Bed Mobility: Sit to Supine       Sit to supine: Min assist   General bed mobility comments: Increased time with cues for sequence and use of L LE to self assist    Transfers Overall transfer level: Needs assistance Equipment used: Rolling walker (2 wheels) Transfers: Sit to/from Stand Sit to Stand: Min assist           General transfer comment: cues for LE management and use of UEs to self assist    Ambulation/Gait Ambulation/Gait assistance: Min assist;Min guard Gait Distance (Feet): 58  Feet Assistive device: Rolling walker (2 wheels) Gait Pattern/deviations: Step-to pattern;Decreased step length - right;Decreased step length - left;Shuffle;Trunk flexed Gait velocity: decr     General Gait Details: cues for sequence, posture and position from RW.  Initial difficulty with R LE WB but improved with increased distance walked   Chief Strategy Officer    Modified Rankin (Stroke Patients Only)       Balance Overall balance assessment: Needs assistance Sitting-balance support: No upper extremity supported;Feet supported Sitting balance-Leahy Scale: Good     Standing balance support: Single extremity supported Standing balance-Leahy Scale: Poor                              Cognition Arousal/Alertness: Awake/alert Behavior During Therapy: WFL for tasks assessed/performed Overall Cognitive Status: Within Functional Limits for tasks assessed                                          Exercises Total Joint Exercises Ankle Circles/Pumps: AROM;Both;15 reps;Supine Quad Sets: AROM;Both;10 reps;Supine Heel Slides: AAROM;10 reps;Supine;Right Straight Leg Raises: AAROM;10 reps;Supine;Right Goniometric ROM: -5 - 20 AAROM R knee - pain ltd    General Comments        Pertinent Vitals/Pain Pain Assessment: 0-10 Pain Score: 10-Worst pain ever Pain Location: R knee Pain Descriptors / Indicators: Aching;Sore Pain Intervention(s): Limited activity within patient's tolerance;Monitored during session;Premedicated before session;Ice applied    Home Living  Prior Function            PT Goals (current goals can now be found in the care plan section) Acute Rehab PT Goals Patient Stated Goal: REgain IND PT Goal Formulation: With patient Time For Goal Achievement: 12/24/21 Potential to Achieve Goals: Good Progress towards PT goals: Progressing toward goals    Frequency     7X/week      PT Plan Current plan remains appropriate    Co-evaluation              AM-PAC PT "6 Clicks" Mobility   Outcome Measure  Help needed turning from your back to your side while in a flat bed without using bedrails?: A Little Help needed moving from lying on your back to sitting on the side of a flat bed without using bedrails?: A Little Help needed moving to and from a bed to a chair (including a wheelchair)?: A Little Help needed standing up from a chair using your arms (e.g., wheelchair or bedside chair)?: A Little Help needed to walk in hospital room?: A Little Help needed climbing 3-5 steps with a railing? : A Lot 6 Click Score: 17    End of Session Equipment Utilized During Treatment: Gait belt;Right knee immobilizer Activity Tolerance: Patient tolerated treatment well;Patient limited by pain Patient left: in bed;with call bell/phone within reach;with family/visitor present Nurse Communication: Mobility status PT Visit Diagnosis: Difficulty in walking, not elsewhere classified (R26.2)     Time: 1021-1050 PT Time Calculation (min) (ACUTE ONLY): 29 min  Charges:  $Gait Training: 8-22 mins $Therapeutic Exercise: 8-22 mins                     Debe Coder PT Acute Rehabilitation Services Pager [phone] Office [phone]    [name] [name] 12/19/2021, 12:36 PM

## 2021-12-19 NOTE — Plan of Care (Signed)

## 2021-12-20 DIAGNOSIS — M1711 Unilateral primary osteoarthritis, right knee: Secondary | ICD-10-CM | POA: Diagnosis not present

## 2021-12-20 NOTE — Progress Notes (Signed)
Physical Therapy Treatment Patient Details Name: [name] [name] MRN: [id number] DOB: November 12, 1951 Today's Date: 12/20/2021   History of Present Illness Pt s/p R TKR and with hx of chronic LBP, CAD and MI    PT Comments    POD # 3 pm session Assisted with amb a greater distance in hallway then to bathroom.  Assisted back to bed for some TE's. Pt is ready for D/C to home.   Recommendations for follow up therapy are one component of a multi-disciplinary discharge planning process, led by the attending physician.  Recommendations may be updated based on patient status, additional functional criteria and insurance authorization.  Follow Up Recommendations  Follow physician's recommendations for discharge plan and follow up therapies     Assistance Recommended at Discharge Intermittent Supervision/Assistance  Patient can return home with the following A little help with walking and/or transfers;A little help with bathing/dressing/bathroom;Assistance with cooking/housework;Help with stairs or ramp for entrance   Equipment Recommendations  None recommended by PT    Recommendations for Other Services       Precautions / Restrictions Precautions Precautions: Fall;Knee Precaution Comments: instructed no pillow under knee Restrictions Weight Bearing Restrictions: No Other Position/Activity Restrictions: WBAT     Mobility  Bed Mobility Overal bed mobility: Needs Assistance Bed Mobility: Sit to Supine     Supine to sit: Supervision;Min guard     General bed mobility comments: demonstarted and instructed on how to use a belt to self assist LE back onto bed    Transfers Overall transfer level: Needs assistance Equipment used: Rolling walker (2 wheels) Transfers: Sit to/from Stand Sit to Stand: Supervision;Min guard           General transfer comment: cues for LE management and use of UEs to self assist also asissted with toilet transfer    Ambulation/Gait Ambulation/Gait  assistance: Supervision;Min guard Gait Distance (Feet): 55 Feet Assistive device: Rolling walker (2 wheels) Gait Pattern/deviations: Step-to pattern;Decreased step length - right;Decreased step length - left;Shuffle;Trunk flexed Gait velocity: decr     General Gait Details: cues for sequence, posture and position from RW.  Initial difficulty with R LE WB but improved with increased distance walked   Stairs Stairs: Yes Stairs assistance: Min guard;Min assist Stair Management: One rail Left;Step to pattern;Forwards;With cane Number of Stairs: 2 General stair comments: with spouse present assisted pt with up/down 2 steps forward using one rail and one cane.   Wheelchair Mobility    Modified Rankin (Stroke Patients Only)       Balance                                            Cognition Arousal/Alertness: Awake/alert Behavior During Therapy: WFL for tasks assessed/performed Overall Cognitive Status: Within Functional Limits for tasks assessed                                 General Comments: AxO x 3 very sweet Lady but very tired with reports of poor sleep        Exercises  Total Knee Replacement TE's following HEP handout 10 reps B LE ankle pumps 05 reps towel squeezes 05 reps knee presses 05 reps heel slides  05 reps SAQ's 05 reps SLR's 05 reps ABD Educated on use of gait belt to assist with TE's Followed by  ICE     General Comments        Pertinent Vitals/Pain Pain Assessment: 0-10 Pain Score: 6  Pain Location: R knee Pain Descriptors / Indicators: Aching;Sore;Tender;Operative site guarding Pain Intervention(s): Monitored during session;Repositioned;Ice applied    Home Living                          Prior Function            PT Goals (current goals can now be found in the care plan section) Progress towards PT goals: Progressing toward goals    Frequency    7X/week      PT Plan Current plan  remains appropriate    Co-evaluation              AM-PAC PT "6 Clicks" Mobility   Outcome Measure  Help needed turning from your back to your side while in a flat bed without using bedrails?: A Little Help needed moving from lying on your back to sitting on the side of a flat bed without using bedrails?: A Little Help needed moving to and from a bed to a chair (including a wheelchair)?: A Little Help needed standing up from a chair using your arms (e.g., wheelchair or bedside chair)?: A Little Help needed to walk in hospital room?: A Little Help needed climbing 3-5 steps with a railing? : A Little 6 Click Score: 18    End of Session Equipment Utilized During Treatment: Gait belt Activity Tolerance: Patient tolerated treatment well;Patient limited by pain Patient left: in chair;with call bell/phone within reach;with family/visitor present Nurse Communication: Mobility status PT Visit Diagnosis: Difficulty in walking, not elsewhere classified (R26.2)     Time: 1353-1420 PT Time Calculation (min) (ACUTE ONLY): 27 min  Charges:  $Gait Training: 8-22 mins $Therapeutic Exercise: 8-22 mins                      {Rorey Bisson  PTA Acute  Rehabilitation Services Pager      [phone] Office      [phone]

## 2021-12-20 NOTE — Progress Notes (Signed)
Physical Therapy Treatment Patient Details Name: [name] [name] MRN: [id number] DOB: 06-Nov-1951 Today's Date: 12/20/2021   History of Present Illness Pt s/p R TKR and with hx of chronic LBP, CAD and MI    PT Comments    POD # 3 am session General Comments: AxO x 3 very sweet Lady but very tired with reports of poor sleep Assisted OOB required increased time.  General bed mobility comments: demonstarted and instructed on how to use a belt to self assist LE off bed.  General transfer comment: cues for LE management and use of UEs to self assist also asissted with toilet transfer.  General Gait Details: cues for sequence, posture and position from RW.  Initial difficulty with R LE WB but improved with increased distance walked.  General stair comments: with spouse present assisted pt with up/down 2 steps forward using one rail and one cane. Then returned to room to perform some TE's following HEP handout.  Instructed on proper tech, freq as well as use of ICE.   Pt will need a second PT session to complete TE's and Family Education.   Recommendations for follow up therapy are one component of a multi-disciplinary discharge planning process, led by the attending physician.  Recommendations may be updated based on patient status, additional functional criteria and insurance authorization.  Follow Up Recommendations  Follow physician's recommendations for discharge plan and follow up therapies     Assistance Recommended at Discharge Intermittent Supervision/Assistance  Patient can return home with the following A little help with walking and/or transfers;A little help with bathing/dressing/bathroom;Assistance with cooking/housework;Help with stairs or ramp for entrance   Equipment Recommendations  None recommended by PT    Recommendations for Other Services       Precautions / Restrictions Precautions Precautions: Fall;Knee Precaution Comments: instructed no pillow under  knee Restrictions Weight Bearing Restrictions: No Other Position/Activity Restrictions: WBAT     Mobility  Bed Mobility Overal bed mobility: Needs Assistance Bed Mobility: Supine to Sit     Supine to sit: Supervision;Min guard     General bed mobility comments: demonstarted and instructed on how to use a belt to self assist LE off bed    Transfers Overall transfer level: Needs assistance Equipment used: Rolling walker (2 wheels) Transfers: Sit to/from Stand Sit to Stand: Supervision;Min guard           General transfer comment: cues for LE management and use of UEs to self assist also asissted with toilet transfer    Ambulation/Gait Ambulation/Gait assistance: Supervision;Min guard Gait Distance (Feet): 45 Feet Assistive device: Rolling walker (2 wheels) Gait Pattern/deviations: Step-to pattern;Decreased step length - right;Decreased step length - left;Shuffle;Trunk flexed Gait velocity: decr     General Gait Details: cues for sequence, posture and position from RW.  Initial difficulty with R LE WB but improved with increased distance walked   Stairs Stairs: Yes Stairs assistance: Min guard;Min assist Stair Management: One rail Left;Step to pattern;Forwards;With cane Number of Stairs: 2 General stair comments: with spouse present assisted pt with up/down 2 steps forward using one rail and one cane.   Wheelchair Mobility    Modified Rankin (Stroke Patients Only)       Balance                                            Cognition Arousal/Alertness: Awake/alert Behavior During Therapy:  WFL for tasks assessed/performed Overall Cognitive Status: Within Functional Limits for tasks assessed                                 General Comments: AxO x 3 very sweet Lady but very tired with reports of poor sleep        Exercises  Total Knee Replacement TE's following HEP handout 10 reps B LE ankle pumps 05 reps towel  squeezes 05 reps knee presses Unable to complete due to increased pain level      General Comments        Pertinent Vitals/Pain Pain Assessment: 0-10 Pain Score: 6  Pain Location: R knee Pain Descriptors / Indicators: Aching;Sore;Tender;Operative site guarding Pain Intervention(s): Monitored during session;Repositioned;Ice applied    Home Living                          Prior Function            PT Goals (current goals can now be found in the care plan section) Progress towards PT goals: Progressing toward goals    Frequency    7X/week      PT Plan Current plan remains appropriate    Co-evaluation              AM-PAC PT "6 Clicks" Mobility   Outcome Measure  Help needed turning from your back to your side while in a flat bed without using bedrails?: A Little Help needed moving from lying on your back to sitting on the side of a flat bed without using bedrails?: A Little Help needed moving to and from a bed to a chair (including a wheelchair)?: A Little Help needed standing up from a chair using your arms (e.g., wheelchair or bedside chair)?: A Little Help needed to walk in hospital room?: A Little Help needed climbing 3-5 steps with a railing? : A Little 6 Click Score: 18    End of Session Equipment Utilized During Treatment: Gait belt Activity Tolerance: Patient tolerated treatment well;Patient limited by pain Patient left: in chair;with call bell/phone within reach;with family/visitor present Nurse Communication: Mobility status PT Visit Diagnosis: Difficulty in walking, not elsewhere classified (R26.2)     Time: 1610-9604 PT Time Calculation (min) (ACUTE ONLY): 44 min  Charges:  $Gait Training: 8-22 mins $Therapeutic Exercise: 8-22 mins $Therapeutic Activity: 8-22 mins                     Rica Koyanagi  PTA Acute  Rehabilitation Services Pager      [phone] Office      [phone]

## 2021-12-20 NOTE — Plan of Care (Signed)
Patient discharged home with husband via personal vehicle. Ivan Anchors, RN 12/20/21 3:50 PM

## 2021-12-20 NOTE — Progress Notes (Signed)
Patient ID: [name] [name], female   DOB: 04/27/1951, 71 y.o.   MRN: [id number] There has been no acute changes over the last 24 hours.  The patient's right knee is stable and her vitals are stable.  Pain is been a limiting factor for discharge.  I gave her encouragement that being in a home environment can certainly help her feel better.  It does not sound like she needs skilled nursing.  We will try to discharge her to home again for this afternoon.

## 2021-12-20 NOTE — Plan of Care (Signed)
Problem: Education: Goal: Knowledge of the prescribed therapeutic regimen will improve Outcome: Progressing   Problem: Activity: Goal: Ability to avoid complications of mobility impairment will improve Outcome: Progressing   Problem: Pain Management: Goal: Pain level will decrease with appropriate interventions Outcome: Progressing   Ivan Anchors, RN 12/20/21 9:15 AM
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
## 2021-12-21 ENCOUNTER — Encounter (HOSPITAL_COMMUNITY): Payer: Self-pay | Admitting: Orthopaedic Surgery

## 2021-12-21 ENCOUNTER — Telehealth: Payer: Self-pay | Admitting: *Deleted

## 2021-12-21 DIAGNOSIS — I252 Old myocardial infarction: Secondary | ICD-10-CM | POA: Diagnosis not present

## 2021-12-21 DIAGNOSIS — M19041 Primary osteoarthritis, right hand: Secondary | ICD-10-CM | POA: Diagnosis not present

## 2021-12-21 DIAGNOSIS — M19042 Primary osteoarthritis, left hand: Secondary | ICD-10-CM | POA: Diagnosis not present

## 2021-12-21 DIAGNOSIS — Z85828 Personal history of other malignant neoplasm of skin: Secondary | ICD-10-CM | POA: Diagnosis not present

## 2021-12-21 DIAGNOSIS — I2510 Atherosclerotic heart disease of native coronary artery without angina pectoris: Secondary | ICD-10-CM | POA: Diagnosis not present

## 2021-12-21 DIAGNOSIS — Z6832 Body mass index (BMI) 32.0-32.9, adult: Secondary | ICD-10-CM | POA: Diagnosis not present

## 2021-12-21 DIAGNOSIS — Z7982 Long term (current) use of aspirin: Secondary | ICD-10-CM | POA: Diagnosis not present

## 2021-12-21 DIAGNOSIS — Z955 Presence of coronary angioplasty implant and graft: Secondary | ICD-10-CM | POA: Diagnosis not present

## 2021-12-21 DIAGNOSIS — E669 Obesity, unspecified: Secondary | ICD-10-CM | POA: Diagnosis not present

## 2021-12-21 DIAGNOSIS — Z471 Aftercare following joint replacement surgery: Secondary | ICD-10-CM | POA: Diagnosis not present

## 2021-12-21 DIAGNOSIS — R32 Unspecified urinary incontinence: Secondary | ICD-10-CM | POA: Diagnosis not present

## 2021-12-21 DIAGNOSIS — M545 Low back pain, unspecified: Secondary | ICD-10-CM | POA: Diagnosis not present

## 2021-12-21 DIAGNOSIS — G8929 Other chronic pain: Secondary | ICD-10-CM | POA: Diagnosis not present

## 2021-12-21 DIAGNOSIS — Z90710 Acquired absence of both cervix and uterus: Secondary | ICD-10-CM | POA: Diagnosis not present

## 2021-12-21 DIAGNOSIS — K219 Gastro-esophageal reflux disease without esophagitis: Secondary | ICD-10-CM | POA: Diagnosis not present

## 2021-12-21 DIAGNOSIS — E782 Mixed hyperlipidemia: Secondary | ICD-10-CM | POA: Diagnosis not present

## 2021-12-21 DIAGNOSIS — M47812 Spondylosis without myelopathy or radiculopathy, cervical region: Secondary | ICD-10-CM | POA: Diagnosis not present

## 2021-12-21 DIAGNOSIS — Z87442 Personal history of urinary calculi: Secondary | ICD-10-CM | POA: Diagnosis not present

## 2021-12-21 DIAGNOSIS — Z96651 Presence of right artificial knee joint: Secondary | ICD-10-CM | POA: Diagnosis not present

## 2021-12-21 DIAGNOSIS — I119 Hypertensive heart disease without heart failure: Secondary | ICD-10-CM | POA: Diagnosis not present

## 2021-12-21 NOTE — Telephone Encounter (Signed)
Ortho bundle D/C call completed. 

## 2021-12-23 ENCOUNTER — Ambulatory Visit: Payer: PPO | Admitting: Rehabilitative and Restorative Service Providers"

## 2021-12-24 ENCOUNTER — Other Ambulatory Visit: Payer: Self-pay | Admitting: Orthopaedic Surgery

## 2021-12-24 ENCOUNTER — Telehealth: Payer: Self-pay | Admitting: *Deleted

## 2021-12-24 MED ORDER — OXYCODONE HCL 5 MG PO TABS: 5.0000 mg | ORAL_TABLET | ORAL | 0 refills | Status: DC | PRN

## 2021-12-24 NOTE — Telephone Encounter (Signed)
7 day post op call. Doing well. Pain moderate to severe at times. Requested refill of pain medication. Thanks.

## 2021-12-27 ENCOUNTER — Other Ambulatory Visit: Payer: Self-pay | Admitting: Orthopaedic Surgery

## 2021-12-27 ENCOUNTER — Telehealth: Payer: Self-pay | Admitting: *Deleted

## 2021-12-27 MED ORDER — HYDROMORPHONE HCL 4 MG PO TABS: 4.0000 mg | ORAL_TABLET | ORAL | 0 refills | Status: DC | PRN

## 2021-12-27 NOTE — Telephone Encounter (Signed)
Patient called requesting refill of pain medication; refill was sent into pharmacy on Friday, 12/24/21 (3 days ago). Reviewed medication dosing and she reports she is taking 2 tablets about every 4 hours as prescribed, which would be 12 in a day. I explained pharmacy may not fill, but I would send Dr. Ninfa Linden a message regarding refill request.
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
## 2021-12-30 ENCOUNTER — Ambulatory Visit (INDEPENDENT_AMBULATORY_CARE_PROVIDER_SITE_OTHER): Payer: PPO | Admitting: Orthopaedic Surgery

## 2021-12-30 ENCOUNTER — Encounter: Payer: Self-pay | Admitting: Rehabilitative and Restorative Service Providers"

## 2021-12-30 ENCOUNTER — Encounter: Payer: Self-pay | Admitting: Orthopaedic Surgery

## 2021-12-30 ENCOUNTER — Other Ambulatory Visit: Payer: Self-pay

## 2021-12-30 ENCOUNTER — Ambulatory Visit: Payer: PPO | Admitting: Rehabilitative and Restorative Service Providers"

## 2021-12-30 DIAGNOSIS — R262 Difficulty in walking, not elsewhere classified: Secondary | ICD-10-CM

## 2021-12-30 DIAGNOSIS — M25661 Stiffness of right knee, not elsewhere classified: Secondary | ICD-10-CM

## 2021-12-30 DIAGNOSIS — M6281 Muscle weakness (generalized): Secondary | ICD-10-CM

## 2021-12-30 DIAGNOSIS — R600 Localized edema: Secondary | ICD-10-CM

## 2021-12-30 DIAGNOSIS — M25561 Pain in right knee: Secondary | ICD-10-CM | POA: Diagnosis not present

## 2021-12-30 DIAGNOSIS — Z96651 Presence of right artificial knee joint: Secondary | ICD-10-CM

## 2021-12-30 DIAGNOSIS — G8929 Other chronic pain: Secondary | ICD-10-CM | POA: Diagnosis not present

## 2021-12-30 MED ORDER — TIZANIDINE HCL 4 MG PO TABS: 4.0000 mg | ORAL_TABLET | Freq: Four times a day (QID) | ORAL | 0 refills | Status: DC | PRN

## 2021-12-30 NOTE — Progress Notes (Signed)
The patent is now 2 weeks status post a right total knee replacement.  She has been having significant problems with postoperative pain to be expected at her young age.  She has enjoyed her normal therapy.  They have been able to flex her to about 85 degrees.  She has been on Dilaudid and Robaxin.  We both feel like she needs a different muscle relaxant.  She has been taking a baby aspirin twice a day.  She was on this once a day prior to surgery.  Her right knee shows the incision looks good.  We remove the staples in place Steri-Strips.  Her extension is almost full and I can only flex her to about 80 degrees today.  Her calf is soft.  She will transition out outpatient physical therapy.  I will try Zanaflex as a muscle relaxant for her.  I would like to see her back in 4 weeks to see how she is doing overall but no x-rays are needed.

## 2022-01-03 ENCOUNTER — Ambulatory Visit: Payer: PPO | Admitting: Physical Therapy

## 2022-01-03 ENCOUNTER — Other Ambulatory Visit: Payer: Self-pay

## 2022-01-03 ENCOUNTER — Encounter: Payer: Self-pay | Admitting: Physical Therapy

## 2022-01-03 DIAGNOSIS — M6281 Muscle weakness (generalized): Secondary | ICD-10-CM

## 2022-01-03 DIAGNOSIS — G8929 Other chronic pain: Secondary | ICD-10-CM

## 2022-01-03 DIAGNOSIS — R600 Localized edema: Secondary | ICD-10-CM | POA: Diagnosis not present

## 2022-01-03 DIAGNOSIS — R262 Difficulty in walking, not elsewhere classified: Secondary | ICD-10-CM | POA: Diagnosis not present

## 2022-01-03 DIAGNOSIS — M25561 Pain in right knee: Secondary | ICD-10-CM

## 2022-01-03 DIAGNOSIS — M25661 Stiffness of right knee, not elsewhere classified: Secondary | ICD-10-CM
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
## 2022-01-05 ENCOUNTER — Encounter: Payer: Self-pay | Admitting: Physical Therapy

## 2022-01-05 ENCOUNTER — Ambulatory Visit: Payer: PPO | Admitting: Physical Therapy

## 2022-01-05 ENCOUNTER — Other Ambulatory Visit: Payer: Self-pay

## 2022-01-05 DIAGNOSIS — M25661 Stiffness of right knee, not elsewhere classified: Secondary | ICD-10-CM | POA: Diagnosis not present

## 2022-01-05 DIAGNOSIS — M25561 Pain in right knee: Secondary | ICD-10-CM | POA: Diagnosis not present

## 2022-01-05 DIAGNOSIS — G8929 Other chronic pain: Secondary | ICD-10-CM | POA: Diagnosis not present

## 2022-01-05 DIAGNOSIS — R600 Localized edema: Secondary | ICD-10-CM | POA: Diagnosis not present

## 2022-01-05 DIAGNOSIS — M6281 Muscle weakness (generalized): Secondary | ICD-10-CM

## 2022-01-05 DIAGNOSIS — R262 Difficulty in walking, not elsewhere classified: Secondary | ICD-10-CM | POA: Diagnosis not present
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
## 2022-01-10 ENCOUNTER — Other Ambulatory Visit: Payer: Self-pay

## 2022-01-10 ENCOUNTER — Ambulatory Visit: Payer: PPO | Admitting: Physical Therapy

## 2022-01-10 ENCOUNTER — Encounter: Payer: Self-pay | Admitting: Physical Therapy

## 2022-01-10 DIAGNOSIS — G8929 Other chronic pain: Secondary | ICD-10-CM | POA: Diagnosis not present

## 2022-01-10 DIAGNOSIS — M25561 Pain in right knee: Secondary | ICD-10-CM

## 2022-01-10 DIAGNOSIS — M6281 Muscle weakness (generalized): Secondary | ICD-10-CM | POA: Diagnosis not present

## 2022-01-10 DIAGNOSIS — M25661 Stiffness of right knee, not elsewhere classified: Secondary | ICD-10-CM | POA: Diagnosis not present

## 2022-01-10 DIAGNOSIS — R600 Localized edema: Secondary | ICD-10-CM

## 2022-01-10 DIAGNOSIS — R262 Difficulty in walking, not elsewhere classified: Secondary | ICD-10-CM | POA: Diagnosis not present
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
## 2022-01-11 ENCOUNTER — Other Ambulatory Visit: Payer: Self-pay | Admitting: Orthopaedic Surgery

## 2022-01-13 ENCOUNTER — Other Ambulatory Visit: Payer: Self-pay

## 2022-01-13 ENCOUNTER — Ambulatory Visit: Payer: PPO | Admitting: Rehabilitative and Restorative Service Providers"

## 2022-01-13 ENCOUNTER — Encounter: Payer: Self-pay | Admitting: Rehabilitative and Restorative Service Providers"

## 2022-01-13 DIAGNOSIS — R262 Difficulty in walking, not elsewhere classified: Secondary | ICD-10-CM | POA: Diagnosis not present

## 2022-01-13 DIAGNOSIS — M6281 Muscle weakness (generalized): Secondary | ICD-10-CM | POA: Diagnosis not present

## 2022-01-13 DIAGNOSIS — G8929 Other chronic pain: Secondary | ICD-10-CM

## 2022-01-13 DIAGNOSIS — M25561 Pain in right knee: Secondary | ICD-10-CM

## 2022-01-13 DIAGNOSIS — R600 Localized edema: Secondary | ICD-10-CM | POA: Diagnosis not present

## 2022-01-13 DIAGNOSIS — M25661 Stiffness of right knee, not elsewhere classified: Secondary | ICD-10-CM
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
## 2022-01-17 ENCOUNTER — Encounter: Payer: Self-pay | Admitting: Rehabilitative and Restorative Service Providers"

## 2022-01-17 ENCOUNTER — Other Ambulatory Visit: Payer: Self-pay

## 2022-01-17 ENCOUNTER — Ambulatory Visit: Payer: PPO | Admitting: Rehabilitative and Restorative Service Providers"

## 2022-01-17 DIAGNOSIS — R600 Localized edema: Secondary | ICD-10-CM

## 2022-01-17 DIAGNOSIS — M6281 Muscle weakness (generalized): Secondary | ICD-10-CM | POA: Diagnosis not present

## 2022-01-17 DIAGNOSIS — R262 Difficulty in walking, not elsewhere classified: Secondary | ICD-10-CM | POA: Diagnosis not present

## 2022-01-17 DIAGNOSIS — M25661 Stiffness of right knee, not elsewhere classified: Secondary | ICD-10-CM | POA: Diagnosis not present

## 2022-01-17 DIAGNOSIS — G8929 Other chronic pain: Secondary | ICD-10-CM

## 2022-01-17 DIAGNOSIS — M25561 Pain in right knee: Secondary | ICD-10-CM
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
## 2022-01-19 ENCOUNTER — Encounter: Payer: Self-pay | Admitting: Rehabilitative and Restorative Service Providers"

## 2022-01-19 ENCOUNTER — Other Ambulatory Visit: Payer: Self-pay

## 2022-01-19 ENCOUNTER — Ambulatory Visit: Payer: PPO | Admitting: Rehabilitative and Restorative Service Providers"

## 2022-01-19 DIAGNOSIS — M25661 Stiffness of right knee, not elsewhere classified: Secondary | ICD-10-CM

## 2022-01-19 DIAGNOSIS — M6281 Muscle weakness (generalized): Secondary | ICD-10-CM

## 2022-01-19 DIAGNOSIS — R600 Localized edema: Secondary | ICD-10-CM | POA: Diagnosis not present

## 2022-01-19 DIAGNOSIS — R262 Difficulty in walking, not elsewhere classified: Secondary | ICD-10-CM

## 2022-01-19 DIAGNOSIS — G8929 Other chronic pain: Secondary | ICD-10-CM

## 2022-01-19 DIAGNOSIS — M25561 Pain in right knee: Secondary | ICD-10-CM | POA: Diagnosis not present
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
## 2022-01-21 ENCOUNTER — Telehealth: Payer: Self-pay | Admitting: *Deleted

## 2022-01-21 NOTE — Telephone Encounter (Signed)
Attempted Ortho bundle 30 day call to patient; left VM on home phone and requested call back.

## 2022-01-24 ENCOUNTER — Other Ambulatory Visit: Payer: Self-pay

## 2022-01-24 ENCOUNTER — Encounter: Payer: Self-pay | Admitting: Rehabilitative and Restorative Service Providers"

## 2022-01-24 ENCOUNTER — Ambulatory Visit: Payer: PPO | Admitting: Rehabilitative and Restorative Service Providers"

## 2022-01-24 DIAGNOSIS — M25561 Pain in right knee: Secondary | ICD-10-CM | POA: Diagnosis not present

## 2022-01-24 DIAGNOSIS — M25661 Stiffness of right knee, not elsewhere classified: Secondary | ICD-10-CM

## 2022-01-24 DIAGNOSIS — R262 Difficulty in walking, not elsewhere classified: Secondary | ICD-10-CM | POA: Diagnosis not present

## 2022-01-24 DIAGNOSIS — R600 Localized edema: Secondary | ICD-10-CM

## 2022-01-24 DIAGNOSIS — M6281 Muscle weakness (generalized): Secondary | ICD-10-CM | POA: Diagnosis not present

## 2022-01-24 DIAGNOSIS — G8929 Other chronic pain: Secondary | ICD-10-CM
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
## 2022-01-26 ENCOUNTER — Other Ambulatory Visit: Payer: Self-pay

## 2022-01-26 ENCOUNTER — Encounter: Payer: Self-pay | Admitting: Rehabilitative and Restorative Service Providers"

## 2022-01-26 ENCOUNTER — Ambulatory Visit: Payer: PPO | Admitting: Rehabilitative and Restorative Service Providers"

## 2022-01-26 DIAGNOSIS — G8929 Other chronic pain: Secondary | ICD-10-CM

## 2022-01-26 DIAGNOSIS — M6281 Muscle weakness (generalized): Secondary | ICD-10-CM | POA: Diagnosis not present

## 2022-01-26 DIAGNOSIS — R600 Localized edema: Secondary | ICD-10-CM

## 2022-01-26 DIAGNOSIS — M25661 Stiffness of right knee, not elsewhere classified: Secondary | ICD-10-CM

## 2022-01-26 DIAGNOSIS — R262 Difficulty in walking, not elsewhere classified: Secondary | ICD-10-CM

## 2022-01-26 DIAGNOSIS — M25561 Pain in right knee: Secondary | ICD-10-CM
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
## 2022-01-27 ENCOUNTER — Ambulatory Visit (INDEPENDENT_AMBULATORY_CARE_PROVIDER_SITE_OTHER): Payer: PPO | Admitting: Orthopaedic Surgery

## 2022-01-27 ENCOUNTER — Telehealth: Payer: Self-pay | Admitting: *Deleted

## 2022-01-27 ENCOUNTER — Other Ambulatory Visit: Payer: Self-pay | Admitting: Cardiovascular Disease

## 2022-01-27 ENCOUNTER — Encounter: Payer: Self-pay | Admitting: Orthopaedic Surgery

## 2022-01-27 DIAGNOSIS — Z96651 Presence of right artificial knee joint: Secondary | ICD-10-CM

## 2022-01-27 DIAGNOSIS — T8482XA Fibrosis due to internal orthopedic prosthetic devices, implants and grafts, initial encounter: Secondary | ICD-10-CM

## 2022-01-27 MED ORDER — BACLOFEN 10 MG PO TABS: 10.0000 mg | ORAL_TABLET | Freq: Three times a day (TID) | ORAL | 1 refills | Status: DC | PRN

## 2022-01-27 NOTE — Addendum Note (Signed)
Addended by: Jean Rosenthal on: 01/27/2022 03:26 PM   Modules accepted: Orders

## 2022-01-27 NOTE — Telephone Encounter (Signed)
Ortho bundle 30 day in person visit completed.

## 2022-01-27 NOTE — Progress Notes (Signed)
The patient will be 6 weeks tomorrow status post a right total knee arthroplasty.  She reports that she has been only able to flex it to about 85 degrees in outpatient therapy and she has been diligent in hard trying to get her knee bending better.  She is 71 years old.  On my exam today she lacks full extension by few degrees and I can only flex her to about 85 degrees.  At this point I have recommended a manipulation under anesthesia of the right knee.  I explained what this involves and the risks and benefits of this type of surgery.  I think it is essential in order to get her motion better.  She would then have physical therapy again soon after that and push herself harder through physical therapy which she is trying to do.  We will work on getting that scheduled soon.  We would then see her back 2 weeks postoperative from the manipulation that at that visit we would have an AP and lateral of her right knee.  All questions and concerns were answered and addressed.

## 2022-01-31 ENCOUNTER — Encounter: Payer: PPO | Admitting: Rehabilitative and Restorative Service Providers"

## 2022-02-01 ENCOUNTER — Other Ambulatory Visit: Payer: Self-pay | Admitting: Cardiovascular Disease

## 2022-02-02 ENCOUNTER — Encounter: Payer: PPO | Admitting: Rehabilitative and Restorative Service Providers"
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
## 2022-02-03 ENCOUNTER — Other Ambulatory Visit: Payer: Self-pay | Admitting: Orthopaedic Surgery

## 2022-02-03 DIAGNOSIS — Z96651 Presence of right artificial knee joint: Secondary | ICD-10-CM | POA: Diagnosis not present

## 2022-02-03 DIAGNOSIS — M24661 Ankylosis, right knee: Secondary | ICD-10-CM | POA: Diagnosis not present

## 2022-02-03 DIAGNOSIS — G8918 Other acute postprocedural pain: Secondary | ICD-10-CM | POA: Diagnosis not present

## 2022-02-03 DIAGNOSIS — T8482XA Fibrosis due to internal orthopedic prosthetic devices, implants and grafts, initial encounter: Secondary | ICD-10-CM | POA: Diagnosis not present

## 2022-02-03 MED ORDER — TIZANIDINE HCL 4 MG PO TABS: 4.0000 mg | ORAL_TABLET | Freq: Four times a day (QID) | ORAL | 1 refills | Status: DC | PRN

## 2022-02-04 ENCOUNTER — Ambulatory Visit: Payer: PPO | Admitting: Rehabilitative and Restorative Service Providers"

## 2022-02-04 ENCOUNTER — Other Ambulatory Visit: Payer: Self-pay

## 2022-02-04 ENCOUNTER — Encounter: Payer: Self-pay | Admitting: Rehabilitative and Restorative Service Providers"

## 2022-02-04 DIAGNOSIS — M25561 Pain in right knee: Secondary | ICD-10-CM

## 2022-02-04 DIAGNOSIS — G8929 Other chronic pain: Secondary | ICD-10-CM | POA: Diagnosis not present

## 2022-02-04 DIAGNOSIS — M6281 Muscle weakness (generalized): Secondary | ICD-10-CM

## 2022-02-04 DIAGNOSIS — M25661 Stiffness of right knee, not elsewhere classified: Secondary | ICD-10-CM | POA: Diagnosis not present

## 2022-02-04 DIAGNOSIS — R262 Difficulty in walking, not elsewhere classified: Secondary | ICD-10-CM

## 2022-02-04 DIAGNOSIS — R600 Localized edema: Secondary | ICD-10-CM | POA: Diagnosis not present

## 2022-02-07 ENCOUNTER — Ambulatory Visit: Payer: PPO | Admitting: Physical Therapy

## 2022-02-07 ENCOUNTER — Encounter: Payer: Self-pay | Admitting: Physical Therapy

## 2022-02-07 ENCOUNTER — Other Ambulatory Visit: Payer: Self-pay

## 2022-02-07 DIAGNOSIS — R600 Localized edema: Secondary | ICD-10-CM | POA: Diagnosis not present

## 2022-02-07 DIAGNOSIS — R262 Difficulty in walking, not elsewhere classified: Secondary | ICD-10-CM | POA: Diagnosis not present

## 2022-02-07 DIAGNOSIS — M6281 Muscle weakness (generalized): Secondary | ICD-10-CM | POA: Diagnosis not present

## 2022-02-07 DIAGNOSIS — G8929 Other chronic pain: Secondary | ICD-10-CM

## 2022-02-07 DIAGNOSIS — M25661 Stiffness of right knee, not elsewhere classified: Secondary | ICD-10-CM

## 2022-02-07 DIAGNOSIS — E785 Hyperlipidemia, unspecified: Secondary | ICD-10-CM | POA: Diagnosis not present

## 2022-02-07 DIAGNOSIS — M25561 Pain in right knee: Secondary | ICD-10-CM

## 2022-02-07 DIAGNOSIS — I10 Essential (primary) hypertension: Secondary | ICD-10-CM | POA: Diagnosis not present
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
## 2022-02-08 ENCOUNTER — Ambulatory Visit (INDEPENDENT_AMBULATORY_CARE_PROVIDER_SITE_OTHER): Payer: PPO | Admitting: Rehabilitative and Restorative Service Providers"

## 2022-02-08 ENCOUNTER — Encounter: Payer: Self-pay | Admitting: Rehabilitative and Restorative Service Providers"

## 2022-02-08 DIAGNOSIS — M6281 Muscle weakness (generalized): Secondary | ICD-10-CM | POA: Diagnosis not present

## 2022-02-08 DIAGNOSIS — G8929 Other chronic pain: Secondary | ICD-10-CM | POA: Diagnosis not present

## 2022-02-08 DIAGNOSIS — M25661 Stiffness of right knee, not elsewhere classified: Secondary | ICD-10-CM | POA: Diagnosis not present

## 2022-02-08 DIAGNOSIS — R262 Difficulty in walking, not elsewhere classified: Secondary | ICD-10-CM

## 2022-02-08 DIAGNOSIS — M25561 Pain in right knee: Secondary | ICD-10-CM

## 2022-02-08 DIAGNOSIS — R600 Localized edema: Secondary | ICD-10-CM | POA: Diagnosis not present
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
## 2022-02-09 ENCOUNTER — Other Ambulatory Visit: Payer: Self-pay

## 2022-02-09 ENCOUNTER — Ambulatory Visit (INDEPENDENT_AMBULATORY_CARE_PROVIDER_SITE_OTHER): Payer: PPO | Admitting: Rehabilitative and Restorative Service Providers"

## 2022-02-09 ENCOUNTER — Encounter: Payer: Self-pay | Admitting: Rehabilitative and Restorative Service Providers"

## 2022-02-09 DIAGNOSIS — R600 Localized edema: Secondary | ICD-10-CM

## 2022-02-09 DIAGNOSIS — G8929 Other chronic pain: Secondary | ICD-10-CM | POA: Diagnosis not present

## 2022-02-09 DIAGNOSIS — M25661 Stiffness of right knee, not elsewhere classified: Secondary | ICD-10-CM

## 2022-02-09 DIAGNOSIS — R262 Difficulty in walking, not elsewhere classified: Secondary | ICD-10-CM

## 2022-02-09 DIAGNOSIS — M6281 Muscle weakness (generalized): Secondary | ICD-10-CM

## 2022-02-09 DIAGNOSIS — M25561 Pain in right knee: Secondary | ICD-10-CM
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
## 2022-02-10 ENCOUNTER — Encounter: Payer: Self-pay | Admitting: Physical Therapy

## 2022-02-10 ENCOUNTER — Ambulatory Visit (INDEPENDENT_AMBULATORY_CARE_PROVIDER_SITE_OTHER): Payer: PPO | Admitting: Physical Therapy

## 2022-02-10 ENCOUNTER — Other Ambulatory Visit: Payer: Self-pay | Admitting: Orthopaedic Surgery

## 2022-02-10 DIAGNOSIS — M6281 Muscle weakness (generalized): Secondary | ICD-10-CM | POA: Diagnosis not present

## 2022-02-10 DIAGNOSIS — R262 Difficulty in walking, not elsewhere classified: Secondary | ICD-10-CM

## 2022-02-10 DIAGNOSIS — M25561 Pain in right knee: Secondary | ICD-10-CM

## 2022-02-10 DIAGNOSIS — R600 Localized edema: Secondary | ICD-10-CM | POA: Diagnosis not present

## 2022-02-10 DIAGNOSIS — G8929 Other chronic pain: Secondary | ICD-10-CM

## 2022-02-10 DIAGNOSIS — M25661 Stiffness of right knee, not elsewhere classified: Secondary | ICD-10-CM
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
## 2022-02-11 ENCOUNTER — Encounter: Payer: Self-pay | Admitting: Rehabilitative and Restorative Service Providers"

## 2022-02-11 ENCOUNTER — Other Ambulatory Visit: Payer: Self-pay

## 2022-02-11 ENCOUNTER — Ambulatory Visit: Payer: PPO | Admitting: Rehabilitative and Restorative Service Providers"

## 2022-02-11 DIAGNOSIS — G8929 Other chronic pain: Secondary | ICD-10-CM | POA: Diagnosis not present

## 2022-02-11 DIAGNOSIS — K219 Gastro-esophageal reflux disease without esophagitis: Secondary | ICD-10-CM | POA: Diagnosis not present

## 2022-02-11 DIAGNOSIS — R262 Difficulty in walking, not elsewhere classified: Secondary | ICD-10-CM | POA: Diagnosis not present

## 2022-02-11 DIAGNOSIS — M5380 Other specified dorsopathies, site unspecified: Secondary | ICD-10-CM | POA: Diagnosis not present

## 2022-02-11 DIAGNOSIS — Z23 Encounter for immunization: Secondary | ICD-10-CM | POA: Diagnosis not present

## 2022-02-11 DIAGNOSIS — N1831 Chronic kidney disease, stage 3a: Secondary | ICD-10-CM | POA: Diagnosis not present

## 2022-02-11 DIAGNOSIS — M25661 Stiffness of right knee, not elsewhere classified: Secondary | ICD-10-CM

## 2022-02-11 DIAGNOSIS — Z1331 Encounter for screening for depression: Secondary | ICD-10-CM | POA: Diagnosis not present

## 2022-02-11 DIAGNOSIS — M1990 Unspecified osteoarthritis, unspecified site: Secondary | ICD-10-CM | POA: Diagnosis not present

## 2022-02-11 DIAGNOSIS — M6281 Muscle weakness (generalized): Secondary | ICD-10-CM | POA: Diagnosis not present

## 2022-02-11 DIAGNOSIS — Z0000 Encounter for general adult medical examination without abnormal findings: Secondary | ICD-10-CM | POA: Diagnosis not present

## 2022-02-11 DIAGNOSIS — M25561 Pain in right knee: Secondary | ICD-10-CM | POA: Diagnosis not present

## 2022-02-11 DIAGNOSIS — R600 Localized edema: Secondary | ICD-10-CM | POA: Diagnosis not present

## 2022-02-11 DIAGNOSIS — K635 Polyp of colon: Secondary | ICD-10-CM | POA: Diagnosis not present

## 2022-02-11 DIAGNOSIS — E785 Hyperlipidemia, unspecified: Secondary | ICD-10-CM | POA: Diagnosis not present

## 2022-02-11 DIAGNOSIS — I129 Hypertensive chronic kidney disease with stage 1 through stage 4 chronic kidney disease, or unspecified chronic kidney disease: Secondary | ICD-10-CM | POA: Diagnosis not present

## 2022-02-11 DIAGNOSIS — I213 ST elevation (STEMI) myocardial infarction of unspecified site: Secondary | ICD-10-CM | POA: Diagnosis not present

## 2022-02-11 DIAGNOSIS — J302 Other seasonal allergic rhinitis: Secondary | ICD-10-CM | POA: Diagnosis not present

## 2022-02-11 DIAGNOSIS — R82998 Other abnormal findings in urine: Secondary | ICD-10-CM | POA: Diagnosis not present
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
## 2022-02-12 ENCOUNTER — Other Ambulatory Visit: Payer: Self-pay | Admitting: Cardiovascular Disease

## 2022-02-14 ENCOUNTER — Ambulatory Visit: Payer: PPO | Admitting: Rehabilitative and Restorative Service Providers"

## 2022-02-14 ENCOUNTER — Other Ambulatory Visit: Payer: Self-pay

## 2022-02-14 ENCOUNTER — Encounter: Payer: Self-pay | Admitting: Rehabilitative and Restorative Service Providers"

## 2022-02-14 DIAGNOSIS — G8929 Other chronic pain: Secondary | ICD-10-CM

## 2022-02-14 DIAGNOSIS — M25561 Pain in right knee: Secondary | ICD-10-CM

## 2022-02-14 DIAGNOSIS — R262 Difficulty in walking, not elsewhere classified: Secondary | ICD-10-CM

## 2022-02-14 DIAGNOSIS — M6281 Muscle weakness (generalized): Secondary | ICD-10-CM | POA: Diagnosis not present

## 2022-02-14 DIAGNOSIS — M25661 Stiffness of right knee, not elsewhere classified: Secondary | ICD-10-CM

## 2022-02-14 DIAGNOSIS — R600 Localized edema: Secondary | ICD-10-CM | POA: Diagnosis not present
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
## 2022-02-15 ENCOUNTER — Encounter: Payer: Self-pay | Admitting: Rehabilitative and Restorative Service Providers"

## 2022-02-15 ENCOUNTER — Ambulatory Visit: Payer: PPO | Admitting: Rehabilitative and Restorative Service Providers"

## 2022-02-15 DIAGNOSIS — R600 Localized edema: Secondary | ICD-10-CM

## 2022-02-15 DIAGNOSIS — R262 Difficulty in walking, not elsewhere classified: Secondary | ICD-10-CM

## 2022-02-15 DIAGNOSIS — M6281 Muscle weakness (generalized): Secondary | ICD-10-CM

## 2022-02-15 DIAGNOSIS — M25561 Pain in right knee: Secondary | ICD-10-CM

## 2022-02-15 DIAGNOSIS — G8929 Other chronic pain: Secondary | ICD-10-CM | POA: Diagnosis not present

## 2022-02-15 DIAGNOSIS — M25661 Stiffness of right knee, not elsewhere classified: Secondary | ICD-10-CM | POA: Diagnosis not present
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
## 2022-02-17 ENCOUNTER — Ambulatory Visit (INDEPENDENT_AMBULATORY_CARE_PROVIDER_SITE_OTHER): Payer: PPO

## 2022-02-17 ENCOUNTER — Ambulatory Visit (INDEPENDENT_AMBULATORY_CARE_PROVIDER_SITE_OTHER): Payer: PPO | Admitting: Rehabilitative and Restorative Service Providers"

## 2022-02-17 ENCOUNTER — Ambulatory Visit (INDEPENDENT_AMBULATORY_CARE_PROVIDER_SITE_OTHER): Payer: PPO | Admitting: Physician Assistant

## 2022-02-17 ENCOUNTER — Other Ambulatory Visit: Payer: Self-pay

## 2022-02-17 ENCOUNTER — Encounter: Payer: Self-pay | Admitting: Rehabilitative and Restorative Service Providers"

## 2022-02-17 ENCOUNTER — Encounter: Payer: Self-pay | Admitting: Physician Assistant

## 2022-02-17 DIAGNOSIS — R262 Difficulty in walking, not elsewhere classified: Secondary | ICD-10-CM | POA: Diagnosis not present

## 2022-02-17 DIAGNOSIS — G8929 Other chronic pain: Secondary | ICD-10-CM | POA: Diagnosis not present

## 2022-02-17 DIAGNOSIS — M25561 Pain in right knee: Secondary | ICD-10-CM | POA: Diagnosis not present

## 2022-02-17 DIAGNOSIS — Z9889 Other specified postprocedural states: Secondary | ICD-10-CM

## 2022-02-17 DIAGNOSIS — M6281 Muscle weakness (generalized): Secondary | ICD-10-CM | POA: Diagnosis not present

## 2022-02-17 DIAGNOSIS — Z96651 Presence of right artificial knee joint: Secondary | ICD-10-CM

## 2022-02-17 DIAGNOSIS — M25661 Stiffness of right knee, not elsewhere classified: Secondary | ICD-10-CM

## 2022-02-17 DIAGNOSIS — R600 Localized edema: Secondary | ICD-10-CM | POA: Diagnosis not present

## 2022-02-17 NOTE — Progress Notes (Addendum)
HPI: [name] returns today status post right knee manipulation.She been taking 4 times a day been taking since last she is status post right total knee arthroplasty 12/17/2021.  She states she is overall doing well.  Just has some achiness in the knee ranks it to be 2 out of 10 pain at worst.  She is going to physical therapy and feels that this been beneficial.  She is asking to return to work full duties as of April 4. ? ?Physical exam: Right knee full extension flexion to 105 degrees.  No gross instability valgus varus stressing.  Calf supple nontender.  Surgical incisions healed well no signs of infection or wound dehiscence. ? ?Radiographs: ?Right knee 2 views: No acute fractures.  Knee is well located.  Components well-seated. ? ? ?Impression: Status post right total knee arthroplasty 12/17/2021. ?2 weeks status post right knee manipulation ? ?Plan: She will continue to work on range of motion strengthening the knee.  We will see her back in 4 weeks sooner if there is any questions concerns.  She was given a note to return to work per her request on April 4 full duties. ?
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
## 2022-02-18 ENCOUNTER — Encounter: Payer: PPO | Admitting: Rehabilitative and Restorative Service Providers"

## 2022-02-23 ENCOUNTER — Other Ambulatory Visit: Payer: Self-pay | Admitting: Orthopaedic Surgery

## 2022-02-26 ENCOUNTER — Other Ambulatory Visit: Payer: Self-pay | Admitting: Cardiovascular Disease

## 2022-02-28 ENCOUNTER — Other Ambulatory Visit: Payer: Self-pay | Admitting: Orthopaedic Surgery

## 2022-02-28 ENCOUNTER — Other Ambulatory Visit: Payer: Self-pay

## 2022-02-28 ENCOUNTER — Telehealth: Payer: Self-pay | Admitting: Cardiovascular Disease

## 2022-02-28 MED ORDER — OMEGA-3-ACID ETHYL ESTERS 1 G PO CAPS: 1.0000 g | ORAL_CAPSULE | Freq: Two times a day (BID) | ORAL | 2 refills | Status: DC

## 2022-02-28 NOTE — Telephone Encounter (Signed)
? ?*  STAT* If patient is at the pharmacy, call can be transferred to refill team. ? ? ?1. Which medications need to be refilled? (please list name of each medication and dose if known) omega-3 acid ethyl esters (LOVAZA) 1 g capsule ? ?2. Which pharmacy/location (including street and city if local pharmacy) is medication to be sent to? [id number] [place] ?3. Do they need a 30 day or 90 day supply? 90 days ? ?Last refill was sent on 02/01/22 but its says transmission failed  ?

## 2022-03-03 ENCOUNTER — Ambulatory Visit (INDEPENDENT_AMBULATORY_CARE_PROVIDER_SITE_OTHER): Payer: PPO | Admitting: Physical Therapy

## 2022-03-03 ENCOUNTER — Encounter: Payer: Self-pay | Admitting: Physical Therapy

## 2022-03-03 ENCOUNTER — Other Ambulatory Visit: Payer: Self-pay

## 2022-03-03 DIAGNOSIS — M25661 Stiffness of right knee, not elsewhere classified: Secondary | ICD-10-CM | POA: Diagnosis not present

## 2022-03-03 DIAGNOSIS — M25561 Pain in right knee: Secondary | ICD-10-CM

## 2022-03-03 DIAGNOSIS — M6281 Muscle weakness (generalized): Secondary | ICD-10-CM | POA: Diagnosis not present

## 2022-03-03 DIAGNOSIS — R262 Difficulty in walking, not elsewhere classified: Secondary | ICD-10-CM | POA: Diagnosis not present

## 2022-03-03 DIAGNOSIS — R600 Localized edema: Secondary | ICD-10-CM | POA: Diagnosis not present

## 2022-03-03 DIAGNOSIS — G8929 Other chronic pain: Secondary | ICD-10-CM | POA: Diagnosis not present
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
## 2022-03-17 ENCOUNTER — Telehealth: Payer: Self-pay | Admitting: *Deleted

## 2022-03-17 ENCOUNTER — Ambulatory Visit (INDEPENDENT_AMBULATORY_CARE_PROVIDER_SITE_OTHER): Payer: PPO | Admitting: Physician Assistant

## 2022-03-17 ENCOUNTER — Encounter: Payer: Self-pay | Admitting: Physician Assistant

## 2022-03-17 DIAGNOSIS — Z9889 Other specified postprocedural states: Secondary | ICD-10-CM

## 2022-03-17 DIAGNOSIS — Z96651 Presence of right artificial knee joint: Secondary | ICD-10-CM

## 2022-03-17 NOTE — Progress Notes (Signed)
HPI: [name] [name] returns today 6 weeks status postmanipulation of her right total knee arthroplasty.  She overall is doing well.  She states she has no pain to some achiness in the knee at times.  She continues to work on range of motion and strengthening of the knee. ? ?Physical exam: Right knee full extension flexion to 110 115 degrees.  No gross instability valgus varus stressing.  Surgical incisions well-healed.  She does have a slight rash to the lateral aspect of the mid incision but no abnormal warmth erythema or effusion. ? ? ?Impression: Status post right total knee arthroplasty ?Status post right total knee arthroplasty manipulation under anesthesia ? ?Plan: She will continue work on range of motion strengthening.  She will follow-up with [place] in 1 month to see how she is doing overall.  Discussed with her applying hydrocortisone cream over-the-counter to the rash.  Questions were encouraged and answered. ? ?

## 2022-03-17 NOTE — Telephone Encounter (Signed)
Ortho bundle 90 day in office meeting completed. 

## 2022-03-17 NOTE — Addendum Note (Signed)
Addended by: Erskine Emery on: 03/17/2022 04:41 PM ? ? Modules accepted: Orders ? ?

## 2022-04-07 ENCOUNTER — Ambulatory Visit (INDEPENDENT_AMBULATORY_CARE_PROVIDER_SITE_OTHER): Payer: PPO | Admitting: Cardiovascular Disease

## 2022-04-07 ENCOUNTER — Encounter: Payer: Self-pay | Admitting: Cardiovascular Disease

## 2022-04-07 VITALS — BP 128/72 | HR 75 | Ht 64.0 in | Wt 185.8 lb

## 2022-04-07 DIAGNOSIS — I2510 Atherosclerotic heart disease of native coronary artery without angina pectoris: Secondary | ICD-10-CM

## 2022-04-07 DIAGNOSIS — E782 Mixed hyperlipidemia: Secondary | ICD-10-CM | POA: Diagnosis not present

## 2022-04-07 DIAGNOSIS — I10 Essential (primary) hypertension: Secondary | ICD-10-CM | POA: Diagnosis not present
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
## 2022-04-07 NOTE — Patient Instructions (Signed)
Medication Instructions:  ?Dr. Sallyanne Kuster recommends Repatha or Praluent (PCSK9). This is an injectable cholesterol medication. This medication will need prior approval with your insurance company, which we will work on. If the medication is not approved initially, we may need to do an appeal with your insurance. We will keep you updated on this process. This medication can be provided at some local pharmacies or be shipped to you from a specialty pharmacy.  ? ?STOP the Zetia ?STOP the Lovaza ? ?*If you need a refill on your cardiac medications before your next appointment, please call your pharmacy* ? ? ?Lab Work: ?None ordered-labs at PCP ? ?If you have labs (blood work) drawn today and your tests are completely normal, you will receive your results only by: ?MyChart Message (if you have MyChart) OR ?A paper copy in the mail ?If you have any lab test that is abnormal or we need to change your treatment, we will call you to review the results. ? ? ?Testing/Procedures: ?None ordered ? ? ?Follow-Up: ?At [facility], you and your health needs are our priority.  As part of our continuing mission to provide you with exceptional heart care, we have created designated Provider Care Teams.  These Care Teams include your primary Cardiologist (physician) and Advanced Practice Providers (APPs -  Physician Assistants and Nurse Practitioners) who all work together to provide you with the care you need, when you need it. ? ?We recommend signing up for the patient portal called "MyChart".  Sign up information is provided on this After Visit Summary.  MyChart is used to connect with patients for Virtual Visits (Telemedicine).  Patients are able to view lab/test results, encounter notes, upcoming appointments, etc.  Non-urgent messages can be sent to your provider as well.   ?To learn more about what you can do with MyChart, go to [email or web address].   ? ?Your next appointment:   ?12 month(s) ? ?The format for your next  appointment:   ?In Person ? ?Provider:   ?Sanda Klein, MD { ? ? ?Other Instructions ?A referral has been placed to our pharmacy to discuss starting [place] or Praluent ? ? ?

## 2022-04-13 ENCOUNTER — Encounter: Payer: Self-pay | Admitting: Pharmacist

## 2022-04-13 ENCOUNTER — Ambulatory Visit: Payer: PPO | Admitting: Pharmacist

## 2022-04-13 VITALS — BP 138/84 | HR 63 | Resp 14 | Ht 64.0 in | Wt 187.4 lb

## 2022-04-13 DIAGNOSIS — I214 Non-ST elevation (NSTEMI) myocardial infarction: Secondary | ICD-10-CM | POA: Diagnosis not present

## 2022-04-13 DIAGNOSIS — I2510 Atherosclerotic heart disease of native coronary artery without angina pectoris: Secondary | ICD-10-CM

## 2022-04-13 DIAGNOSIS — E782 Mixed hyperlipidemia: Secondary | ICD-10-CM | POA: Diagnosis not present

## 2022-04-13 NOTE — Patient Instructions (Addendum)
It was nice meeting you today ? ?We would like your LDL to be less than 70 ? ?We would like to start you on a new medication called Repatha ? ?We will complete the prior authorization for you and contact you when it is approved ? ?Once you start the medication we will recheck your cholesterol in 2-3 months ? ?Please call with any questions ? ?Karren Cobble, PharmD, BCACP, [place], CPP ?[address], [address] ?[place], [place], [address] ?Phone: [phone], Fax: [phone]  ?
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
## 2022-04-14 ENCOUNTER — Encounter: Payer: Self-pay | Admitting: Orthopaedic Surgery

## 2022-04-14 ENCOUNTER — Ambulatory Visit: Payer: PPO | Admitting: Orthopaedic Surgery

## 2022-04-14 DIAGNOSIS — Z96651 Presence of right artificial knee joint: Secondary | ICD-10-CM

## 2022-04-14 NOTE — Progress Notes (Signed)
The patient is now 4 months status post a right total knee arthroplasty.  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  She has turned the corner in terms of getting her range of motion back.  She is only taking tramadol for pain.  She is doing much better overall.  We still have had her on muscle relaxants as well.  At her last visit there was a rash that was noted to the lateral aspect of her knee.  This may have been more eczema which she has had in the past.  Creams that she has is helped this cleared up.  She still having some itching but overall looks better. ? ?On exam her extension is almost full and her flexion is almost full of her right knee.  There is swelling to be expected but it does feel stable and overall she looks much better.  She is ambulating without an assistive device. ? ?At this point we will see her back in 3 months to see how she is doing overall.  We will have a standing AP and lateral of her right knee at that visit. ?

## 2022-04-30 ENCOUNTER — Other Ambulatory Visit: Payer: Self-pay | Admitting: Cardiovascular Disease

## 2022-05-05 ENCOUNTER — Telehealth: Payer: Self-pay | Admitting: Pharmacist

## 2022-05-05 ENCOUNTER — Telehealth: Payer: Self-pay

## 2022-05-05 DIAGNOSIS — I2510 Atherosclerotic heart disease of native coronary artery without angina pectoris: Secondary | ICD-10-CM

## 2022-05-05 DIAGNOSIS — I214 Non-ST elevation (NSTEMI) myocardial infarction: Secondary | ICD-10-CM

## 2022-05-05 MED ORDER — REPATHA SURECLICK 140 MG/ML ~~LOC~~ SOAJ: 1.0000 mL | SUBCUTANEOUS | 1 refills | Status: DC

## 2022-05-05 NOTE — Telephone Encounter (Signed)
Calling to speak w/dr. Evelene Croon in regards to [id number] she never heard back from him and I was never given orders to do a pa so I will route to him to see if he would like for me to complete a pa and to order labs

## 2022-05-05 NOTE — Telephone Encounter (Signed)
PA for Repatha approved through 11/01/22.  Sent to [place].  Patient will have labs updated at PCP.

## 2022-05-13 ENCOUNTER — Other Ambulatory Visit: Payer: Self-pay | Admitting: Cardiovascular Disease

## 2022-07-18 ENCOUNTER — Ambulatory Visit (INDEPENDENT_AMBULATORY_CARE_PROVIDER_SITE_OTHER): Payer: PPO

## 2022-07-18 ENCOUNTER — Encounter: Payer: Self-pay | Admitting: Physician Assistant

## 2022-07-18 ENCOUNTER — Ambulatory Visit (INDEPENDENT_AMBULATORY_CARE_PROVIDER_SITE_OTHER): Payer: PPO | Admitting: Physician Assistant

## 2022-07-18 DIAGNOSIS — Z96651 Presence of right artificial knee joint: Secondary | ICD-10-CM

## 2022-07-18 NOTE — Progress Notes (Signed)
HPI: [name] [name] returns today status post right total knee arthroplasty 10/07/2022 and manipulation of the right knee under anesthesia on February 03, 2022.  She still has some stiffness and some pain in the medial and lateral aspect of the knee.  She is doing no exercises per se for the knee.  She does state that she has no pain in the knee itself.  Review of systems: See HPI otherwise negative  Physical exam:  General: Well-developed well-nourished female no acute distress mood affect appropriate Right knee full extension flexion to 110 degrees.  Calf supple nontender.  No instability valgus varus stressing knee.  Surgical incisions well-healed.  Ambulates without any assistive device.  Radiographs: AP lateral view right knee: Shows well seated right total knee arthroplasty components.  No hardware failure no acute findings.  Knee is well located.  No bony abnormalities.  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  We will see her back in 1 year anniversary and at that time obtain AP and lateral views of the right knee.  We will see her sooner if there is any questions concerns.

## 2022-08-15 DIAGNOSIS — N1831 Chronic kidney disease, stage 3a: Secondary | ICD-10-CM | POA: Diagnosis not present

## 2022-08-15 DIAGNOSIS — I213 ST elevation (STEMI) myocardial infarction of unspecified site: Secondary | ICD-10-CM | POA: Diagnosis not present

## 2022-08-15 DIAGNOSIS — D72819 Decreased white blood cell count, unspecified: Secondary | ICD-10-CM | POA: Diagnosis not present

## 2022-08-15 DIAGNOSIS — M1990 Unspecified osteoarthritis, unspecified site: Secondary | ICD-10-CM | POA: Diagnosis not present

## 2022-08-15 DIAGNOSIS — E785 Hyperlipidemia, unspecified: Secondary | ICD-10-CM | POA: Diagnosis not present

## 2022-08-15 DIAGNOSIS — M5380 Other specified dorsopathies, site unspecified: Secondary | ICD-10-CM | POA: Diagnosis not present

## 2022-08-15 DIAGNOSIS — I129 Hypertensive chronic kidney disease with stage 1 through stage 4 chronic kidney disease, or unspecified chronic kidney disease: Secondary | ICD-10-CM | POA: Diagnosis not present

## 2022-08-15 DIAGNOSIS — J302 Other seasonal allergic rhinitis: Secondary | ICD-10-CM | POA: Diagnosis not present

## 2022-08-15 DIAGNOSIS — K219 Gastro-esophageal reflux disease without esophagitis: Secondary | ICD-10-CM | POA: Diagnosis not present

## 2022-09-15 DIAGNOSIS — J069 Acute upper respiratory infection, unspecified: Secondary | ICD-10-CM | POA: Diagnosis not present

## 2022-09-15 DIAGNOSIS — R059 Cough, unspecified: Secondary | ICD-10-CM | POA: Diagnosis not present

## 2022-09-15 DIAGNOSIS — R0981 Nasal congestion: Secondary | ICD-10-CM | POA: Diagnosis not present

## 2022-09-15 DIAGNOSIS — M791 Myalgia, unspecified site: Secondary | ICD-10-CM | POA: Diagnosis not present

## 2022-09-15 DIAGNOSIS — J302 Other seasonal allergic rhinitis: Secondary | ICD-10-CM | POA: Diagnosis not present

## 2022-09-15 DIAGNOSIS — Z1152 Encounter for screening for COVID-19: Secondary | ICD-10-CM | POA: Diagnosis not present

## 2022-09-15 DIAGNOSIS — R5383 Other fatigue: Secondary | ICD-10-CM | POA: Diagnosis not present

## 2022-10-10 ENCOUNTER — Encounter: Payer: Self-pay | Admitting: Physician Assistant

## 2022-10-10 ENCOUNTER — Ambulatory Visit: Payer: PPO | Admitting: Physician Assistant

## 2022-10-10 DIAGNOSIS — Z96651 Presence of right artificial knee joint: Secondary | ICD-10-CM | POA: Diagnosis not present

## 2022-10-10 NOTE — Progress Notes (Signed)
HPI: [name] [name] comes in today for follow-up of her right total knee arthroplasty which was performed 10/07/2022.  She has no complaints.  We did obtain films on her knee back in August.  She has had no new injury.  She states she is doing well and back to activities such as golfing.  Takes no medication for the knee.  Review of systems: See HPI otherwise negative  Physical exam: General well-developed well-nourished female no acute distress at rest.  Ambulates without any assistive device. Psych: Alert and oriented x3  Right knee full extension flexion to 115 degrees easily.  No instability valgus varus stressing.  No abnormal warmth erythema or effusion.  Surgical incisions well-healed.  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  Follow-up with [place] as needed pain persist or becomes worse.  Questions were encouraged and answered

## 2022-10-20 ENCOUNTER — Other Ambulatory Visit: Payer: Self-pay | Admitting: Cardiovascular Disease

## 2022-10-20 DIAGNOSIS — I214 Non-ST elevation (NSTEMI) myocardial infarction: Secondary | ICD-10-CM

## 2022-10-20 DIAGNOSIS — I2510 Atherosclerotic heart disease of native coronary artery without angina pectoris: Secondary | ICD-10-CM

## 2022-11-18 ENCOUNTER — Telehealth: Payer: Self-pay | Admitting: Pharmacist Clinician (PhC)/ Clinical Pharmacy Specialist

## 2022-11-22 NOTE — Telephone Encounter (Signed)
PA for Repatha approved to 11/19/2023  Key [id number]

## 2022-11-23 ENCOUNTER — Other Ambulatory Visit: Payer: Self-pay | Admitting: Cardiovascular Disease

## 2022-12-11 ENCOUNTER — Other Ambulatory Visit: Payer: Self-pay | Admitting: Cardiovascular Disease

## 2023-02-09 ENCOUNTER — Encounter: Payer: Self-pay | Admitting: Radiology

## 2023-02-27 ENCOUNTER — Other Ambulatory Visit: Payer: Self-pay | Admitting: Cardiovascular Disease

## 2023-02-27 DIAGNOSIS — N1831 Chronic kidney disease, stage 3a: Secondary | ICD-10-CM | POA: Diagnosis not present

## 2023-02-27 DIAGNOSIS — D72819 Decreased white blood cell count, unspecified: Secondary | ICD-10-CM | POA: Diagnosis not present

## 2023-02-27 DIAGNOSIS — R7989 Other specified abnormal findings of blood chemistry: Secondary | ICD-10-CM | POA: Diagnosis not present

## 2023-02-27 DIAGNOSIS — E785 Hyperlipidemia, unspecified: Secondary | ICD-10-CM | POA: Diagnosis not present

## 2023-02-27 DIAGNOSIS — K219 Gastro-esophageal reflux disease without esophagitis: Secondary | ICD-10-CM | POA: Diagnosis not present

## 2023-02-28 DIAGNOSIS — J0100 Acute maxillary sinusitis, unspecified: Secondary | ICD-10-CM | POA: Diagnosis not present

## 2023-02-28 DIAGNOSIS — J302 Other seasonal allergic rhinitis: Secondary | ICD-10-CM | POA: Diagnosis not present

## 2023-03-06 DIAGNOSIS — I213 ST elevation (STEMI) myocardial infarction of unspecified site: Secondary | ICD-10-CM | POA: Diagnosis not present

## 2023-03-06 DIAGNOSIS — M1990 Unspecified osteoarthritis, unspecified site: Secondary | ICD-10-CM | POA: Diagnosis not present

## 2023-03-06 DIAGNOSIS — M5380 Other specified dorsopathies, site unspecified: Secondary | ICD-10-CM | POA: Diagnosis not present

## 2023-03-06 DIAGNOSIS — Z1331 Encounter for screening for depression: Secondary | ICD-10-CM | POA: Diagnosis not present

## 2023-03-06 DIAGNOSIS — I10 Essential (primary) hypertension: Secondary | ICD-10-CM | POA: Diagnosis not present

## 2023-03-06 DIAGNOSIS — J302 Other seasonal allergic rhinitis: Secondary | ICD-10-CM | POA: Diagnosis not present

## 2023-03-06 DIAGNOSIS — E785 Hyperlipidemia, unspecified: Secondary | ICD-10-CM | POA: Diagnosis not present

## 2023-03-06 DIAGNOSIS — N1831 Chronic kidney disease, stage 3a: Secondary | ICD-10-CM | POA: Diagnosis not present

## 2023-03-06 DIAGNOSIS — Z1339 Encounter for screening examination for other mental health and behavioral disorders: Secondary | ICD-10-CM | POA: Diagnosis not present

## 2023-03-06 DIAGNOSIS — Z0000 Encounter for general adult medical examination without abnormal findings: Secondary | ICD-10-CM | POA: Diagnosis not present

## 2023-03-06 DIAGNOSIS — I129 Hypertensive chronic kidney disease with stage 1 through stage 4 chronic kidney disease, or unspecified chronic kidney disease: Secondary | ICD-10-CM | POA: Diagnosis not present

## 2023-03-21 ENCOUNTER — Inpatient Hospital Stay (HOSPITAL_BASED_OUTPATIENT_CLINIC_OR_DEPARTMENT_OTHER)
Admission: EM | Admit: 2023-03-21 | Discharge: 2023-03-25 | DRG: 399 | Disposition: A | Discharge status: Home / Self Care | Payer: PPO | Attending: Surgery | Admitting: Surgery

## 2023-03-21 ENCOUNTER — Emergency Department (HOSPITAL_BASED_OUTPATIENT_CLINIC_OR_DEPARTMENT_OTHER): Payer: PPO

## 2023-03-21 ENCOUNTER — Other Ambulatory Visit: Payer: Self-pay

## 2023-03-21 ENCOUNTER — Encounter (HOSPITAL_BASED_OUTPATIENT_CLINIC_OR_DEPARTMENT_OTHER): Payer: Self-pay

## 2023-03-21 DIAGNOSIS — Z8249 Family history of ischemic heart disease and other diseases of the circulatory system: Secondary | ICD-10-CM | POA: Diagnosis not present

## 2023-03-21 DIAGNOSIS — K3533 Acute appendicitis with perforation and localized peritonitis, with abscess: Secondary | ICD-10-CM | POA: Diagnosis present

## 2023-03-21 DIAGNOSIS — E669 Obesity, unspecified: Secondary | ICD-10-CM | POA: Diagnosis present

## 2023-03-21 DIAGNOSIS — Z955 Presence of coronary angioplasty implant and graft: Secondary | ICD-10-CM

## 2023-03-21 DIAGNOSIS — Z96651 Presence of right artificial knee joint: Secondary | ICD-10-CM | POA: Diagnosis present

## 2023-03-21 DIAGNOSIS — K3532 Acute appendicitis with perforation and localized peritonitis, without abscess: Principal | ICD-10-CM | POA: Diagnosis present

## 2023-03-21 DIAGNOSIS — E7800 Pure hypercholesterolemia, unspecified: Secondary | ICD-10-CM | POA: Diagnosis present

## 2023-03-21 DIAGNOSIS — N200 Calculus of kidney: Secondary | ICD-10-CM | POA: Diagnosis not present

## 2023-03-21 DIAGNOSIS — Z87442 Personal history of urinary calculi: Secondary | ICD-10-CM | POA: Diagnosis not present

## 2023-03-21 DIAGNOSIS — Z888 Allergy status to other drugs, medicaments and biological substances status: Secondary | ICD-10-CM | POA: Diagnosis not present

## 2023-03-21 DIAGNOSIS — Z6832 Body mass index (BMI) 32.0-32.9, adult: Secondary | ICD-10-CM

## 2023-03-21 DIAGNOSIS — Z1152 Encounter for screening for COVID-19: Secondary | ICD-10-CM

## 2023-03-21 DIAGNOSIS — Z79899 Other long term (current) drug therapy: Secondary | ICD-10-CM | POA: Diagnosis not present

## 2023-03-21 DIAGNOSIS — M545 Low back pain, unspecified: Secondary | ICD-10-CM | POA: Diagnosis present

## 2023-03-21 DIAGNOSIS — I10 Essential (primary) hypertension: Secondary | ICD-10-CM | POA: Diagnosis present

## 2023-03-21 DIAGNOSIS — K37 Unspecified appendicitis: Secondary | ICD-10-CM | POA: Diagnosis present

## 2023-03-21 DIAGNOSIS — I2510 Atherosclerotic heart disease of native coronary artery without angina pectoris: Secondary | ICD-10-CM | POA: Diagnosis present

## 2023-03-21 DIAGNOSIS — R1030 Lower abdominal pain, unspecified: Secondary | ICD-10-CM | POA: Diagnosis not present

## 2023-03-21 DIAGNOSIS — Z85828 Personal history of other malignant neoplasm of skin: Secondary | ICD-10-CM

## 2023-03-21 DIAGNOSIS — I252 Old myocardial infarction: Secondary | ICD-10-CM | POA: Diagnosis not present

## 2023-03-21 DIAGNOSIS — K219 Gastro-esophageal reflux disease without esophagitis: Secondary | ICD-10-CM | POA: Diagnosis present

## 2023-03-21 DIAGNOSIS — G8929 Other chronic pain: Secondary | ICD-10-CM | POA: Diagnosis present

## 2023-03-21 DIAGNOSIS — Z7982 Long term (current) use of aspirin: Secondary | ICD-10-CM | POA: Diagnosis not present

## 2023-03-21 DIAGNOSIS — K3580 Unspecified acute appendicitis: Secondary | ICD-10-CM | POA: Diagnosis not present

## 2023-03-21 DIAGNOSIS — K353 Acute appendicitis with localized peritonitis, without perforation or gangrene: Principal | ICD-10-CM

## 2023-03-21 DIAGNOSIS — Z882 Allergy status to sulfonamides status: Secondary | ICD-10-CM | POA: Diagnosis not present

## 2023-03-21 DIAGNOSIS — K35201 Acute appendicitis with generalized peritonitis, with perforation, without abscess: Secondary | ICD-10-CM | POA: Diagnosis not present

## 2023-03-21 DIAGNOSIS — Z90710 Acquired absence of both cervix and uterus: Secondary | ICD-10-CM

## 2023-03-21 DIAGNOSIS — K8020 Calculus of gallbladder without cholecystitis without obstruction: Secondary | ICD-10-CM | POA: Diagnosis not present

## 2023-03-21 DIAGNOSIS — K760 Fatty (change of) liver, not elsewhere classified: Secondary | ICD-10-CM | POA: Diagnosis not present

## 2023-03-21 LAB — CBC
HCT: 41.4 % (ref 36.0–46.0)
Hemoglobin: 13.7 g/dL (ref 12.0–15.0)
MCH: 30.7 pg (ref 26.0–34.0)
MCHC: 33.1 g/dL (ref 30.0–36.0)
MCV: 92.8 fL (ref 80.0–100.0)
Platelets: 208 10*3/uL (ref 150–400)
RBC: 4.46 MIL/uL (ref 3.87–5.11)
RDW: 13.5 % (ref 11.5–15.5)
WBC: 7.6 10*3/uL (ref 4.0–10.5)
nRBC: 0 % (ref 0.0–0.2)

## 2023-03-21 LAB — COMPREHENSIVE METABOLIC PANEL
ALT: 38 U/L (ref 0–44)
AST: 44 U/L — ABNORMAL HIGH (ref 15–41)
Albumin: 4.2 g/dL (ref 3.5–5.0)
Alkaline Phosphatase: 65 U/L (ref 38–126)
Anion gap: 10 (ref 5–15)
BUN: 14 mg/dL (ref 8–23)
CO2: 23 mmol/L (ref 22–32)
Calcium: 9.4 mg/dL (ref 8.9–10.3)
Chloride: 101 mmol/L (ref 98–111)
Creatinine, Ser: 0.95 mg/dL (ref 0.44–1.00)
GFR, Estimated: >60 mL/min (ref >60)
Glucose, Bld: 137 mg/dL — ABNORMAL HIGH (ref 70–99)
Potassium: 3.6 mmol/L (ref 3.5–5.1)
Sodium: 134 mmol/L — ABNORMAL LOW (ref 135–145)
Total Bilirubin: 1.5 mg/dL — ABNORMAL HIGH (ref 0.3–1.2)
Total Protein: 7.1 g/dL (ref 6.5–8.1)

## 2023-03-21 LAB — URINALYSIS, ROUTINE W REFLEX MICROSCOPIC
Bilirubin Urine: NEGATIVE
Glucose, UA: NEGATIVE mg/dL
Hgb urine dipstick: NEGATIVE
Ketones, ur: NEGATIVE mg/dL
Nitrite: NEGATIVE
Protein, ur: NEGATIVE mg/dL
Specific Gravity, Urine: 1.015 (ref 1.005–1.030)
pH: 8.5 — ABNORMAL HIGH (ref 5.0–8.0)

## 2023-03-21 LAB — URINALYSIS, MICROSCOPIC (REFLEX)

## 2023-03-21 LAB — LIPASE, BLOOD: Lipase: 26 U/L (ref 11–51)

## 2023-03-21 LAB — RESP PANEL BY RT-PCR (RSV, FLU A&B, COVID)  RVPGX2
Influenza A by PCR: NEGATIVE
Influenza B by PCR: NEGATIVE
Resp Syncytial Virus by PCR: NEGATIVE
SARS Coronavirus 2 by RT PCR: NEGATIVE

## 2023-03-21 MED ORDER — METRONIDAZOLE 500 MG/100ML IV SOLN
500.0000 mg | Freq: Once | INTRAVENOUS | Status: AC
  Administered 2023-03-22: 500 mg via INTRAVENOUS
  Filled 2023-03-21: qty 100

## 2023-03-21 MED ORDER — MORPHINE SULFATE (PF) 4 MG/ML IV SOLN
4.0000 mg | Freq: Once | INTRAVENOUS | Status: AC
  Administered 2023-03-21: 4 mg via INTRAVENOUS
  Filled 2023-03-21: qty 1

## 2023-03-21 MED ORDER — IOHEXOL 300 MG/ML  SOLN
100.0000 mL | Freq: Once | INTRAMUSCULAR | Status: AC | PRN
  Administered 2023-03-22: 100 mL via INTRAVENOUS

## 2023-03-21 MED ORDER — ACETAMINOPHEN 325 MG PO TABS
ORAL_TABLET | ORAL | Status: AC
  Filled 2023-03-21: qty 2

## 2023-03-21 MED ORDER — ONDANSETRON HCL 4 MG/2ML IJ SOLN
4.0000 mg | Freq: Once | INTRAMUSCULAR | Status: AC
  Administered 2023-03-21: 4 mg via INTRAVENOUS
  Filled 2023-03-21: qty 2

## 2023-03-21 MED ORDER — SODIUM CHLORIDE 0.9 % IV SOLN
1.0000 g | Freq: Once | INTRAVENOUS | Status: AC
  Administered 2023-03-22: 1 g via INTRAVENOUS
  Filled 2023-03-21: qty 10

## 2023-03-21 MED ORDER — SODIUM CHLORIDE 0.9 % IV BOLUS
1000.0000 mL | Freq: Once | INTRAVENOUS | Status: AC
  Administered 2023-03-22: 1000 mL via INTRAVENOUS

## 2023-03-21 MED ORDER — ACETAMINOPHEN 325 MG PO TABS
650.0000 mg | ORAL_TABLET | Freq: Once | ORAL | Status: AC | PRN
  Administered 2023-03-21: 650 mg via ORAL

## 2023-03-21 MED ORDER — SODIUM CHLORIDE 0.9 % IV SOLN
1.0000 g | Freq: Once | INTRAVENOUS | Status: AC
  Administered 2023-03-21: 1 g via INTRAVENOUS
  Filled 2023-03-21: qty 10

## 2023-03-21 NOTE — ED Triage Notes (Signed)
Pt c/o right side abdominal pain & nausea that started today.

## 2023-03-21 NOTE — ED Triage Notes (Signed)
Pt denies vomiting or diarrhea

## 2023-03-22 ENCOUNTER — Emergency Department (HOSPITAL_BASED_OUTPATIENT_CLINIC_OR_DEPARTMENT_OTHER): Payer: PPO | Admitting: Anesthesiology

## 2023-03-22 ENCOUNTER — Other Ambulatory Visit: Payer: Self-pay

## 2023-03-22 ENCOUNTER — Encounter (HOSPITAL_COMMUNITY): Admission: EM | Disposition: A | Discharge status: Home / Self Care | Payer: Self-pay | Source: Home / Self Care

## 2023-03-22 ENCOUNTER — Emergency Department (HOSPITAL_COMMUNITY): Payer: PPO | Admitting: Anesthesiology

## 2023-03-22 ENCOUNTER — Encounter (HOSPITAL_COMMUNITY): Payer: Self-pay

## 2023-03-22 DIAGNOSIS — I10 Essential (primary) hypertension: Secondary | ICD-10-CM

## 2023-03-22 DIAGNOSIS — I2510 Atherosclerotic heart disease of native coronary artery without angina pectoris: Secondary | ICD-10-CM | POA: Diagnosis not present

## 2023-03-22 DIAGNOSIS — Z87442 Personal history of urinary calculi: Secondary | ICD-10-CM | POA: Diagnosis not present

## 2023-03-22 DIAGNOSIS — E7800 Pure hypercholesterolemia, unspecified: Secondary | ICD-10-CM | POA: Diagnosis not present

## 2023-03-22 DIAGNOSIS — Z79899 Other long term (current) drug therapy: Secondary | ICD-10-CM | POA: Diagnosis not present

## 2023-03-22 DIAGNOSIS — Z955 Presence of coronary angioplasty implant and graft: Secondary | ICD-10-CM | POA: Diagnosis not present

## 2023-03-22 DIAGNOSIS — K37 Unspecified appendicitis: Secondary | ICD-10-CM | POA: Diagnosis present

## 2023-03-22 DIAGNOSIS — K219 Gastro-esophageal reflux disease without esophagitis: Secondary | ICD-10-CM | POA: Diagnosis not present

## 2023-03-22 DIAGNOSIS — Z6832 Body mass index (BMI) 32.0-32.9, adult: Secondary | ICD-10-CM | POA: Diagnosis not present

## 2023-03-22 DIAGNOSIS — M545 Low back pain, unspecified: Secondary | ICD-10-CM | POA: Diagnosis not present

## 2023-03-22 DIAGNOSIS — I252 Old myocardial infarction: Secondary | ICD-10-CM

## 2023-03-22 DIAGNOSIS — Z85828 Personal history of other malignant neoplasm of skin: Secondary | ICD-10-CM | POA: Diagnosis not present

## 2023-03-22 DIAGNOSIS — Z882 Allergy status to sulfonamides status: Secondary | ICD-10-CM | POA: Diagnosis not present

## 2023-03-22 DIAGNOSIS — Z8249 Family history of ischemic heart disease and other diseases of the circulatory system: Secondary | ICD-10-CM | POA: Diagnosis not present

## 2023-03-22 DIAGNOSIS — K3532 Acute appendicitis with perforation and localized peritonitis, without abscess: Secondary | ICD-10-CM | POA: Diagnosis not present

## 2023-03-22 DIAGNOSIS — G8929 Other chronic pain: Secondary | ICD-10-CM | POA: Diagnosis not present

## 2023-03-22 DIAGNOSIS — K3533 Acute appendicitis with perforation and localized peritonitis, with abscess: Secondary | ICD-10-CM | POA: Diagnosis not present

## 2023-03-22 DIAGNOSIS — Z1152 Encounter for screening for COVID-19: Secondary | ICD-10-CM | POA: Diagnosis not present

## 2023-03-22 DIAGNOSIS — E669 Obesity, unspecified: Secondary | ICD-10-CM | POA: Diagnosis not present

## 2023-03-22 DIAGNOSIS — Z90710 Acquired absence of both cervix and uterus: Secondary | ICD-10-CM | POA: Diagnosis not present

## 2023-03-22 DIAGNOSIS — Z888 Allergy status to other drugs, medicaments and biological substances status: Secondary | ICD-10-CM | POA: Diagnosis not present

## 2023-03-22 DIAGNOSIS — Z7982 Long term (current) use of aspirin: Secondary | ICD-10-CM | POA: Diagnosis not present

## 2023-03-22 DIAGNOSIS — Z96651 Presence of right artificial knee joint: Secondary | ICD-10-CM | POA: Diagnosis not present

## 2023-03-22 HISTORY — PX: LAPAROSCOPIC APPENDECTOMY: SHX408

## 2023-03-22 LAB — CREATININE, SERUM
Creatinine, Ser: 1.02 mg/dL — ABNORMAL HIGH (ref 0.44–1.00)
GFR, Estimated: 59 mL/min — ABNORMAL LOW (ref >60)

## 2023-03-22 LAB — CBC
HCT: 37.3 % (ref 36.0–46.0)
Hemoglobin: 11.8 g/dL — ABNORMAL LOW (ref 12.0–15.0)
MCH: 30.6 pg (ref 26.0–34.0)
MCHC: 31.6 g/dL (ref 30.0–36.0)
MCV: 96.9 fL (ref 80.0–100.0)
Platelets: 175 10*3/uL (ref 150–400)
RBC: 3.85 MIL/uL — ABNORMAL LOW (ref 3.87–5.11)
RDW: 14 % (ref 11.5–15.5)
WBC: 6.7 10*3/uL (ref 4.0–10.5)
nRBC: 0 % (ref 0.0–0.2)

## 2023-03-22 LAB — HIV ANTIBODY (ROUTINE TESTING W REFLEX): HIV Screen 4th Generation wRfx: NONREACTIVE

## 2023-03-22 SURGERY — APPENDECTOMY, LAPAROSCOPIC
Anesthesia: General | Site: Abdomen

## 2023-03-22 MED ORDER — SUGAMMADEX SODIUM 200 MG/2ML IV SOLN
INTRAVENOUS | Status: DC | PRN
  Administered 2023-03-22: 200 mg via INTRAVENOUS
  Administered 2023-03-22: 100 mg via INTRAVENOUS

## 2023-03-22 MED ORDER — ROCURONIUM BROMIDE 10 MG/ML (PF) SYRINGE
PREFILLED_SYRINGE | INTRAVENOUS | Status: AC
  Filled 2023-03-22: qty 10

## 2023-03-22 MED ORDER — METOPROLOL SUCCINATE ER 25 MG PO TB24
25.0000 mg | ORAL_TABLET | Freq: Every day | ORAL | Status: DC
  Administered 2023-03-24 – 2023-03-25 (×2): 25 mg via ORAL
  Filled 2023-03-22 (×3): qty 1

## 2023-03-22 MED ORDER — MELATONIN 3 MG PO TABS: 3.0000 mg | ORAL_TABLET | Freq: Every evening | ORAL | Status: DC | PRN

## 2023-03-22 MED ORDER — DIPHENHYDRAMINE HCL 25 MG PO CAPS: 25.0000 mg | ORAL_CAPSULE | Freq: Four times a day (QID) | ORAL | Status: DC | PRN

## 2023-03-22 MED ORDER — PANTOPRAZOLE SODIUM 40 MG PO TBEC
40.0000 mg | DELAYED_RELEASE_TABLET | Freq: Every day | ORAL | Status: DC
  Administered 2023-03-22 – 2023-03-25 (×4): 40 mg via ORAL
  Filled 2023-03-22 (×4): qty 1

## 2023-03-22 MED ORDER — LACTATED RINGERS IV SOLN: INTRAVENOUS | Status: DC

## 2023-03-22 MED ORDER — HYDROCHLOROTHIAZIDE 12.5 MG PO TABS
12.5000 mg | ORAL_TABLET | Freq: Every day | ORAL | Status: DC
  Administered 2023-03-24 – 2023-03-25 (×2): 12.5 mg via ORAL
  Filled 2023-03-22 (×3): qty 1

## 2023-03-22 MED ORDER — OXYCODONE HCL 5 MG PO TABS
5.0000 mg | ORAL_TABLET | ORAL | Status: DC | PRN
  Administered 2023-03-24 (×2): 10 mg via ORAL
  Filled 2023-03-22 (×2): qty 2

## 2023-03-22 MED ORDER — POTASSIUM CHLORIDE IN NACL 20-0.9 MEQ/L-% IV SOLN
INTRAVENOUS | Status: DC
  Filled 2023-03-22: qty 1000

## 2023-03-22 MED ORDER — FENTANYL CITRATE (PF) 100 MCG/2ML IJ SOLN: 25.0000 ug | INTRAMUSCULAR | Status: DC | PRN

## 2023-03-22 MED ORDER — ONDANSETRON HCL 4 MG/2ML IJ SOLN: 4.0000 mg | Freq: Once | INTRAMUSCULAR | Status: DC | PRN

## 2023-03-22 MED ORDER — DOCUSATE SODIUM 100 MG PO CAPS
100.0000 mg | ORAL_CAPSULE | Freq: Two times a day (BID) | ORAL | Status: DC
  Administered 2023-03-22 – 2023-03-23 (×2): 100 mg via ORAL
  Filled 2023-03-22 (×5): qty 1

## 2023-03-22 MED ORDER — PIPERACILLIN-TAZOBACTAM 3.375 G IVPB
3.3750 g | Freq: Three times a day (TID) | INTRAVENOUS | Status: DC
  Administered 2023-03-22 – 2023-03-25 (×9): 3.375 g via INTRAVENOUS
  Filled 2023-03-22 (×9): qty 50

## 2023-03-22 MED ORDER — ONDANSETRON 4 MG PO TBDP: 4.0000 mg | ORAL_TABLET | Freq: Four times a day (QID) | ORAL | Status: DC | PRN

## 2023-03-22 MED ORDER — HYDROMORPHONE HCL 1 MG/ML IJ SOLN: 0.5000 mg | INTRAMUSCULAR | Status: DC | PRN

## 2023-03-22 MED ORDER — BUPIVACAINE HCL 0.25 % IJ SOLN
INTRAMUSCULAR | Status: DC | PRN
  Administered 2023-03-22: 20 mL

## 2023-03-22 MED ORDER — LIDOCAINE 2% (20 MG/ML) 5 ML SYRINGE
INTRAMUSCULAR | Status: DC | PRN
  Administered 2023-03-22: 100 mg via INTRAVENOUS

## 2023-03-22 MED ORDER — LIDOCAINE 2% (20 MG/ML) 5 ML SYRINGE
INTRAMUSCULAR | Status: AC
  Filled 2023-03-22: qty 5

## 2023-03-22 MED ORDER — IRBESARTAN 150 MG PO TABS
150.0000 mg | ORAL_TABLET | Freq: Every day | ORAL | Status: DC
  Administered 2023-03-24 – 2023-03-25 (×2): 150 mg via ORAL
  Filled 2023-03-22 (×3): qty 1

## 2023-03-22 MED ORDER — ONDANSETRON HCL 4 MG/2ML IJ SOLN
INTRAMUSCULAR | Status: AC
  Filled 2023-03-22: qty 2

## 2023-03-22 MED ORDER — 0.9 % SODIUM CHLORIDE (POUR BTL) OPTIME
TOPICAL | Status: DC | PRN
  Administered 2023-03-22: 1000 mL

## 2023-03-22 MED ORDER — IRBESARTAN-HYDROCHLOROTHIAZIDE 150-12.5 MG PO TABS: 1.0000 | ORAL_TABLET | Freq: Every day | ORAL | Status: DC

## 2023-03-22 MED ORDER — ACETAMINOPHEN 500 MG PO TABS
1000.0000 mg | ORAL_TABLET | Freq: Four times a day (QID) | ORAL | Status: DC
  Administered 2023-03-22 – 2023-03-24 (×7): 1000 mg via ORAL
  Filled 2023-03-22 (×8): qty 2

## 2023-03-22 MED ORDER — ROCURONIUM BROMIDE 10 MG/ML (PF) SYRINGE
PREFILLED_SYRINGE | INTRAVENOUS | Status: DC | PRN
  Administered 2023-03-22: 60 mg via INTRAVENOUS

## 2023-03-22 MED ORDER — SIMETHICONE 80 MG PO CHEW
40.0000 mg | CHEWABLE_TABLET | Freq: Four times a day (QID) | ORAL | Status: DC | PRN
  Administered 2023-03-22 – 2023-03-23 (×2): 40 mg via ORAL
  Filled 2023-03-22 (×2): qty 1

## 2023-03-22 MED ORDER — OXYCODONE HCL 5 MG PO TABS: 5.0000 mg | ORAL_TABLET | Freq: Once | ORAL | Status: DC | PRN

## 2023-03-22 MED ORDER — FENTANYL CITRATE (PF) 250 MCG/5ML IJ SOLN
INTRAMUSCULAR | Status: AC
  Filled 2023-03-22: qty 5

## 2023-03-22 MED ORDER — FENTANYL CITRATE (PF) 250 MCG/5ML IJ SOLN
INTRAMUSCULAR | Status: DC | PRN
  Administered 2023-03-22: 50 ug via INTRAVENOUS

## 2023-03-22 MED ORDER — ONDANSETRON HCL 4 MG/2ML IJ SOLN: 4.0000 mg | Freq: Four times a day (QID) | INTRAMUSCULAR | Status: DC | PRN

## 2023-03-22 MED ORDER — CHLORHEXIDINE GLUCONATE 0.12 % MT SOLN
15.0000 mL | OROMUCOSAL | Status: AC
  Administered 2023-03-22: 15 mL via OROMUCOSAL

## 2023-03-22 MED ORDER — ROSUVASTATIN CALCIUM 20 MG PO TABS
20.0000 mg | ORAL_TABLET | Freq: Every day | ORAL | Status: DC
  Administered 2023-03-23 – 2023-03-25 (×3): 20 mg via ORAL
  Filled 2023-03-22 (×3): qty 1

## 2023-03-22 MED ORDER — PROPOFOL 10 MG/ML IV BOLUS
INTRAVENOUS | Status: AC
  Filled 2023-03-22: qty 20

## 2023-03-22 MED ORDER — PROPOFOL 10 MG/ML IV BOLUS
INTRAVENOUS | Status: DC | PRN
  Administered 2023-03-22: 150 mg via INTRAVENOUS

## 2023-03-22 MED ORDER — GLUCOSAMINE SULFATE 500 MG PO TABS: 1.0000 | ORAL_TABLET | Freq: Every day | ORAL | Status: DC

## 2023-03-22 MED ORDER — ENOXAPARIN SODIUM 40 MG/0.4ML IJ SOSY
40.0000 mg | PREFILLED_SYRINGE | INTRAMUSCULAR | Status: DC
  Administered 2023-03-23 – 2023-03-25 (×3): 40 mg via SUBCUTANEOUS
  Filled 2023-03-22 (×3): qty 0.4

## 2023-03-22 MED ORDER — DIPHENHYDRAMINE HCL 50 MG/ML IJ SOLN: 25.0000 mg | Freq: Four times a day (QID) | INTRAMUSCULAR | Status: DC | PRN

## 2023-03-22 MED ORDER — METOPROLOL TARTRATE 5 MG/5ML IV SOLN: 5.0000 mg | Freq: Four times a day (QID) | INTRAVENOUS | Status: DC | PRN

## 2023-03-22 MED ORDER — OXYCODONE HCL 5 MG/5ML PO SOLN: 5.0000 mg | Freq: Once | ORAL | Status: DC | PRN

## 2023-03-22 MED ORDER — ONDANSETRON HCL 4 MG/2ML IJ SOLN
INTRAMUSCULAR | Status: DC | PRN
  Administered 2023-03-22: 4 mg via INTRAVENOUS

## 2023-03-22 MED ORDER — SODIUM CHLORIDE 0.9 % IR SOLN
Status: DC | PRN
  Administered 2023-03-22: 1000 mL

## 2023-03-22 MED ORDER — DEXAMETHASONE SODIUM PHOSPHATE 10 MG/ML IJ SOLN
INTRAMUSCULAR | Status: DC | PRN
  Administered 2023-03-22: 4 mg via INTRAVENOUS

## 2023-03-22 MED ORDER — DULOXETINE HCL 30 MG PO CPEP
30.0000 mg | ORAL_CAPSULE | Freq: Every day | ORAL | Status: DC
  Administered 2023-03-22 – 2023-03-25 (×4): 30 mg via ORAL
  Filled 2023-03-22 (×4): qty 1

## 2023-03-22 MED ORDER — FENTANYL CITRATE PF 50 MCG/ML IJ SOSY
12.5000 ug | PREFILLED_SYRINGE | Freq: Once | INTRAMUSCULAR | Status: AC
  Administered 2023-03-22: 12.5 ug via INTRAVENOUS
  Filled 2023-03-22: qty 1

## 2023-03-22 MED ORDER — TIZANIDINE HCL 2 MG PO TABS: 2.0000 mg | ORAL_TABLET | Freq: Four times a day (QID) | ORAL | Status: DC | PRN

## 2023-03-22 MED ORDER — KETOROLAC TROMETHAMINE 30 MG/ML IJ SOLN: 30.0000 mg | Freq: Once | INTRAMUSCULAR | Status: DC | PRN

## 2023-03-22 MED ORDER — MORPHINE SULFATE (PF) 4 MG/ML IV SOLN
4.0000 mg | Freq: Once | INTRAVENOUS | Status: AC
  Administered 2023-03-22: 4 mg via INTRAVENOUS
  Filled 2023-03-22: qty 1

## 2023-03-22 SURGICAL SUPPLY — 35 items
ADH SKN CLS APL DERMABOND .7 (GAUZE/BANDAGES/DRESSINGS) ×1
APL PRP STRL LF DISP 70% ISPRP (MISCELLANEOUS) ×1
BAG COUNTER SPONGE SURGICOUNT (BAG) ×2 IMPLANT
BAG SPNG CNTER NS LX DISP (BAG) ×1
CANISTER SUCT 3000ML PPV (MISCELLANEOUS) ×2 IMPLANT
CHLORAPREP W/TINT 26 (MISCELLANEOUS) ×2 IMPLANT
COVER SURGICAL LIGHT HANDLE (MISCELLANEOUS) ×2 IMPLANT
DERMABOND ADVANCED .7 DNX12 (GAUZE/BANDAGES/DRESSINGS) ×2 IMPLANT
ELECT REM PT RETURN 9FT ADLT (ELECTROSURGICAL) ×1
ELECTRODE REM PT RTRN 9FT ADLT (ELECTROSURGICAL) ×2 IMPLANT
GLOVE SURG SIGNA 7.5 PF LTX (GLOVE) ×2 IMPLANT
GOWN STRL REUS W/ TWL LRG LVL3 (GOWN DISPOSABLE) ×4 IMPLANT
GOWN STRL REUS W/ TWL XL LVL3 (GOWN DISPOSABLE) ×2 IMPLANT
GOWN STRL REUS W/TWL LRG LVL3 (GOWN DISPOSABLE) ×2
GOWN STRL REUS W/TWL XL LVL3 (GOWN DISPOSABLE) ×1
IRRIG SUCT STRYKERFLOW 2 WTIP (MISCELLANEOUS) ×1
IRRIGATION SUCT STRKRFLW 2 WTP (MISCELLANEOUS) ×2 IMPLANT
KIT BASIN OR (CUSTOM PROCEDURE TRAY) ×2 IMPLANT
KIT TURNOVER KIT B (KITS) ×2 IMPLANT
NS IRRIG 1000ML POUR BTL (IV SOLUTION) ×2 IMPLANT
PAD ARMBOARD 7.5X6 YLW CONV (MISCELLANEOUS) ×4 IMPLANT
SET TUBE SMOKE EVAC HIGH FLOW (TUBING) ×2 IMPLANT
SHEARS HARMONIC ACE PLUS 36CM (ENDOMECHANICALS) ×2 IMPLANT
SLEEVE Z-THREAD 5X100MM (TROCAR) ×2 IMPLANT
SPECIMEN JAR SMALL (MISCELLANEOUS) ×2 IMPLANT
SUT MON AB 4-0 PC3 18 (SUTURE) ×2 IMPLANT
SYS BAG RETRIEVAL 10MM (BASKET) ×1
SYSTEM BAG RETRIEVAL 10MM (BASKET) ×2 IMPLANT
TOWEL GREEN STERILE (TOWEL DISPOSABLE) ×2 IMPLANT
TOWEL GREEN STERILE FF (TOWEL DISPOSABLE) ×2 IMPLANT
TRAY LAPAROSCOPIC MC (CUSTOM PROCEDURE TRAY) ×2 IMPLANT
TROCAR BALLN 12MMX100 BLUNT (TROCAR) ×2 IMPLANT
TROCAR Z-THREAD OPTICAL 5X100M (TROCAR) ×2 IMPLANT
WARMER LAPAROSCOPE (MISCELLANEOUS) ×2 IMPLANT
WATER STERILE IRR 1000ML POUR (IV SOLUTION) ×2 IMPLANT

## 2023-03-22 NOTE — ED Notes (Signed)
Patient & family updated as to patient's status. Transport notified, patient bed assigned to [phone]

## 2023-03-22 NOTE — ED Notes (Signed)
Attempted report x 2 

## 2023-03-22 NOTE — Progress Notes (Signed)
PHARMACIST - PHYSICIAN ORDER COMMUNICATION  CONCERNING: P&T Medication Policy on Herbal Medications  DESCRIPTION:  This patient's order for:  glucosamine  has been noted.  This product(s) is classified as an "herbal" or natural product. Due to a lack of definitive safety studies or FDA approval, nonstandard manufacturing practices, plus the potential risk of unknown drug-drug interactions while on inpatient medications, the Pharmacy and Therapeutics Committee does not permit the use of "herbal" or natural products of this type within [facility].   ACTION TAKEN: The pharmacy department is unable to verify this order at this time and your patient has been informed of this safety policy. Please reevaluate patient's clinical condition at discharge and address if the herbal or natural product(s) should be resumed at that time.  Rexford Maus, PharmD, BCPS 03/22/2023 3:15 PM

## 2023-03-22 NOTE — ED Notes (Signed)
Per Particia Jasper at patient placement--this bed was cancelled because 6N does not have enough nurses to care for patients--Clinton Dragone

## 2023-03-22 NOTE — Transfer of Care (Signed)
Immediate Anesthesia Transfer of Care Note  Patient: [name] [name]  Procedure(s) Performed: APPENDECTOMY LAPAROSCOPIC (Abdomen)  Patient Location: PACU  Anesthesia Type:General  Level of Consciousness: awake  Airway & Oxygen Therapy: Patient Spontanous Breathing and Patient connected to nasal cannula oxygen  Post-op Assessment: Report given to RN and Post -op Vital signs reviewed and stable  Post vital signs: Reviewed and stable  Last Vitals:  Vitals Value Taken Time  BP 109/52 03/22/23 1155  Temp    Pulse 72 03/22/23 1158  Resp 23 03/22/23 1158  SpO2 95 % 03/22/23 1158  Vitals shown include unvalidated device data.  Last Pain:  Vitals:   03/22/23 0827  TempSrc:   PainSc: 6          Complications: No notable events documented.

## 2023-03-22 NOTE — Discharge Instructions (Signed)

## 2023-03-22 NOTE — ED Notes (Signed)
Night shift nurse called for report, unsuccessful.

## 2023-03-22 NOTE — Progress Notes (Signed)
Patient arrived to 6 north room 12 alert and oriented x4. Pain level 4/10. 3 lap sites with skin glue all clean, dry and intact. 2L o2 attached. Bed in lowest position. Call light in reach. Will continue to monitor pt
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
## 2023-03-22 NOTE — Plan of Care (Signed)
  Problem: Education: Goal: Knowledge of General Education information will improve Description: Including pain rating scale, medication(s)/side effects and non-pharmacologic comfort measures Outcome: Progressing   Problem: Activity: Goal: Risk for activity intolerance will decrease Outcome: Progressing   Problem: Nutrition: Goal: Adequate nutrition will be maintained Outcome: Progressing   Problem: Coping: Goal: Level of anxiety will decrease Outcome: Progressing   Problem: Elimination: Goal: Will not experience complications related to bowel motility Outcome: Progressing Goal: Will not experience complications related to urinary retention Outcome: Progressing   Problem: Pain Managment: Goal: General experience of comfort will improve Outcome: Progressing   Problem: Skin Integrity: Goal: Risk for impaired skin integrity will decrease Outcome: Progressing   Problem: Safety: Goal: Ability to remain free from injury will improve Outcome: Progressing

## 2023-03-22 NOTE — Anesthesia Procedure Notes (Addendum)
Procedure Name: Intubation Date/Time: 03/22/2023 11:15 AM  Performed by: Cy Blamer, CRNAPre-anesthesia Checklist: Patient identified, Emergency Drugs available, Suction available and Patient being monitored Patient Re-evaluated:Patient Re-evaluated prior to induction Oxygen Delivery Method: Circle system utilized Preoxygenation: Pre-oxygenation with 100% oxygen Induction Type: IV induction Ventilation: Mask ventilation without difficulty Laryngoscope Size: Glidescope and 4 Grade View: Grade I Tube type: Oral Tube size: 7.0 mm Number of attempts: 2 Airway Equipment and Method: Video-laryngoscopy, Stylet and Bite block Placement Confirmation: ETT inserted through vocal cords under direct vision, positive ETCO2 and breath sounds checked- equal and bilateral Secured at: 21 cm Tube secured with: Tape Dental Injury: Teeth and Oropharynx as per pre-operative assessment  Comments: 1st attempt Grade III view w/Miller 2 blade, No Glidescope 3 handles available, recommend size 3 handle for future intubations

## 2023-03-22 NOTE — Op Note (Signed)
APPENDECTOMY LAPAROSCOPIC  Procedure Note  [name] [name] 03/22/2023   Pre-op Diagnosis: acute appendicitis     Post-op Diagnosis: perforated appendicitis  Procedure(s): APPENDECTOMY LAPAROSCOPIC  Surgeon(s): Abigail Miyamoto, MD  Anesthesia: General  Staff:  Circulator: Virgel Bouquet, RN Scrub Person: Laddie Aquas H  Estimated Blood Loss: Minimal               Specimens: sent to path  Indications: This is a 72 year old female presented with right lower quadrant abdominal pain that started yesterday.  She underwent a CT scan of the abdomen pelvis overnight showing findings consistent with acute appendicitis.  She was transferred to [facility] with plans for laparoscopic appendectomy  Findings: Patient was found to have a contracted, retrocecal appendix with perforation at the tip and several free small stool balls in the right lower quadrant.  There was a mild amount of fibrinous exudate as well  Procedure: The patient was brought to the op room identified as a correct patient.  She was placed upon the operating table and general anesthesia was induced.  Her abdomen was then prepped and draped in the usual sterile fashion.  I made a small vertical incision below the umbilicus with a scalpel.  I then carried this down the fascia was opened with a scalpel as well.  A Kelly clamp was then used to gain entrance into the peritoneal cavity under direct vision.  A 0 Vicryl pursestring suture was placed around the fascial opening.  The [facility] port was placed at the opening and insufflation of the abdomen was begun.  I placed a 5 mm trocar in the patient's right upper quadrant and another in the left lower quadrant both under direct vision.  I then identified the cecum.  There was mild to moderate fibrinous exudate in the right lower quadrant.  I bluntly freed up the small bowel from this area and identified the appendix which was foreshortened and turning retrocecal.   There was perforation at the tip of the appendix with several stool balls free in the right lower quadrant.  I transected the mesoappendix with the harmonic scalpel.  I then transected the base of the appendix with a laparoscopic GIA stapler.  I then placed the appendix in the stool balls into an Endosac and removed this to the incision at the umbilicus.  I then copiously irrigated the right lower quadrant with normal saline.  Hemostasis appeared to be achieved.  I tried to evaluate the cyst in the left adnexa but could not visualize this area.  At this point the decision made to terminate the procedure.  All ports were removed under direct vision and the abdomen was deflated.  A 0 Vicryl at the umbilicus was tied in place closing the fascial defect.  All incisions were then anesthetized Marcaine and closed with 4-0 Monocryl sutures.  Dermabond was then applied.  The patient tolerated the procedure well.  All the counts were correct at the end of the procedure.  Patient was then extubated in the operating room and taken in stable addition to the recovery room.          Abigail Miyamoto   Date: 03/22/2023  Time: 11:44 AM
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
## 2023-03-22 NOTE — Anesthesia Preprocedure Evaluation (Addendum)
Anesthesia Evaluation  Patient identified by MRN, date of birth, ID band Patient awake    Reviewed: Allergy & Precautions, H&P , NPO status , Patient's Chart, lab work & pertinent test results  Airway Mallampati: III  TM Distance: <3 FB Neck ROM: Full    Dental no notable dental hx.    Pulmonary neg pulmonary ROS   Pulmonary exam normal breath sounds clear to auscultation       Cardiovascular hypertension, + CAD, + Past MI and + Cardiac Stents  Normal cardiovascular exam Rhythm:Regular Rate:Normal     Neuro/Psych negative neurological ROS  negative psych ROS   GI/Hepatic Neg liver ROS,GERD  ,,  Endo/Other  negative endocrine ROS    Renal/GU negative Renal ROS  negative genitourinary   Musculoskeletal negative musculoskeletal ROS (+)    Abdominal   Peds negative pediatric ROS (+)  Hematology negative hematology ROS (+)   Anesthesia Other Findings   Reproductive/Obstetrics negative OB ROS                             Anesthesia Physical Anesthesia Plan  ASA: 3  Anesthesia Plan: General   Post-op Pain Management: Minimal or no pain anticipated   Induction: Intravenous  PONV Risk Score and Plan: 3 and Ondansetron, Dexamethasone and Treatment may vary due to age or medical condition  Airway Management Planned: Oral ETT  Additional Equipment:   Intra-op Plan:   Post-operative Plan: Extubation in OR  Informed Consent: I have reviewed the patients History and Physical, chart, labs and discussed the procedure including the risks, benefits and alternatives for the proposed anesthesia with the patient or authorized representative who has indicated his/her understanding and acceptance.     Dental advisory given  Plan Discussed with: CRNA and Surgeon  Anesthesia Plan Comments:        Anesthesia Quick Evaluation

## 2023-03-22 NOTE — Anesthesia Postprocedure Evaluation (Signed)
Anesthesia Post Note  Patient: [name] [name]  Procedure(s) Performed: APPENDECTOMY LAPAROSCOPIC (Abdomen)     Patient location during evaluation: PACU Anesthesia Type: General Level of consciousness: awake and alert Pain management: pain level controlled Vital Signs Assessment: post-procedure vital signs reviewed and stable Respiratory status: spontaneous breathing, nonlabored ventilation, respiratory function stable and patient connected to nasal cannula oxygen Cardiovascular status: blood pressure returned to baseline and stable Postop Assessment: no apparent nausea or vomiting Anesthetic complications: no  No notable events documented.  Last Vitals:  Vitals:   03/22/23 1230 03/22/23 1251  BP: (!) 110/51 (!) 105/52  Pulse: 75 70  Resp: (!) 25 17  Temp: 37.7 C 37.9 C  SpO2: 94% 95%    Last Pain:  Vitals:   03/22/23 1251  TempSrc: Oral  PainSc:                  Nichola Warren S

## 2023-03-22 NOTE — ED Notes (Signed)
Attempted report x1. No one answered at [phone]

## 2023-03-23 ENCOUNTER — Encounter (HOSPITAL_COMMUNITY): Payer: Self-pay | Admitting: Surgery

## 2023-03-23 DIAGNOSIS — K3533 Acute appendicitis with perforation and localized peritonitis, with abscess: Secondary | ICD-10-CM | POA: Diagnosis present

## 2023-03-23 DIAGNOSIS — Z6832 Body mass index (BMI) 32.0-32.9, adult: Secondary | ICD-10-CM | POA: Diagnosis not present

## 2023-03-23 DIAGNOSIS — Z8249 Family history of ischemic heart disease and other diseases of the circulatory system: Secondary | ICD-10-CM | POA: Diagnosis not present

## 2023-03-23 DIAGNOSIS — E7800 Pure hypercholesterolemia, unspecified: Secondary | ICD-10-CM | POA: Diagnosis present

## 2023-03-23 DIAGNOSIS — Z888 Allergy status to other drugs, medicaments and biological substances status: Secondary | ICD-10-CM | POA: Diagnosis not present

## 2023-03-23 DIAGNOSIS — Z90710 Acquired absence of both cervix and uterus: Secondary | ICD-10-CM | POA: Diagnosis not present

## 2023-03-23 DIAGNOSIS — Z882 Allergy status to sulfonamides status: Secondary | ICD-10-CM | POA: Diagnosis not present

## 2023-03-23 DIAGNOSIS — Z7982 Long term (current) use of aspirin: Secondary | ICD-10-CM | POA: Diagnosis not present

## 2023-03-23 DIAGNOSIS — Z87442 Personal history of urinary calculi: Secondary | ICD-10-CM | POA: Diagnosis not present

## 2023-03-23 DIAGNOSIS — Z955 Presence of coronary angioplasty implant and graft: Secondary | ICD-10-CM | POA: Diagnosis not present

## 2023-03-23 DIAGNOSIS — G8929 Other chronic pain: Secondary | ICD-10-CM | POA: Diagnosis present

## 2023-03-23 DIAGNOSIS — Z79899 Other long term (current) drug therapy: Secondary | ICD-10-CM | POA: Diagnosis not present

## 2023-03-23 DIAGNOSIS — E669 Obesity, unspecified: Secondary | ICD-10-CM | POA: Diagnosis present

## 2023-03-23 DIAGNOSIS — I10 Essential (primary) hypertension: Secondary | ICD-10-CM | POA: Diagnosis present

## 2023-03-23 DIAGNOSIS — Z85828 Personal history of other malignant neoplasm of skin: Secondary | ICD-10-CM | POA: Diagnosis not present

## 2023-03-23 DIAGNOSIS — K219 Gastro-esophageal reflux disease without esophagitis: Secondary | ICD-10-CM | POA: Diagnosis present

## 2023-03-23 DIAGNOSIS — Z96651 Presence of right artificial knee joint: Secondary | ICD-10-CM | POA: Diagnosis present

## 2023-03-23 DIAGNOSIS — K37 Unspecified appendicitis: Secondary | ICD-10-CM | POA: Diagnosis present

## 2023-03-23 DIAGNOSIS — M545 Low back pain, unspecified: Secondary | ICD-10-CM | POA: Diagnosis present

## 2023-03-23 DIAGNOSIS — I252 Old myocardial infarction: Secondary | ICD-10-CM | POA: Diagnosis not present

## 2023-03-23 DIAGNOSIS — I2510 Atherosclerotic heart disease of native coronary artery without angina pectoris: Secondary | ICD-10-CM | POA: Diagnosis present

## 2023-03-23 DIAGNOSIS — K3532 Acute appendicitis with perforation and localized peritonitis, without abscess: Secondary | ICD-10-CM | POA: Diagnosis present

## 2023-03-23 DIAGNOSIS — Z1152 Encounter for screening for COVID-19: Secondary | ICD-10-CM | POA: Diagnosis not present

## 2023-03-23 LAB — CBC
HCT: 33.9 % — ABNORMAL LOW (ref 36.0–46.0)
Hemoglobin: 11 g/dL — ABNORMAL LOW (ref 12.0–15.0)
MCH: 30.8 pg (ref 26.0–34.0)
MCHC: 32.4 g/dL (ref 30.0–36.0)
MCV: 95 fL (ref 80.0–100.0)
Platelets: 171 10*3/uL (ref 150–400)
RBC: 3.57 MIL/uL — ABNORMAL LOW (ref 3.87–5.11)
RDW: 14 % (ref 11.5–15.5)
WBC: 6.3 10*3/uL (ref 4.0–10.5)
nRBC: 0 % (ref 0.0–0.2)

## 2023-03-23 LAB — BASIC METABOLIC PANEL
Anion gap: 11 (ref 5–15)
BUN: 14 mg/dL (ref 8–23)
CO2: 24 mmol/L (ref 22–32)
Calcium: 8.7 mg/dL — ABNORMAL LOW (ref 8.9–10.3)
Chloride: 104 mmol/L (ref 98–111)
Creatinine, Ser: 1.07 mg/dL — ABNORMAL HIGH (ref 0.44–1.00)
GFR, Estimated: 56 mL/min — ABNORMAL LOW (ref >60)
Glucose, Bld: 125 mg/dL — ABNORMAL HIGH (ref 70–99)
Potassium: 3.7 mmol/L (ref 3.5–5.1)
Sodium: 139 mmol/L (ref 135–145)

## 2023-03-23 NOTE — Progress Notes (Signed)
Progress Note  1 Day Post-Op  Subjective: Pt reports pain mostly from gas, cramping. She is passing some flatus and that has helped some. Denies nausea but not very hungry. Taking mostly water this AM. Husband at bedside.   Objective: Vital signs in last 24 hours: Temp:  [99 F (37.2 C)-100.2 F (37.9 C)] 99 F (37.2 C) (04/18 0603) Pulse Rate:  [59-75] 59 (04/18 0603) Resp:  [16-25] 16 (04/17 1955) BP: (88-111)/(36-56) 102/56 (04/18 0603) SpO2:  [93 %-98 %] 98 % (04/18 0603)    Intake/Output from previous day: 04/17 0701 - 04/18 0700 In: 1834.3 [P.O.:120; I.V.:1617.6; IV Piggyback:96.7] Out: -  Intake/Output this shift: No intake/output data recorded.  PE: General: pleasant, WD, obese female who is laying in bed in NAD Heart: regular, rate, and rhythm.   Lungs: Respiratory effort nonlabored Abd: soft, appropriately ttp, mild distention, incisions C/D/I, +BS Psych: A&Ox3 with an appropriate affect.    Lab Results:  Recent Labs    03/22/23 1700 03/23/23 0124  WBC 6.7 6.3  HGB 11.8* 11.0*  HCT 37.3 33.9*  PLT 175 171   BMET Recent Labs    03/21/23 2206 03/22/23 1700 03/23/23 0124  NA 134*  --  139  K 3.6  --  3.7  CL 101  --  104  CO2 23  --  24  GLUCOSE 137*  --  125*  BUN 14  --  14  CREATININE 0.95 1.02* 1.07*  CALCIUM 9.4  --  8.7*   PT/INR No results for input(s): "LABPROT", "INR" in the last 72 hours. CMP     Component Value Date/Time   NA 139 03/23/2023 0124   K 3.7 03/23/2023 0124   CL 104 03/23/2023 0124   CO2 24 03/23/2023 0124   GLUCOSE 125 (H) 03/23/2023 0124   BUN 14 03/23/2023 0124   CREATININE 1.07 (H) 03/23/2023 0124   CALCIUM 8.7 (L) 03/23/2023 0124   PROT 7.1 03/21/2023 2206   ALBUMIN 4.2 03/21/2023 2206   AST 44 (H) 03/21/2023 2206   ALT 38 03/21/2023 2206   ALKPHOS 65 03/21/2023 2206   BILITOT 1.5 (H) 03/21/2023 2206   GFRNONAA 56 (L) 03/23/2023 0124   GFRAA >60 03/12/2018 1331   Lipase     Component Value  Date/Time   LIPASE 26 03/21/2023 2206       Studies/Results: CT ABDOMEN PELVIS W CONTRAST  Result Date: 03/22/2023 CLINICAL DATA:  72 year old female with right-sided abdominal pain and nausea. EXAM: CT ABDOMEN AND PELVIS WITH CONTRAST TECHNIQUE: Multidetector CT imaging of the abdomen and pelvis was performed using the standard protocol following bolus administration of intravenous contrast. RADIATION DOSE REDUCTION: This exam was performed according to the departmental dose-optimization program which includes automated exposure control, adjustment of the mA and/or kV according to patient size and/or use of iterative reconstruction technique. CONTRAST:  OMNIPAQUE IOHEXOL 300 MG/ML  SOLN COMPARISON:  CT urogram 12/11/2017 is field no acute abnormality. FINDINGS: Lower chest: No acute abnormality. Hepatobiliary: Hepatic steatosis. Cholelithiasis. No evidence of cholecystitis. No biliary dilation. Pancreas: Unremarkable. Spleen: Unremarkable. Adrenals/Urinary Tract: Normal adrenal glands. Cortical renal scarring in the lower pole left kidney. Multiples small cystic lesions too small to definitively characterize in both kidneys. No follow-up is recommended. Nonobstructing nephrolithiasis measuring up to 8 mm in the lower pole of the left kidney. No obstructing urinary calculi or hydronephrosis. Unremarkable bladder. Stomach/Bowel: Stomach is within normal limits. Normal caliber large and small bowel. Colonic diverticulosis without diverticulitis. Wall thickening about  the appendix which measures 9 mm in diameter. There is adjacent fat stranding and small amount of free fluid about the appendiceal tip. Appendicolith in the mid appendix. No abscess. No evidence of perforation. Vascular/Lymphatic: Mild aortic atherosclerosis. No lymphadenopathy. Reproductive: Hysterectomy. Left adnexal cyst measuring 3.6 cm. No right adnexal mass. Other: No abdominal wall hernia. Musculoskeletal: No acute osseous  abnormality. IMPRESSION: 1. Acute appendicitis. No evidence of perforation or abscess. 2. Hepatic steatosis. 3. Cholelithiasis. 4. Nonobstructing left nephrolithiasis. 5. Left adnexal cyst measuring 3.6 cm. Recommend follow-up US in 6-12 months. Note: This recommendation does not apply to premenarchal patients and to those with increased risk (genetic, family history, elevated tumor markers or other high-risk factors) of ovarian cancer. Reference: JACR 2020 Feb; 17(2):248-254 Aortic Atherosclerosis (ICD10-I70.0). Electronically Signed   By: Minerva Fester M.D.   On: 03/22/2023 00:35    Anti-infectives: Anti-infectives (From admission, onward)    Start     Dose/Rate Route Frequency Ordered Stop   03/22/23 1600  piperacillin-tazobactam (ZOSYN) IVPB 3.375 g        3.375 g 12.5 mL/hr over 240 Minutes Intravenous Every 8 hours 03/22/23 1513     03/21/23 2345  metroNIDAZOLE (FLAGYL) IVPB 500 mg        500 mg 100 mL/hr over 60 Minutes Intravenous  Once 03/21/23 2334 03/22/23 0212   03/21/23 2345  cefTRIAXone (ROCEPHIN) 1 g in sodium chloride 0.9 % 100 mL IVPB        1 g 200 mL/hr over 30 Minutes Intravenous  Once 03/21/23 2334 03/22/23 0103   03/21/23 2245  cefTRIAXone (ROCEPHIN) 1 g in sodium chloride 0.9 % 100 mL IVPB        1 g 200 mL/hr over 30 Minutes Intravenous  Once 03/21/23 2235 03/21/23 2329        Assessment/Plan  Perforated appendicitis  POD1 S/P laparoscopic appendectomy  - passing flatus and denies nausea but not much appetite - Encouraged mobilization this AM - continue IV abx while here - will discuss with MD if PO abx needed on DC - recheck this afternoon for possible DC but low threshhold to monitor tonight if not feeling well  FEN: reg diet,  VTE: LMWH ID: [email or web address]   LOS: 0 days     Juliet Rude, [facility] 03/23/2023, 9:36 AM Please see Amion for pager number during day hours 7:00am-4:30pm

## 2023-03-23 NOTE — Progress Notes (Signed)
   03/23/23 1032  Mobility  Activity Ambulated with assistance in hallway  Level of Assistance Minimal assist, patient does 75% or more  Assistive Device  (IV Pole)  Distance Ambulated (ft) 80 ft  Activity Response Tolerated well  Mobility Referral Yes  $Mobility charge 1 Mobility   Mobility Specialist Progress Note  Pt was in bed and agreeable. Had c/o abd pain. Returned EOB w/ all needs met and call bell in reach.   Anastasia Pall Mobility Specialist  Please contact via SecureChat or Rehab office at [phone]

## 2023-03-24 LAB — SURGICAL PATHOLOGY

## 2023-03-24 NOTE — TOC CM/SW Note (Signed)
  Transition of Care [facility]) Screening Note   Patient Details  Name: [name] [name] Date of Birth: [birth year]   Transition of Care [facility]) CM/SW Contact:    Kingsley Plan, RN Phone Number: 03/24/2023, 11:04 AM    Transition of Care Department [facility]) has reviewed patient and no TOC needs have been identified at this time. We will continue to monitor patient advancement through interdisciplinary progression rounds. If new patient transition needs arise, please place a TOC consult.

## 2023-03-24 NOTE — Progress Notes (Signed)
Progress Note  2 Days Post-Op  Subjective: Pt reports more gas pain and lower abdominal pain with getting up. Having diarrhea this AM. Appetite still decreased but she was able to eat about 3/4 a biscuit with honey this AM. Had not been taking pain meds but thinks she may need to to mobilize more this AM.    Objective: Vital signs in last 24 hours: Temp:  [98.7 F (37.1 C)-99.2 F (37.3 C)] 98.8 F (37.1 C) (04/19 0758) Pulse Rate:  [64-83] 73 (04/19 0758) Resp:  [16-17] 16 (04/19 0758) BP: (109-129)/(54-64) 129/64 (04/19 0758) SpO2:  [94 %-97 %] 97 % (04/19 0758)    Intake/Output from previous day: 04/18 0701 - 04/19 0700 In: 220 [P.O.:220] Out: -  Intake/Output this shift: No intake/output data recorded.  PE: General: pleasant, WD, obese female who is laying in bed in NAD Heart: regular, rate, and rhythm.   Lungs: Respiratory effort nonlabored Abd: soft, appropriately ttp, mild-mod distention, incisions C/D/I, +BS Psych: A&Ox3 with an appropriate affect.    Lab Results:  Recent Labs    03/22/23 1700 03/23/23 0124  WBC 6.7 6.3  HGB 11.8* 11.0*  HCT 37.3 33.9*  PLT 175 171    BMET Recent Labs    03/21/23 2206 03/22/23 1700 03/23/23 0124  NA 134*  --  139  K 3.6  --  3.7  CL 101  --  104  CO2 23  --  24  GLUCOSE 137*  --  125*  BUN 14  --  14  CREATININE 0.95 1.02* 1.07*  CALCIUM 9.4  --  8.7*    PT/INR No results for input(s): "LABPROT", "INR" in the last 72 hours. CMP     Component Value Date/Time   NA 139 03/23/2023 0124   K 3.7 03/23/2023 0124   CL 104 03/23/2023 0124   CO2 24 03/23/2023 0124   GLUCOSE 125 (H) 03/23/2023 0124   BUN 14 03/23/2023 0124   CREATININE 1.07 (H) 03/23/2023 0124   CALCIUM 8.7 (L) 03/23/2023 0124   PROT 7.1 03/21/2023 2206   ALBUMIN 4.2 03/21/2023 2206   AST 44 (H) 03/21/2023 2206   ALT 38 03/21/2023 2206   ALKPHOS 65 03/21/2023 2206   BILITOT 1.5 (H) 03/21/2023 2206   GFRNONAA 56 (L) 03/23/2023 0124    GFRAA >60 03/12/2018 1331   Lipase     Component Value Date/Time   LIPASE 26 03/21/2023 2206       Studies/Results: No results found.  Anti-infectives: Anti-infectives (From admission, onward)    Start     Dose/Rate Route Frequency Ordered Stop   03/22/23 1600  piperacillin-tazobactam (ZOSYN) IVPB 3.375 g        3.375 g 12.5 mL/hr over 240 Minutes Intravenous Every 8 hours 03/22/23 1513     03/21/23 2345  metroNIDAZOLE (FLAGYL) IVPB 500 mg        500 mg 100 mL/hr over 60 Minutes Intravenous  Once 03/21/23 2334 03/22/23 0212   03/21/23 2345  cefTRIAXone (ROCEPHIN) 1 g in sodium chloride 0.9 % 100 mL IVPB        1 g 200 mL/hr over 30 Minutes Intravenous  Once 03/21/23 2334 03/22/23 0103   03/21/23 2245  cefTRIAXone (ROCEPHIN) 1 g in sodium chloride 0.9 % 100 mL IVPB        1 g 200 mL/hr over 30 Minutes Intravenous  Once 03/21/23 2235 03/21/23 2329        Assessment/Plan  Perforated appendicitis  POD2 S/P  laparoscopic appendectomy  - having more bowel function but distended and lots of gas cramping - Encouraged mobilization this AM - continue IV abx while here - will DC on Augmentin  - recheck this afternoon for possible DC but low threshhold to monitor tonight if not feeling well  FEN: reg diet, SLIV  VTE: LMWH ID: [email or web address]   LOS: 1 day     Juliet Rude, [facility] 03/24/2023, 9:38 AM Please see Amion for pager number during day hours 7:00am-4:30pm

## 2023-03-24 NOTE — Progress Notes (Signed)
   03/24/23 1100  Mobility  Activity Ambulated with assistance in hallway  Level of Assistance Modified independent, requires aide device or extra time  Assistive Device  (Handrails)  Distance Ambulated (ft) 20 ft  Activity Response RN notified;Tolerated fair  Mobility Referral Yes  $Mobility charge 1 Mobility   Mobility Specialist Progress Note  Pt was in bed and agreeable. Mobility cut short d/t c/o abd pain w/ a rating of 10/10. Left EOB w/ all needs met and call bell in reach.   Anastasia Pall Mobility Specialist  Please contact via SecureChat or Rehab office at [phone]

## 2023-03-25 LAB — CBC
HCT: 35.7 % — ABNORMAL LOW (ref 36.0–46.0)
Hemoglobin: 11.2 g/dL — ABNORMAL LOW (ref 12.0–15.0)
MCH: 30.4 pg (ref 26.0–34.0)
MCHC: 31.4 g/dL (ref 30.0–36.0)
MCV: 96.7 fL (ref 80.0–100.0)
Platelets: 210 10*3/uL (ref 150–400)
RBC: 3.69 MIL/uL — ABNORMAL LOW (ref 3.87–5.11)
RDW: 14.1 % (ref 11.5–15.5)
WBC: 4.2 10*3/uL (ref 4.0–10.5)
nRBC: 0 % (ref 0.0–0.2)

## 2023-03-25 MED ORDER — ACETAMINOPHEN 500 MG PO TABS: 1000.0000 mg | ORAL_TABLET | Freq: Four times a day (QID) | ORAL | Status: DC | PRN

## 2023-03-25 MED ORDER — OXYCODONE HCL 5 MG PO TABS: 5.0000 mg | ORAL_TABLET | Freq: Four times a day (QID) | ORAL | 0 refills | Status: AC | PRN

## 2023-03-25 MED ORDER — AMOXICILLIN-POT CLAVULANATE 875-125 MG PO TABS: 1.0000 | ORAL_TABLET | Freq: Two times a day (BID) | ORAL | 0 refills | Status: AC

## 2023-03-25 NOTE — Discharge Summary (Signed)
[facility] Discharge Summary   Patient ID: [name] [name] MRN: [id number] DOB/AGE: August 14, 1951 72 y.o.  Admit date: 03/21/2023 Discharge date: 03/25/2023  Admitting Diagnosis: Acute appendicitis   Discharge Diagnosis Patient Active Problem List   Diagnosis Date Noted   Appendicitis 03/22/2023   Acute perforated appendicitis 03/22/2023   Status post right knee replacement 12/17/2021   Unilateral primary osteoarthritis, right knee 04/08/2020   CAD (coronary artery disease) s/p DES to RI in 2018 03/26/2020   Status post arthroscopy of right knee 06/13/2019   Essential hypertension 03/31/2017   Left ventricular dysfunction 03/31/2017   Nephrolithiasis 03/31/2017   Mild obesity 03/31/2017   Mixed hyperlipidemia 03/14/2017   NSTEMI (non-ST elevated myocardial infarction) 02/23/2017    Consultants None   Imaging: No results found.  Procedures Dr. Abigail Miyamoto (03/22/23) - Laparoscopic Appendectomy  Hospital Course:  Patient is a 72 year old female who presented to [facility] with abdominal pain.  Workup showed acute appendicitis.  Patient was admitted and underwent procedure listed above.  Tolerated procedure well and was transferred to the floor.  Diet was advanced as tolerated.  On POD3, the patient was voiding well, tolerating diet, ambulating well, pain well controlled, vital signs stable, incisions c/d/i and felt stable for discharge home.  Patient will follow up in our office in 2-3 weeks and knows to call with questions or concerns.  She will call to confirm appointment date/time.    Physical Exam: General:  Alert, NAD, pleasant, comfortable Abd:  Soft, ND, mild tenderness, incisions C/D/I  I or a member of my team have reviewed this patient in the Controlled Substance Database.   Allergies as of 03/25/2023       Reactions   Olmesartan Shortness Of Breath, Nausea Only   Chest pains, indigestion Other reaction(s): GERD/SOB/Chest tightness   Ramipril  Shortness Of Breath, Nausea Only   Chest pains, indigestion Other reaction(s): SOB   Flomax [tamsulosin Hcl]    constipation   Atorvastatin    Other reaction(s): myalgia   Diclofenac    Other reaction(s): GI issues   Mevacor [lovastatin]    myalgias   Sulfa Antibiotics Rash        Medication List     TAKE these medications    acetaminophen 500 MG tablet Commonly known as: TYLENOL Take 2 tablets (1,000 mg total) by mouth every 6 (six) hours as needed for mild pain or fever.   amoxicillin-clavulanate 875-125 MG tablet Commonly known as: AUGMENTIN Take 1 tablet by mouth 2 (two) times daily for 3 days.   aspirin 81 MG chewable tablet CHEW 1 TABLET (81 MG TOTAL) BY MOUTH 2 (TWO) TIMES DAILY. What changed: when to take this   DULoxetine 30 MG capsule Commonly known as: CYMBALTA Take 1 capsule by mouth daily.   fenofibrate 160 MG tablet Take 160 mg by mouth daily.   Glucosamine Sulfate 500 MG Tabs Take 1 tablet by mouth daily.   irbesartan-hydrochlorothiazide 150-12.5 MG tablet Commonly known as: AVALIDE TAKE 1 TABLET BY MOUTH EVERY DAY   levocetirizine 5 MG tablet Commonly known as: XYZAL Take 1 tablet by mouth daily.   Magnesium 400 MG Tabs Take 1 tablet by mouth daily.   metoprolol succinate 25 MG 24 hr tablet Commonly known as: TOPROL-XL TAKE 1 TABLET BY MOUTH EVERY DAY   MULTI VITAMIN DAILY PO Take 1 tablet by mouth daily.   oxyCODONE 5 MG immediate release tablet Commonly known as: Oxy IR/ROXICODONE Take 1 tablet (5 mg total) by  mouth every 6 (six) hours as needed for breakthrough pain.   pantoprazole 40 MG tablet Commonly known as: PROTONIX TAKE 1 TABLET BY MOUTH EVERY DAY   Repatha SureClick 140 MG/ML Soaj Generic drug: Evolocumab INJECT 1 ML INTO THE SKIN EVERY 14 (FOURTEEN) DAYS.   rosuvastatin 20 MG tablet Commonly known as: CRESTOR Take 20 mg by mouth daily.   traMADol 50 MG tablet Commonly known as: ULTRAM Take 1 tablet by mouth  daily.          Follow-up Information     [facility], Hedda Slade, [place]. Go on 04/13/2023.   Specialty: General Surgery Why: 2:15 PM. Please arrive 30 min prior to appointment time and have ID and insurance card with you. Contact information: [address] [place] [address] [facility] [place] [address] [phone]                 Signed: Juliet Rude , [facility] 03/25/2023, 11:19 AM Please see Amion for pager number during day hours 7:00am-4:30pm

## 2023-04-01 ENCOUNTER — Other Ambulatory Visit: Payer: Self-pay | Admitting: Cardiovascular Disease

## 2023-04-03 ENCOUNTER — Other Ambulatory Visit: Payer: Self-pay

## 2023-07-01 ENCOUNTER — Other Ambulatory Visit: Payer: Self-pay | Admitting: Cardiovascular Disease

## 2023-07-01 DIAGNOSIS — I214 Non-ST elevation (NSTEMI) myocardial infarction: Secondary | ICD-10-CM

## 2023-07-01 DIAGNOSIS — I2510 Atherosclerotic heart disease of native coronary artery without angina pectoris: Secondary | ICD-10-CM

## 2023-07-03 DIAGNOSIS — L281 Prurigo nodularis: Secondary | ICD-10-CM | POA: Diagnosis not present

## 2023-07-03 DIAGNOSIS — C44319 Basal cell carcinoma of skin of other parts of face: Secondary | ICD-10-CM | POA: Diagnosis not present

## 2023-07-03 DIAGNOSIS — D485 Neoplasm of uncertain behavior of skin: Secondary | ICD-10-CM | POA: Diagnosis not present

## 2023-07-03 DIAGNOSIS — Z85828 Personal history of other malignant neoplasm of skin: Secondary | ICD-10-CM | POA: Diagnosis not present

## 2023-07-03 DIAGNOSIS — D225 Melanocytic nevi of trunk: Secondary | ICD-10-CM | POA: Diagnosis not present

## 2023-07-03 DIAGNOSIS — L821 Other seborrheic keratosis: Secondary | ICD-10-CM | POA: Diagnosis not present

## 2023-07-03 DIAGNOSIS — D224 Melanocytic nevi of scalp and neck: Secondary | ICD-10-CM | POA: Diagnosis not present

## 2023-07-12 ENCOUNTER — Ambulatory Visit: Payer: PPO | Attending: Cardiovascular Disease | Admitting: Cardiovascular Disease

## 2023-07-12 ENCOUNTER — Encounter: Payer: Self-pay | Admitting: Cardiovascular Disease

## 2023-07-12 VITALS — BP 102/64 | HR 73 | Ht 64.0 in | Wt 192.6 lb

## 2023-07-12 DIAGNOSIS — K219 Gastro-esophageal reflux disease without esophagitis: Secondary | ICD-10-CM | POA: Diagnosis not present

## 2023-07-12 DIAGNOSIS — I10 Essential (primary) hypertension: Secondary | ICD-10-CM

## 2023-07-12 DIAGNOSIS — I2510 Atherosclerotic heart disease of native coronary artery without angina pectoris: Secondary | ICD-10-CM | POA: Diagnosis not present

## 2023-07-12 DIAGNOSIS — E782 Mixed hyperlipidemia: Secondary | ICD-10-CM | POA: Diagnosis not present
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
## 2023-07-12 NOTE — Patient Instructions (Signed)
Medication Instructions:  Your physician has recommended you make the following change in your medication:  1) DECREASE metoprolol to 1/2 tablet daily for 4 days, then STOP taking *If you need a refill on your cardiac medications before your next appointment, please call your pharmacy*  Follow-Up: At [facility], you and your health needs are our priority.  As part of our continuing mission to provide you with exceptional heart care, we have created designated Provider Care Teams.  These Care Teams include your primary Cardiologist (physician) and Advanced Practice Providers (APPs -  Physician Assistants and Nurse Practitioners) who all work together to provide you with the care you need, when you need it.  Your next appointment:   1 year(s)  Provider:   Thurmon Fair, MD

## 2023-08-21 DIAGNOSIS — C44319 Basal cell carcinoma of skin of other parts of face: Secondary | ICD-10-CM | POA: Diagnosis not present

## 2023-08-21 DIAGNOSIS — Z85828 Personal history of other malignant neoplasm of skin: Secondary | ICD-10-CM | POA: Diagnosis not present

## 2023-08-31 DIAGNOSIS — Z85828 Personal history of other malignant neoplasm of skin: Secondary | ICD-10-CM | POA: Diagnosis not present

## 2023-08-31 DIAGNOSIS — D485 Neoplasm of uncertain behavior of skin: Secondary | ICD-10-CM | POA: Diagnosis not present

## 2023-08-31 DIAGNOSIS — L988 Other specified disorders of the skin and subcutaneous tissue: Secondary | ICD-10-CM | POA: Diagnosis not present

## 2023-09-04 ENCOUNTER — Other Ambulatory Visit (INDEPENDENT_AMBULATORY_CARE_PROVIDER_SITE_OTHER): Payer: Self-pay

## 2023-09-04 ENCOUNTER — Ambulatory Visit: Payer: PPO | Admitting: Orthopedic Surgery

## 2023-09-04 ENCOUNTER — Other Ambulatory Visit (INDEPENDENT_AMBULATORY_CARE_PROVIDER_SITE_OTHER): Payer: PPO

## 2023-09-04 DIAGNOSIS — M79642 Pain in left hand: Secondary | ICD-10-CM

## 2023-09-04 DIAGNOSIS — M65342 Trigger finger, left ring finger: Secondary | ICD-10-CM | POA: Diagnosis not present

## 2023-09-04 DIAGNOSIS — M79641 Pain in right hand: Secondary | ICD-10-CM | POA: Diagnosis not present

## 2023-09-04 MED ORDER — LIDOCAINE HCL 1 % IJ SOLN
1.0000 mL | INTRAMUSCULAR | Status: AC | PRN
Start: 2023-09-04 — End: 2023-09-04
  Administered 2023-09-04: 1 mL

## 2023-09-04 MED ORDER — BETAMETHASONE SOD PHOS & ACET 6 (3-3) MG/ML IJ SUSP
6.0000 mg | INTRAMUSCULAR | Status: AC | PRN
Start: 2023-09-04 — End: 2023-09-04
  Administered 2023-09-04: 6 mg via INTRA_ARTICULAR
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
## 2023-09-05 ENCOUNTER — Ambulatory Visit: Payer: PPO | Admitting: Cardiovascular Disease

## 2023-09-11 DIAGNOSIS — R109 Unspecified abdominal pain: Secondary | ICD-10-CM | POA: Diagnosis not present

## 2023-09-11 DIAGNOSIS — N183 Chronic kidney disease, stage 3 unspecified: Secondary | ICD-10-CM | POA: Diagnosis not present

## 2023-09-11 DIAGNOSIS — R252 Cramp and spasm: Secondary | ICD-10-CM | POA: Diagnosis not present

## 2023-09-11 DIAGNOSIS — M1990 Unspecified osteoarthritis, unspecified site: Secondary | ICD-10-CM | POA: Diagnosis not present

## 2023-09-11 DIAGNOSIS — Z0000 Encounter for general adult medical examination without abnormal findings: Secondary | ICD-10-CM | POA: Diagnosis not present

## 2023-09-11 DIAGNOSIS — Z1389 Encounter for screening for other disorder: Secondary | ICD-10-CM | POA: Diagnosis not present

## 2023-09-11 DIAGNOSIS — I129 Hypertensive chronic kidney disease with stage 1 through stage 4 chronic kidney disease, or unspecified chronic kidney disease: Secondary | ICD-10-CM | POA: Diagnosis not present

## 2023-09-11 DIAGNOSIS — N200 Calculus of kidney: Secondary | ICD-10-CM | POA: Diagnosis not present

## 2023-09-11 DIAGNOSIS — I213 ST elevation (STEMI) myocardial infarction of unspecified site: Secondary | ICD-10-CM | POA: Diagnosis not present

## 2023-09-11 DIAGNOSIS — Z23 Encounter for immunization: Secondary | ICD-10-CM | POA: Diagnosis not present

## 2023-09-11 DIAGNOSIS — E785 Hyperlipidemia, unspecified: Secondary | ICD-10-CM | POA: Diagnosis not present

## 2023-09-11 DIAGNOSIS — N1831 Chronic kidney disease, stage 3a: Secondary | ICD-10-CM | POA: Diagnosis not present

## 2023-09-11 DIAGNOSIS — K219 Gastro-esophageal reflux disease without esophagitis: Secondary | ICD-10-CM | POA: Diagnosis not present

## 2023-09-11 DIAGNOSIS — M5380 Other specified dorsopathies, site unspecified: Secondary | ICD-10-CM | POA: Diagnosis not present

## 2023-09-11 DIAGNOSIS — I10 Essential (primary) hypertension: Secondary | ICD-10-CM | POA: Diagnosis not present

## 2023-09-19 DIAGNOSIS — N83202 Unspecified ovarian cyst, left side: Secondary | ICD-10-CM | POA: Diagnosis not present

## 2023-09-19 DIAGNOSIS — Z1272 Encounter for screening for malignant neoplasm of vagina: Secondary | ICD-10-CM | POA: Diagnosis not present

## 2023-09-19 DIAGNOSIS — Z1231 Encounter for screening mammogram for malignant neoplasm of breast: Secondary | ICD-10-CM | POA: Diagnosis not present

## 2023-09-19 DIAGNOSIS — Z124 Encounter for screening for malignant neoplasm of cervix: Secondary | ICD-10-CM | POA: Diagnosis not present

## 2023-09-19 DIAGNOSIS — Z6834 Body mass index (BMI) 34.0-34.9, adult: Secondary | ICD-10-CM | POA: Diagnosis not present

## 2023-10-16 ENCOUNTER — Ambulatory Visit: Payer: PPO | Admitting: Orthopedic Surgery

## 2023-10-16 DIAGNOSIS — M65342 Trigger finger, left ring finger: Secondary | ICD-10-CM
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
## 2023-10-23 DIAGNOSIS — N83202 Unspecified ovarian cyst, left side: Secondary | ICD-10-CM | POA: Diagnosis not present

## 2024-01-22 ENCOUNTER — Other Ambulatory Visit: Payer: Self-pay | Admitting: Cardiovascular Disease

## 2024-03-07 DIAGNOSIS — H43393 Other vitreous opacities, bilateral: Secondary | ICD-10-CM | POA: Diagnosis not present

## 2024-03-07 DIAGNOSIS — H2513 Age-related nuclear cataract, bilateral: Secondary | ICD-10-CM | POA: Diagnosis not present

## 2024-03-07 DIAGNOSIS — H04123 Dry eye syndrome of bilateral lacrimal glands: Secondary | ICD-10-CM | POA: Diagnosis not present

## 2024-03-07 DIAGNOSIS — D3132 Benign neoplasm of left choroid: Secondary | ICD-10-CM | POA: Diagnosis not present

## 2024-03-18 DIAGNOSIS — I129 Hypertensive chronic kidney disease with stage 1 through stage 4 chronic kidney disease, or unspecified chronic kidney disease: Secondary | ICD-10-CM | POA: Diagnosis not present

## 2024-03-18 DIAGNOSIS — Z1212 Encounter for screening for malignant neoplasm of rectum: Secondary | ICD-10-CM | POA: Diagnosis not present

## 2024-03-18 DIAGNOSIS — N1831 Chronic kidney disease, stage 3a: Secondary | ICD-10-CM | POA: Diagnosis not present

## 2024-03-18 DIAGNOSIS — E785 Hyperlipidemia, unspecified: Secondary | ICD-10-CM | POA: Diagnosis not present

## 2024-03-25 DIAGNOSIS — Z0000 Encounter for general adult medical examination without abnormal findings: Secondary | ICD-10-CM | POA: Diagnosis not present

## 2024-03-25 DIAGNOSIS — Z1339 Encounter for screening examination for other mental health and behavioral disorders: Secondary | ICD-10-CM | POA: Diagnosis not present

## 2024-03-25 DIAGNOSIS — M1990 Unspecified osteoarthritis, unspecified site: Secondary | ICD-10-CM | POA: Diagnosis not present

## 2024-03-25 DIAGNOSIS — J302 Other seasonal allergic rhinitis: Secondary | ICD-10-CM | POA: Diagnosis not present

## 2024-03-25 DIAGNOSIS — Z1331 Encounter for screening for depression: Secondary | ICD-10-CM | POA: Diagnosis not present

## 2024-03-25 DIAGNOSIS — E785 Hyperlipidemia, unspecified: Secondary | ICD-10-CM | POA: Diagnosis not present

## 2024-03-25 DIAGNOSIS — R252 Cramp and spasm: Secondary | ICD-10-CM | POA: Diagnosis not present

## 2024-03-25 DIAGNOSIS — I2510 Atherosclerotic heart disease of native coronary artery without angina pectoris: Secondary | ICD-10-CM | POA: Diagnosis not present

## 2024-03-25 DIAGNOSIS — N1831 Chronic kidney disease, stage 3a: Secondary | ICD-10-CM | POA: Diagnosis not present

## 2024-03-25 DIAGNOSIS — I129 Hypertensive chronic kidney disease with stage 1 through stage 4 chronic kidney disease, or unspecified chronic kidney disease: Secondary | ICD-10-CM | POA: Diagnosis not present

## 2024-03-25 DIAGNOSIS — M5380 Other specified dorsopathies, site unspecified: Secondary | ICD-10-CM | POA: Diagnosis not present

## 2024-03-25 DIAGNOSIS — K219 Gastro-esophageal reflux disease without esophagitis: Secondary | ICD-10-CM | POA: Diagnosis not present

## 2024-05-20 ENCOUNTER — Other Ambulatory Visit: Payer: Self-pay | Admitting: Cardiovascular Disease

## 2024-07-11 DIAGNOSIS — D225 Melanocytic nevi of trunk: Secondary | ICD-10-CM | POA: Diagnosis not present

## 2024-07-11 DIAGNOSIS — D2271 Melanocytic nevi of right lower limb, including hip: Secondary | ICD-10-CM | POA: Diagnosis not present

## 2024-07-11 DIAGNOSIS — D1801 Hemangioma of skin and subcutaneous tissue: Secondary | ICD-10-CM | POA: Diagnosis not present

## 2024-07-11 DIAGNOSIS — L821 Other seborrheic keratosis: Secondary | ICD-10-CM | POA: Diagnosis not present

## 2024-07-11 DIAGNOSIS — D485 Neoplasm of uncertain behavior of skin: Secondary | ICD-10-CM | POA: Diagnosis not present

## 2024-07-11 DIAGNOSIS — C44311 Basal cell carcinoma of skin of nose: Secondary | ICD-10-CM | POA: Diagnosis not present

## 2024-07-11 DIAGNOSIS — D2272 Melanocytic nevi of left lower limb, including hip: Secondary | ICD-10-CM | POA: Diagnosis not present

## 2024-07-11 DIAGNOSIS — Z85828 Personal history of other malignant neoplasm of skin: Secondary | ICD-10-CM | POA: Diagnosis not present

## 2024-07-11 DIAGNOSIS — L812 Freckles: Secondary | ICD-10-CM | POA: Diagnosis not present

## 2024-07-14 ENCOUNTER — Other Ambulatory Visit: Payer: Self-pay | Admitting: Cardiovascular Disease

## 2024-07-14 DIAGNOSIS — I214 Non-ST elevation (NSTEMI) myocardial infarction: Secondary | ICD-10-CM

## 2024-07-14 DIAGNOSIS — I2510 Atherosclerotic heart disease of native coronary artery without angina pectoris: Secondary | ICD-10-CM

## 2024-08-15 DIAGNOSIS — C44311 Basal cell carcinoma of skin of nose: Secondary | ICD-10-CM | POA: Diagnosis not present

## 2024-08-15 DIAGNOSIS — Z85828 Personal history of other malignant neoplasm of skin: Secondary | ICD-10-CM | POA: Diagnosis not present

## 2024-09-16 DIAGNOSIS — K635 Polyp of colon: Secondary | ICD-10-CM | POA: Diagnosis not present

## 2024-09-16 DIAGNOSIS — R252 Cramp and spasm: Secondary | ICD-10-CM | POA: Diagnosis not present

## 2024-09-16 DIAGNOSIS — M5380 Other specified dorsopathies, site unspecified: Secondary | ICD-10-CM | POA: Diagnosis not present

## 2024-09-16 DIAGNOSIS — I129 Hypertensive chronic kidney disease with stage 1 through stage 4 chronic kidney disease, or unspecified chronic kidney disease: Secondary | ICD-10-CM | POA: Diagnosis not present

## 2024-09-16 DIAGNOSIS — D72819 Decreased white blood cell count, unspecified: Secondary | ICD-10-CM | POA: Diagnosis not present

## 2024-09-16 DIAGNOSIS — I2510 Atherosclerotic heart disease of native coronary artery without angina pectoris: Secondary | ICD-10-CM | POA: Diagnosis not present

## 2024-09-16 DIAGNOSIS — Z23 Encounter for immunization: Secondary | ICD-10-CM | POA: Diagnosis not present

## 2024-09-16 DIAGNOSIS — N1831 Chronic kidney disease, stage 3a: Secondary | ICD-10-CM | POA: Diagnosis not present

## 2024-09-16 DIAGNOSIS — E785 Hyperlipidemia, unspecified: Secondary | ICD-10-CM | POA: Diagnosis not present

## 2024-09-16 DIAGNOSIS — J302 Other seasonal allergic rhinitis: Secondary | ICD-10-CM | POA: Diagnosis not present

## 2024-09-16 DIAGNOSIS — K219 Gastro-esophageal reflux disease without esophagitis: Secondary | ICD-10-CM | POA: Diagnosis not present

## 2024-09-16 DIAGNOSIS — M1990 Unspecified osteoarthritis, unspecified site: Secondary | ICD-10-CM | POA: Diagnosis not present

## 2024-09-20 ENCOUNTER — Other Ambulatory Visit: Payer: Self-pay | Admitting: Cardiovascular Disease

## 2024-10-01 ENCOUNTER — Other Ambulatory Visit: Payer: Self-pay | Admitting: Cardiovascular Disease

## 2024-10-01 DIAGNOSIS — I2510 Atherosclerotic heart disease of native coronary artery without angina pectoris: Secondary | ICD-10-CM

## 2024-10-01 DIAGNOSIS — I214 Non-ST elevation (NSTEMI) myocardial infarction: Secondary | ICD-10-CM

## 2024-10-07 ENCOUNTER — Encounter: Payer: Self-pay | Admitting: Radiology

## 2024-10-07 ENCOUNTER — Other Ambulatory Visit: Payer: Self-pay | Admitting: Cardiovascular Disease

## 2024-10-11 ENCOUNTER — Other Ambulatory Visit: Payer: Self-pay | Admitting: Cardiovascular Disease

## 2024-10-29 ENCOUNTER — Other Ambulatory Visit: Payer: Self-pay | Admitting: Cardiovascular Disease

## 2024-10-29 DIAGNOSIS — I2510 Atherosclerotic heart disease of native coronary artery without angina pectoris: Secondary | ICD-10-CM

## 2024-10-29 DIAGNOSIS — I214 Non-ST elevation (NSTEMI) myocardial infarction: Secondary | ICD-10-CM

## 2024-11-02 ENCOUNTER — Other Ambulatory Visit: Payer: Self-pay | Admitting: Cardiovascular Disease

## 2024-11-02 DIAGNOSIS — I2510 Atherosclerotic heart disease of native coronary artery without angina pectoris: Secondary | ICD-10-CM

## 2024-11-02 DIAGNOSIS — I214 Non-ST elevation (NSTEMI) myocardial infarction: Secondary | ICD-10-CM

## 2024-11-16 ENCOUNTER — Other Ambulatory Visit: Payer: Self-pay | Admitting: Cardiovascular Disease

## 2024-11-19 MED ORDER — IRBESARTAN-HYDROCHLOROTHIAZIDE 150-12.5 MG PO TABS: 1.0000 | ORAL_TABLET | Freq: Every day | ORAL | 0 refills | Status: AC

## 2024-11-29 ENCOUNTER — Other Ambulatory Visit: Payer: Self-pay | Admitting: Cardiovascular Disease

## 2024-12-02 ENCOUNTER — Other Ambulatory Visit: Payer: Self-pay | Admitting: Cardiovascular Disease

## 2024-12-02 DIAGNOSIS — I214 Non-ST elevation (NSTEMI) myocardial infarction: Secondary | ICD-10-CM

## 2024-12-02 DIAGNOSIS — I2510 Atherosclerotic heart disease of native coronary artery without angina pectoris: Secondary | ICD-10-CM

## 2024-12-02 NOTE — Telephone Encounter (Signed)
 Refill Request.

## 2024-12-06 ENCOUNTER — Other Ambulatory Visit: Payer: Self-pay | Admitting: Cardiovascular Disease

## 2024-12-06 DIAGNOSIS — I214 Non-ST elevation (NSTEMI) myocardial infarction: Secondary | ICD-10-CM

## 2024-12-06 DIAGNOSIS — I2510 Atherosclerotic heart disease of native coronary artery without angina pectoris: Secondary | ICD-10-CM

## 2024-12-09 ENCOUNTER — Other Ambulatory Visit (HOSPITAL_COMMUNITY): Payer: Self-pay

## 2024-12-09 ENCOUNTER — Ambulatory Visit: Attending: Cardiovascular Disease | Admitting: Cardiovascular Disease

## 2024-12-09 ENCOUNTER — Telehealth: Payer: Self-pay | Admitting: Pharmacy Technician

## 2024-12-09 VITALS — BP 140/68 | HR 69 | Ht 64.0 in | Wt 185.6 lb

## 2024-12-09 DIAGNOSIS — I2510 Atherosclerotic heart disease of native coronary artery without angina pectoris: Secondary | ICD-10-CM | POA: Diagnosis not present

## 2024-12-09 DIAGNOSIS — E782 Mixed hyperlipidemia: Secondary | ICD-10-CM | POA: Diagnosis not present

## 2024-12-09 DIAGNOSIS — K219 Gastro-esophageal reflux disease without esophagitis: Secondary | ICD-10-CM | POA: Diagnosis not present

## 2024-12-09 DIAGNOSIS — I10 Essential (primary) hypertension: Secondary | ICD-10-CM

## 2024-12-09 MED ORDER — PANTOPRAZOLE SODIUM 40 MG PO TBEC: 40.0000 mg | DELAYED_RELEASE_TABLET | Freq: Every day | ORAL | 3 refills | Status: AC
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
## 2024-12-09 NOTE — Patient Instructions (Addendum)
 Medication Instructions:  No changes   *If you need a refill on your cardiac medications before your next appointment, please call your pharmacy*   Lab Work: Not needed    Testing/Procedures:  Not needed Please have your primary provider - to do  lab -- Lp( a)  Follow-Up: At [facility], you and your health needs are our priority.  As part of our continuing mission to provide you with exceptional heart care, we have created designated Provider Care Teams.  These Care Teams include your primary Cardiologist (physician) and Advanced Practice Providers (APPs -  Physician Assistants and Nurse Practitioners) who all work together to provide you with the care you need, when you need it.     Your next appointment:   12 month(s)  The format for your next appointment:   In Person  Provider:   Jerel Balding, MD   Other Instructions   Please have your primary provider - to do  lab -- Lp( a)

## 2024-12-09 NOTE — Telephone Encounter (Signed)
 Patient Advocate Encounter   The patient was approved for a [employer] grant that will help cover the cost of Repatha  Total amount awarded, 2500.00.  Effective: 11/09/24 - 11/08/25   APW:[id number] ERW:[id number] Hmnle:[id number] PI:[id number]  [employer] ID: [id number]   Pharmacy provided with approval and processing information. Notified office

## 2024-12-10 NOTE — Telephone Encounter (Signed)
 Called left detail  message on voicemail - patient has been granted [employer] grant until 11/08/25.  The information has been sent patient's local pharmacy any question may call back

## 2024-12-18 ENCOUNTER — Telehealth: Payer: Self-pay | Admitting: Cardiovascular Disease

## 2024-12-18 NOTE — Telephone Encounter (Signed)
 Pt would like a c/b about recent pharmacy grant. Pt states pharmacy is still charging for medication, please advise.

## 2025-01-16 ENCOUNTER — Ambulatory Visit: Admitting: Physician Assistant
